# Patient Record
Sex: Female | Born: 1955 | Race: Black or African American | Hispanic: No | Smoking: Former smoker
Health system: Southern US, Community
[De-identification: ages and names within clinical notes are randomized; demographics above are authoritative.]

## PROBLEM LIST (undated history)

## (undated) DIAGNOSIS — I10 Essential (primary) hypertension: Secondary | ICD-10-CM

## (undated) DIAGNOSIS — I200 Unstable angina: Secondary | ICD-10-CM

## (undated) DIAGNOSIS — H409 Unspecified glaucoma: Secondary | ICD-10-CM

## (undated) DIAGNOSIS — N19 Unspecified kidney failure: Secondary | ICD-10-CM

## (undated) DIAGNOSIS — E1169 Type 2 diabetes mellitus with other specified complication: Secondary | ICD-10-CM

## (undated) DIAGNOSIS — H5440 Blindness, one eye, unspecified eye: Secondary | ICD-10-CM

## (undated) DIAGNOSIS — I219 Acute myocardial infarction, unspecified: Secondary | ICD-10-CM

## (undated) DIAGNOSIS — I119 Hypertensive heart disease without heart failure: Secondary | ICD-10-CM

## (undated) DIAGNOSIS — E785 Hyperlipidemia, unspecified: Secondary | ICD-10-CM

## (undated) DIAGNOSIS — E669 Obesity, unspecified: Secondary | ICD-10-CM

## (undated) DIAGNOSIS — I509 Heart failure, unspecified: Secondary | ICD-10-CM

## (undated) HISTORY — PX: CHOLECYSTECTOMY: SHX55

## (undated) HISTORY — PX: EYE SURGERY: SHX253

## (undated) HISTORY — PX: TONSILLECTOMY: SUR1361

## (undated) HISTORY — PX: ABDOMINAL HYSTERECTOMY: SHX81

## (undated) HISTORY — PX: CATARACT EXTRACTION: SUR2

## (undated) HISTORY — DX: Unspecified kidney failure: N19

## (undated) HISTORY — PX: TRIGGER FINGER RELEASE: SHX641

---

## 2008-10-28 ENCOUNTER — Emergency Department (HOSPITAL_BASED_OUTPATIENT_CLINIC_OR_DEPARTMENT_OTHER): Admission: EM | Admit: 2008-10-28 | Discharge: 2008-10-28 | Payer: Self-pay | Admitting: Emergency Medicine

## 2009-02-26 ENCOUNTER — Emergency Department (HOSPITAL_BASED_OUTPATIENT_CLINIC_OR_DEPARTMENT_OTHER): Admission: EM | Admit: 2009-02-26 | Discharge: 2009-02-26 | Payer: Self-pay | Admitting: Emergency Medicine

## 2009-06-10 HISTORY — PX: KIDNEY TRANSPLANT: SHX239

## 2009-08-17 ENCOUNTER — Ambulatory Visit: Payer: Self-pay | Admitting: Diagnostic Radiology

## 2009-08-17 ENCOUNTER — Emergency Department (HOSPITAL_BASED_OUTPATIENT_CLINIC_OR_DEPARTMENT_OTHER): Admission: EM | Admit: 2009-08-17 | Discharge: 2009-08-17 | Payer: Self-pay | Admitting: Emergency Medicine

## 2010-09-03 LAB — BASIC METABOLIC PANEL
BUN: 59 mg/dL — ABNORMAL HIGH (ref 6–23)
Calcium: 9.1 mg/dL (ref 8.4–10.5)
Chloride: 104 mEq/L (ref 96–112)
Creatinine, Ser: 17.4 mg/dL — ABNORMAL HIGH (ref 0.4–1.2)
GFR calc non Af Amer: 2 mL/min — ABNORMAL LOW (ref >60)
Potassium: 4.1 mEq/L (ref 3.5–5.1)
Sodium: 148 mEq/L — ABNORMAL HIGH (ref 135–145)

## 2010-09-03 LAB — DIFFERENTIAL
Basophils Absolute: 0 10*3/uL (ref 0.0–0.1)
Lymphocytes Relative: 22 % (ref 12–46)
Lymphs Abs: 2 10*3/uL (ref 0.7–4.0)
Monocytes Absolute: 0.6 10*3/uL (ref 0.1–1.0)
Neutro Abs: 6.2 10*3/uL (ref 1.7–7.7)

## 2010-09-03 LAB — CBC
MCHC: 34.5 g/dL (ref 30.0–36.0)
RBC: 3.33 MIL/uL — ABNORMAL LOW (ref 3.87–5.11)
RDW: 17.3 % — ABNORMAL HIGH (ref 11.5–15.5)
WBC: 9.1 10*3/uL (ref 4.0–10.5)

## 2010-09-14 LAB — URINALYSIS, ROUTINE W REFLEX MICROSCOPIC
Bilirubin Urine: NEGATIVE
Glucose, UA: 250 mg/dL — AB
Nitrite: NEGATIVE
Protein, ur: >300 mg/dL — AB
Urobilinogen, UA: 0.2 mg/dL (ref 0.0–1.0)

## 2010-09-14 LAB — URINE MICROSCOPIC-ADD ON

## 2011-03-30 ENCOUNTER — Emergency Department (HOSPITAL_BASED_OUTPATIENT_CLINIC_OR_DEPARTMENT_OTHER)
Admission: EM | Admit: 2011-03-30 | Discharge: 2011-03-30 | Disposition: A | Discharge status: Home / Self Care | Payer: Medicare Other | Attending: Emergency Medicine | Admitting: Emergency Medicine

## 2011-03-30 ENCOUNTER — Encounter: Payer: Self-pay | Admitting: Emergency Medicine

## 2011-03-30 DIAGNOSIS — E119 Type 2 diabetes mellitus without complications: Secondary | ICD-10-CM | POA: Insufficient documentation

## 2011-03-30 DIAGNOSIS — I10 Essential (primary) hypertension: Secondary | ICD-10-CM | POA: Insufficient documentation

## 2011-03-30 DIAGNOSIS — M5430 Sciatica, unspecified side: Secondary | ICD-10-CM

## 2011-03-30 HISTORY — DX: Blindness, one eye, unspecified eye: H54.40

## 2011-03-30 HISTORY — DX: Essential (primary) hypertension: I10

## 2011-03-30 MED ORDER — GABAPENTIN 300 MG PO CAPS: 300.0000 mg | ORAL_CAPSULE | Freq: Three times a day (TID) | ORAL | Status: DC

## 2011-03-30 NOTE — ED Provider Notes (Signed)
History     CSN: [id number] Arrival date & time: 03/30/2011  1:25 PM   First MD Initiated Contact with Patient 03/30/11 1326      Chief Complaint  Patient presents with  . Leg Pain    (Consider location/radiation/quality/duration/timing/severity/associated sxs/prior treatment) HPI Comments: Pt states that she was treated with gabapentin and it helped, but she is out of the medication  Patient is a 55 y.o. female presenting with leg pain. The history is provided by the patient. No language interpreter was used.  Leg Pain  The incident occurred more than 1 week ago. The injury mechanism is unknown. Pain location: right buttock down to the knee. The quality of the pain is described as throbbing. The pain is moderate. The pain has been intermittent since onset. Pertinent negatives include no numbness, no loss of motion and no tingling. She reports no foreign bodies present. The symptoms are aggravated by palpation.    Past Medical History  Diagnosis Date  . Renal disorder     kidney transplant  . Hypertension   . Diabetes mellitus   . Blind left eye   . Glaucoma     Past Surgical History  Procedure Date  . Nephrectomy transplanted organ 10/2010  . Cholecystectomy   . Abdominal hysterectomy   . Tonsillectomy   . Eye surgery     cataract    History reviewed. No pertinent family history.  History  Substance Use Topics  . Smoking status: Never Smoker   . Smokeless tobacco: Not on file  . Alcohol Use: No    OB History    Grav Para Term Preterm Abortions TAB SAB Ect Mult Living                  Review of Systems  Neurological: Negative for tingling and numbness.  All other systems reviewed and are negative.    Allergies  Iron and Wellbutrin  Home Medications   Current Outpatient Rx  Name Route Sig Dispense Refill  . ASPIRIN 81 MG PO CHEW Oral Chew 81 mg by mouth daily.      . INSULIN ASPART 100 UNIT/ML [place] SOLN Subcutaneous Inject into the skin 3 (three)  times daily before meals.      . INSULIN GLARGINE 100 UNIT/ML [place] SOLN Subcutaneous Inject into the skin at bedtime.      . ISOSORBIDE MONONITRATE 20 MG PO TABS Oral Take 20 mg by mouth 3 (three) times daily.      Marland Kitchen LABETALOL HCL 300 MG PO TABS Oral Take 300 mg by mouth 3 (three) times daily.      Marland Kitchen MYCOPHENOLATE SODIUM 180 MG PO TBEC Oral Take 180 mg by mouth 4 (four) times daily.      Marland Kitchen PANTOPRAZOLE SODIUM 20 MG PO TBEC Oral Take 20 mg by mouth daily.      . SODIUM BICARBONATE 325 MG PO TABS Oral Take 325 mg by mouth 4 (four) times daily.      . SUCRALFATE 1 G PO TABS Oral Take 1 g by mouth 4 (four) times daily.      . SULFAMETHOXAZOLE-TRIMETHOPRIM 400-80 MG PO TABS Oral Take 1 tablet by mouth 2 (two) times a week.      Marland Kitchen TACROLIMUS 1 MG PO CAPS Oral Take 6 mg by mouth 2 (two) times daily.      Marland Kitchen GABAPENTIN 300 MG PO CAPS Oral Take 1 capsule (300 mg total) by mouth 3 (three) times daily. 20 capsule 0  BP 155/58  Pulse 83  Temp(Src) 98.6 F (37 C) (Oral)  Resp 22  Ht 5\' 4"  (1.626 m)  Wt 220 lb (99.791 kg)  BMI 37.76 kg/m2  SpO2 100%  Physical Exam  Nursing note and vitals reviewed. Constitutional: She is oriented to person, place, and time. She appears well-developed and well-nourished.  Cardiovascular: Normal rate and regular rhythm.   Pulmonary/Chest: Effort normal and breath sounds normal.  Musculoskeletal: Normal range of motion.       Pt tender in the right sciatic notch  Neurological: She is alert and oriented to person, place, and time.  Skin: Skin is warm and dry.    ED Course  Procedures (including critical care time)  Labs Reviewed - No data to display No results found.   1. Sciatica       MDM  Exam consistent with sciatica:pt was getting results with gabapentin:will continue to give for pain:pt given referral         Glendell Docker, NP 03/30/11 1403

## 2011-03-30 NOTE — ED Notes (Signed)
Pt states she was treated for migraine approximately one month ago with bilateral injections.  Pt relates that her right leg has been hurting from injection site down to knee ever since.  Pt seen at Urgent care approximately two weeks ago.  Pt is out of medications, and wants to be checked again.

## 2011-03-30 NOTE — ED Provider Notes (Signed)
History/physical exam/procedure(s) were performed by non-physician practitioner and as supervising physician I was immediately available for consultation/collaboration. I have reviewed all notes and am in agreement with care and plan.   Shaune Pollack, MD 03/30/11 [phone]

## 2011-04-02 ENCOUNTER — Encounter: Payer: Self-pay | Admitting: Family Medicine

## 2011-04-02 ENCOUNTER — Ambulatory Visit (INDEPENDENT_AMBULATORY_CARE_PROVIDER_SITE_OTHER): Payer: Medicare Other | Admitting: Family Medicine

## 2011-04-02 VITALS — BP 177/79 | HR 94 | Temp 98.4°F | Ht 64.0 in | Wt 220.0 lb

## 2011-04-02 DIAGNOSIS — M549 Dorsalgia, unspecified: Secondary | ICD-10-CM

## 2011-04-02 DIAGNOSIS — M545 Low back pain: Secondary | ICD-10-CM

## 2011-04-02 MED ORDER — ACETAMINOPHEN-CODEINE #3 300-30 MG PO TABS: 1.0000 | ORAL_TABLET | Freq: Four times a day (QID) | ORAL | Status: AC | PRN

## 2011-04-02 MED ORDER — GABAPENTIN 300 MG PO CAPS: 300.0000 mg | ORAL_CAPSULE | Freq: Three times a day (TID) | ORAL | Status: DC

## 2011-04-02 NOTE — Patient Instructions (Signed)
Your exam is consistent with sciatica with piriformis spasms vs lumbar radiculopathy (pinched nerve in your back). Your medication options are limited with your stomach and kidney history. We will move forward with an MRI of your lumbar spine to further assess since you haven't gotten better with usual measures and pain is severe. Try Tylenol with codeine for pain relief. Take neurontin 2 caps three times a day for nerve pain. Prednisone is an option but this should be prescribed by your kidney physician only after discussion of risks. Flexeril as needed for muscle spasms (no driving on this medicine). Stay as active as possible. Physical therapy has been shown to be helpful as well - start this while we're waiting on MRI results to help break up spasms, stretch out area, and decrease pain. I will call you with the MRI results.

## 2011-04-04 ENCOUNTER — Encounter: Payer: Self-pay | Admitting: Family Medicine

## 2011-04-04 DIAGNOSIS — M545 Low back pain: Secondary | ICD-10-CM | POA: Insufficient documentation

## 2011-04-04 NOTE — Progress Notes (Signed)
Subjective:    Patient ID: April Pena, female    DOB: 02/23/1956, 55 y.o.   MRN: [id number]  PCP: Dr. Ellouise Newer  HPI 55 yo F here for 1 month of low back pain.  Patient reports pain started about 1 month ago when she received an IM injection in right buttock for a migraine. States since then has had fairly persistent pain in right side low back/buttock radiating down posterior leg to knee. Pain also goes into groin and across back. Saw PCP for this and given gabapentin which helped. Taking tylenol as well. Heating pad makes this ache more. No numbness or tingling with the pain. Options limited 2/2 being s/p kidney transplant and on anti-rejection drugs. She runs all medications by her kidney specialist. Pain is 9/10. No bowel/bladder dysfunction. No prior back issues.  Past Medical History  Diagnosis Date  . Renal disorder     kidney transplant  . Hypertension   . Diabetes mellitus   . Blind left eye   . Glaucoma     Current Outpatient Prescriptions on File Prior to Visit  Medication Sig Dispense Refill  . acetaminophen (TYLENOL) 500 MG tablet Take 500 mg by mouth every 6 (six) hours as needed. As needed for pain       . aspirin 81 MG chewable tablet Chew 81 mg by mouth daily.       . insulin aspart (NOVOLOG) 100 UNIT/ML injection Inject into the skin 3 (three) times daily before meals. New to pt unsure about scale      . insulin glargine (LANTUS) 100 UNIT/ML injection Inject into the skin at bedtime.       . isosorbide mononitrate (IMDUR) 60 MG 24 hr tablet Take 60 mg by mouth daily.        . isosorbide mononitrate (ISMO,MONOKET) 20 MG tablet Take 20 mg by mouth 3 (three) times daily.       Marland Kitchen labetalol (NORMODYNE) 300 MG tablet Take 300 mg by mouth 3 (three) times daily. Morning (930  Noon and at 5:30      . magnesium oxide (MAG-OX) 400 MG tablet Take 400 mg by mouth 2 (two) times daily.        . mycophenolate (MYFORTIC) 180 MG EC tablet Take 180 mg by mouth 4 (four)  times daily.       . pantoprazole (PROTONIX) 20 MG tablet Take 20 mg by mouth daily.       . predniSONE (DELTASONE) 5 MG tablet Take 5 mg by mouth daily.        . QUEtiapine (SEROQUEL) 25 MG tablet Take 25 mg by mouth at bedtime.        . sodium bicarbonate 325 MG tablet Take 325 mg by mouth 2 (two) times daily.       . sucralfate (CARAFATE) 1 G tablet Take 1 g by mouth 2 (two) times daily.       Marland Kitchen sulfamethoxazole-trimethoprim (BACTRIM,SEPTRA) 400-80 MG per tablet Take 1 tablet by mouth 3 (three) times a week. Takes on Mon Wed and Friday      . tacrolimus (PROGRAF) 1 MG capsule Take 6 mg by mouth daily with breakfast.       . tacrolimus (PROGRAF) 1 MG capsule Take 5 mg by mouth at bedtime.        . timolol (BETIMOL) 0.5 % ophthalmic solution Place 2 drops into the right eye 2 (two) times daily.        . traZODone (DESYREL) 100 MG tablet  Take 100 mg by mouth at bedtime.          Past Surgical History  Procedure Date  . Nephrectomy transplanted organ 10/2010  . Cholecystectomy   . Abdominal hysterectomy   . Tonsillectomy   . Eye surgery     cataract  . Kidney transplant     Allergies  Allergen Reactions  . Iron Hives and Other (See Comments)    Elevated blood sugar, glaucoma worsened,   . Wellbutrin (Bupropion Hcl) Nausea And Vomiting    History   Social History  . Marital Status: Divorced    Spouse Name: N/A    Number of Children: N/A  . Years of Education: N/A   Occupational History  . Not on file.   Social History Main Topics  . Smoking status: Never Smoker   . Smokeless tobacco: Not on file  . Alcohol Use: No  . Drug Use: Not on file  . Sexually Active: Not on file   Other Topics Concern  . Not on file   Social History Narrative  . No narrative on file    Family History  Problem Relation Age of Onset  . Diabetes Mother   . Hypertension Mother   . Heart attack Mother   . Diabetes Father   . Hypertension Sister   . Diabetes Brother   . Hypertension  Brother   . Diabetes Paternal Grandmother   . Hyperlipidemia Neg Hx   . Sudden death Neg Hx   . Diabetes Daughter   . Hypertension Daughter     BP 177/79  Pulse 94  Temp(Src) 98.4 F (36.9 C) (Oral)  Ht 5\' 4"  (1.626 m)  Wt 220 lb (99.791 kg)  BMI 37.76 kg/m2  Review of Systems See HPI above.    Objective:   Physical Exam Gen: NAD  Back: No gross deformity, scoliosis. TTP right lumbar paraspinal region, buttock, and at piriformis.  No midline or bony TTP. FROM with pain on full flexion. Strength 4/5 with right hip flexion and knee extension.  Otherwise 5/5 all lower extremity muscle groups.  2+ MSRs in patellar and achilles tendons, equal bilaterally. Positive SLR on right. Sensation intact to light touch bilaterally. Negative logroll bilateral hips Mild pain with right piriformis stretch.  Negative fabers.    Assessment & Plan:  1. Low back pain - Patient reports pain started with IM injection a month ago but she has symptoms more indicative of lumbar radiculopathy and the area where she was injected was in upper outer quadrant of right buttock, not near piriformis/sciatic nerve - believe this is most likely coincidental.  Will treat for lumbar radiculopathy but also do PT for piriformis spasms/sciatica.  Refill neurontin but increase dosage to 600 tid.  Try tylenol with codeine instead of her usual percocet - stressed that she should not take both at same time but she should choose between them.  She has a Equities trader with another physician - discussed this and she said it would not be a violation of her contract.  We do not refill these narcotic pain medications however.  Severity of pain along with muscle weakness is concerning - will move forward with MRI of lumbar spine to further assess.  While she could do prednisone higher dose, advised her she should run this by her renal specialist first.  Start flexeril as needed for spasms.

## 2011-04-04 NOTE — Assessment & Plan Note (Signed)
Patient reports pain started with IM injection a month ago but she has symptoms more indicative of lumbar radiculopathy and the area where she was injected was in upper outer quadrant of right buttock, not near piriformis/sciatic nerve - believe this is most likely coincidental.  Will treat for lumbar radiculopathy but also do PT for piriformis spasms/sciatica.  Refill neurontin but increase dosage to 600 tid.  Try tylenol with codeine instead of her usual percocet - stressed that she should not take both at same time but she should choose between them.  She has a Equities trader with another physician - discussed this and she said it would not be a violation of her contract.  We do not refill these narcotic pain medications however.  Severity of pain along with muscle weakness is concerning - will move forward with MRI of lumbar spine to further assess.  While she could do prednisone higher dose, advised her she should run this by her renal specialist first.  Start flexeril as needed for spasms.

## 2011-04-06 ENCOUNTER — Other Ambulatory Visit (HOSPITAL_BASED_OUTPATIENT_CLINIC_OR_DEPARTMENT_OTHER): Payer: Medicare Other

## 2011-04-08 ENCOUNTER — Ambulatory Visit: Payer: Medicare Other | Attending: Family Medicine | Admitting: Physical Therapy

## 2011-04-08 DIAGNOSIS — M25559 Pain in unspecified hip: Secondary | ICD-10-CM | POA: Insufficient documentation

## 2011-04-08 DIAGNOSIS — IMO0001 Reserved for inherently not codable concepts without codable children: Secondary | ICD-10-CM | POA: Insufficient documentation

## 2011-04-08 DIAGNOSIS — M2569 Stiffness of other specified joint, not elsewhere classified: Secondary | ICD-10-CM | POA: Insufficient documentation

## 2016-04-07 ENCOUNTER — Encounter (HOSPITAL_BASED_OUTPATIENT_CLINIC_OR_DEPARTMENT_OTHER): Payer: Self-pay | Admitting: *Deleted

## 2016-04-07 ENCOUNTER — Inpatient Hospital Stay (HOSPITAL_BASED_OUTPATIENT_CLINIC_OR_DEPARTMENT_OTHER)
Admission: EM | Admit: 2016-04-07 | Discharge: 2016-04-19 | DRG: 234 | Disposition: A | Discharge status: Home / Self Care | Payer: Medicaid Other | Attending: Surgery | Admitting: Surgery

## 2016-04-07 ENCOUNTER — Emergency Department (HOSPITAL_BASED_OUTPATIENT_CLINIC_OR_DEPARTMENT_OTHER): Payer: Medicaid Other

## 2016-04-07 DIAGNOSIS — N183 Chronic kidney disease, stage 3 unspecified: Secondary | ICD-10-CM | POA: Diagnosis present

## 2016-04-07 DIAGNOSIS — E1122 Type 2 diabetes mellitus with diabetic chronic kidney disease: Secondary | ICD-10-CM | POA: Diagnosis present

## 2016-04-07 DIAGNOSIS — R7989 Other specified abnormal findings of blood chemistry: Secondary | ICD-10-CM

## 2016-04-07 DIAGNOSIS — Z951 Presence of aortocoronary bypass graft: Secondary | ICD-10-CM

## 2016-04-07 DIAGNOSIS — S3012XA Contusion of groin, initial encounter: Secondary | ICD-10-CM | POA: Diagnosis not present

## 2016-04-07 DIAGNOSIS — E1143 Type 2 diabetes mellitus with diabetic autonomic (poly)neuropathy: Secondary | ICD-10-CM | POA: Diagnosis present

## 2016-04-07 DIAGNOSIS — R778 Other specified abnormalities of plasma proteins: Secondary | ICD-10-CM

## 2016-04-07 DIAGNOSIS — I97630 Postprocedural hematoma of a circulatory system organ or structure following a cardiac catheterization: Secondary | ICD-10-CM | POA: Diagnosis not present

## 2016-04-07 DIAGNOSIS — M549 Dorsalgia, unspecified: Secondary | ICD-10-CM

## 2016-04-07 DIAGNOSIS — I2582 Chronic total occlusion of coronary artery: Secondary | ICD-10-CM | POA: Diagnosis present

## 2016-04-07 DIAGNOSIS — Z794 Long term (current) use of insulin: Secondary | ICD-10-CM

## 2016-04-07 DIAGNOSIS — E119 Type 2 diabetes mellitus without complications: Secondary | ICD-10-CM | POA: Diagnosis present

## 2016-04-07 DIAGNOSIS — I2510 Atherosclerotic heart disease of native coronary artery without angina pectoris: Secondary | ICD-10-CM

## 2016-04-07 DIAGNOSIS — K3184 Gastroparesis: Secondary | ICD-10-CM | POA: Diagnosis present

## 2016-04-07 DIAGNOSIS — I252 Old myocardial infarction: Secondary | ICD-10-CM

## 2016-04-07 DIAGNOSIS — I2584 Coronary atherosclerosis due to calcified coronary lesion: Secondary | ICD-10-CM | POA: Diagnosis present

## 2016-04-07 DIAGNOSIS — I214 Non-ST elevation (NSTEMI) myocardial infarction: Principal | ICD-10-CM | POA: Diagnosis present

## 2016-04-07 DIAGNOSIS — E1169 Type 2 diabetes mellitus with other specified complication: Secondary | ICD-10-CM | POA: Diagnosis present

## 2016-04-07 DIAGNOSIS — H409 Unspecified glaucoma: Secondary | ICD-10-CM | POA: Diagnosis present

## 2016-04-07 DIAGNOSIS — R079 Chest pain, unspecified: Secondary | ICD-10-CM

## 2016-04-07 DIAGNOSIS — N289 Disorder of kidney and ureter, unspecified: Secondary | ICD-10-CM

## 2016-04-07 DIAGNOSIS — Z87891 Personal history of nicotine dependence: Secondary | ICD-10-CM

## 2016-04-07 DIAGNOSIS — N189 Chronic kidney disease, unspecified: Secondary | ICD-10-CM

## 2016-04-07 DIAGNOSIS — E871 Hypo-osmolality and hyponatremia: Secondary | ICD-10-CM | POA: Diagnosis not present

## 2016-04-07 DIAGNOSIS — Z79899 Other long term (current) drug therapy: Secondary | ICD-10-CM

## 2016-04-07 DIAGNOSIS — Z7982 Long term (current) use of aspirin: Secondary | ICD-10-CM

## 2016-04-07 DIAGNOSIS — Z940 Kidney transplant status: Secondary | ICD-10-CM

## 2016-04-07 DIAGNOSIS — H5462 Unqualified visual loss, left eye, normal vision right eye: Secondary | ICD-10-CM | POA: Diagnosis present

## 2016-04-07 DIAGNOSIS — Y848 Other medical procedures as the cause of abnormal reaction of the patient, or of later complication, without mention of misadventure at the time of the procedure: Secondary | ICD-10-CM | POA: Diagnosis not present

## 2016-04-07 DIAGNOSIS — Z6835 Body mass index (BMI) 35.0-35.9, adult: Secondary | ICD-10-CM

## 2016-04-07 DIAGNOSIS — E11649 Type 2 diabetes mellitus with hypoglycemia without coma: Secondary | ICD-10-CM | POA: Diagnosis not present

## 2016-04-07 DIAGNOSIS — E785 Hyperlipidemia, unspecified: Secondary | ICD-10-CM | POA: Diagnosis present

## 2016-04-07 DIAGNOSIS — I130 Hypertensive heart and chronic kidney disease with heart failure and stage 1 through stage 4 chronic kidney disease, or unspecified chronic kidney disease: Secondary | ICD-10-CM | POA: Diagnosis present

## 2016-04-07 DIAGNOSIS — E669 Obesity, unspecified: Secondary | ICD-10-CM | POA: Diagnosis present

## 2016-04-07 DIAGNOSIS — E860 Dehydration: Secondary | ICD-10-CM | POA: Diagnosis not present

## 2016-04-07 DIAGNOSIS — Z7952 Long term (current) use of systemic steroids: Secondary | ICD-10-CM

## 2016-04-07 DIAGNOSIS — S301XXA Contusion of abdominal wall, initial encounter: Secondary | ICD-10-CM | POA: Diagnosis not present

## 2016-04-07 DIAGNOSIS — I10 Essential (primary) hypertension: Secondary | ICD-10-CM | POA: Diagnosis present

## 2016-04-07 DIAGNOSIS — K5900 Constipation, unspecified: Secondary | ICD-10-CM | POA: Diagnosis not present

## 2016-04-07 DIAGNOSIS — I509 Heart failure, unspecified: Secondary | ICD-10-CM | POA: Diagnosis present

## 2016-04-07 HISTORY — DX: Acute myocardial infarction, unspecified: I21.9

## 2016-04-07 HISTORY — DX: Unspecified glaucoma: H40.9

## 2016-04-07 HISTORY — DX: Hypertensive heart disease without heart failure: I11.9

## 2016-04-07 HISTORY — DX: Obesity, unspecified: E66.9

## 2016-04-07 HISTORY — DX: Type 2 diabetes mellitus with other specified complication: E11.69

## 2016-04-07 HISTORY — DX: Hyperlipidemia, unspecified: E78.5

## 2016-04-07 HISTORY — DX: Heart failure, unspecified: I50.9

## 2016-04-07 HISTORY — DX: Unstable angina: I20.0

## 2016-04-07 LAB — GLUCOSE, CAPILLARY
GLUCOSE-CAPILLARY: 164 mg/dL — AB (ref 65–99)
Glucose-Capillary: 367 mg/dL — ABNORMAL HIGH (ref 65–99)

## 2016-04-07 LAB — HEPARIN LEVEL (UNFRACTIONATED): HEPARIN UNFRACTIONATED: 0.55 [IU]/mL (ref 0.30–0.70)

## 2016-04-07 LAB — CBC WITH DIFFERENTIAL/PLATELET
Basophils Absolute: 0 10*3/uL (ref 0.0–0.1)
Basophils Relative: 0 %
EOS ABS: 0.1 10*3/uL (ref 0.0–0.7)
Eosinophils Relative: 2 %
HCT: 33.9 % — ABNORMAL LOW (ref 36.0–46.0)
HEMOGLOBIN: 10.7 g/dL — AB (ref 12.0–15.0)
LYMPHS ABS: 0.7 10*3/uL (ref 0.7–4.0)
Lymphocytes Relative: 15 %
MCH: 28 pg (ref 26.0–34.0)
MCHC: 31.6 g/dL (ref 30.0–36.0)
MCV: 88.7 fL (ref 78.0–100.0)
MONO ABS: 0.5 10*3/uL (ref 0.1–1.0)
MONOS PCT: 11 %
NEUTROS PCT: 72 %
Neutro Abs: 3.4 10*3/uL (ref 1.7–7.7)
Platelets: 222 10*3/uL (ref 150–400)
RBC: 3.82 MIL/uL — ABNORMAL LOW (ref 3.87–5.11)
RDW: 13.6 % (ref 11.5–15.5)
WBC: 4.8 10*3/uL (ref 4.0–10.5)

## 2016-04-07 LAB — BASIC METABOLIC PANEL
Anion gap: 8 (ref 5–15)
BUN: 36 mg/dL — AB (ref 6–20)
CALCIUM: 9.2 mg/dL (ref 8.9–10.3)
CHLORIDE: 108 mmol/L (ref 101–111)
CO2: 21 mmol/L — AB (ref 22–32)
CREATININE: 1.85 mg/dL — AB (ref 0.44–1.00)
GFR calc Af Amer: 33 mL/min — ABNORMAL LOW (ref >60)
GFR calc non Af Amer: 29 mL/min — ABNORMAL LOW (ref >60)
Glucose, Bld: 248 mg/dL — ABNORMAL HIGH (ref 65–99)
Potassium: 3.7 mmol/L (ref 3.5–5.1)
SODIUM: 137 mmol/L (ref 135–145)

## 2016-04-07 LAB — TROPONIN I
TROPONIN I: 3.32 ng/mL — AB (ref <0.03)
Troponin I: 0.73 ng/mL (ref <0.03)
Troponin I: 3.72 ng/mL (ref <0.03)

## 2016-04-07 LAB — CBG MONITORING, ED: Glucose-Capillary: 56 mg/dL — ABNORMAL LOW (ref 65–99)

## 2016-04-07 LAB — PROTIME-INR
INR: 1.02
Prothrombin Time: 13.4 seconds (ref 11.4–15.2)

## 2016-04-07 LAB — MRSA PCR SCREENING: MRSA by PCR: NEGATIVE

## 2016-04-07 MED ORDER — INSULIN ASPART 100 UNIT/ML ~~LOC~~ SOLN
10.0000 [IU] | Freq: Three times a day (TID) | SUBCUTANEOUS | Status: DC
  Administered 2016-04-08 – 2016-04-09 (×4): 10 [IU] via SUBCUTANEOUS

## 2016-04-07 MED ORDER — CLOPIDOGREL BISULFATE 75 MG PO TABS
75.0000 mg | ORAL_TABLET | Freq: Every day | ORAL | Status: DC
  Administered 2016-04-08: 75 mg via ORAL
  Filled 2016-04-07: qty 1

## 2016-04-07 MED ORDER — SODIUM BICARBONATE 650 MG PO TABS
650.0000 mg | ORAL_TABLET | Freq: Two times a day (BID) | ORAL | Status: DC
  Administered 2016-04-07 – 2016-04-12 (×10): 650 mg via ORAL
  Filled 2016-04-07 (×10): qty 1

## 2016-04-07 MED ORDER — ACETAMINOPHEN 325 MG PO TABS: 650.0000 mg | ORAL_TABLET | ORAL | Status: DC | PRN

## 2016-04-07 MED ORDER — PANTOPRAZOLE SODIUM 40 MG PO TBEC
40.0000 mg | DELAYED_RELEASE_TABLET | Freq: Every day | ORAL | Status: DC
  Administered 2016-04-09 – 2016-04-12 (×4): 40 mg via ORAL
  Filled 2016-04-07 (×2): qty 1
  Filled 2016-04-07: qty 2

## 2016-04-07 MED ORDER — DORZOLAMIDE HCL-TIMOLOL MAL 2-0.5 % OP SOLN
1.0000 [drp] | Freq: Two times a day (BID) | OPHTHALMIC | Status: DC
  Administered 2016-04-07 – 2016-04-08 (×2): 1 [drp] via OPHTHALMIC
  Filled 2016-04-07: qty 10

## 2016-04-07 MED ORDER — SULFAMETHOXAZOLE-TRIMETHOPRIM 400-80 MG PO TABS
1.0000 | ORAL_TABLET | ORAL | Status: DC
  Administered 2016-04-08 – 2016-04-19 (×6): 1 via ORAL
  Filled 2016-04-07 (×7): qty 1

## 2016-04-07 MED ORDER — PREDNISOLONE ACETATE 1 % OP SUSP
1.0000 [drp] | Freq: Four times a day (QID) | OPHTHALMIC | Status: DC | PRN
  Administered 2016-04-10: 1 [drp] via OPHTHALMIC
  Filled 2016-04-07: qty 1

## 2016-04-07 MED ORDER — DEXTROSE 50 % IV SOLN
1.0000 | Freq: Once | INTRAVENOUS | Status: AC
  Administered 2016-04-07: 50 mL via INTRAVENOUS
  Filled 2016-04-07: qty 50

## 2016-04-07 MED ORDER — ACETAMINOPHEN 500 MG PO TABS
500.0000 mg | ORAL_TABLET | Freq: Four times a day (QID) | ORAL | Status: DC | PRN
  Administered 2016-04-08 – 2016-04-11 (×3): 500 mg via ORAL
  Filled 2016-04-07 (×3): qty 1

## 2016-04-07 MED ORDER — ATORVASTATIN CALCIUM 20 MG PO TABS
20.0000 mg | ORAL_TABLET | Freq: Every day | ORAL | Status: DC
  Administered 2016-04-07: 20 mg via ORAL
  Filled 2016-04-07: qty 1

## 2016-04-07 MED ORDER — METOPROLOL SUCCINATE ER 100 MG PO TB24
100.0000 mg | ORAL_TABLET | Freq: Every day | ORAL | Status: DC
  Administered 2016-04-08: 100 mg via ORAL
  Filled 2016-04-07: qty 1

## 2016-04-07 MED ORDER — PREDNISONE 10 MG PO TABS
5.0000 mg | ORAL_TABLET | Freq: Every day | ORAL | Status: DC
  Administered 2016-04-08 – 2016-04-12 (×5): 5 mg via ORAL
  Filled 2016-04-07 (×5): qty 1

## 2016-04-07 MED ORDER — OXYCODONE-ACETAMINOPHEN 7.5-325 MG PO TABS: 1.0000 | ORAL_TABLET | Freq: Two times a day (BID) | ORAL | Status: DC

## 2016-04-07 MED ORDER — HEPARIN SODIUM (PORCINE) 5000 UNIT/ML IJ SOLN: 4000.0000 [IU] | Freq: Once | INTRAMUSCULAR | Status: DC

## 2016-04-07 MED ORDER — HEPARIN (PORCINE) IN NACL 100-0.45 UNIT/ML-% IJ SOLN
1000.0000 [IU]/h | INTRAMUSCULAR | Status: DC
  Administered 2016-04-07: 1000 [IU]/h via INTRAVENOUS
  Filled 2016-04-07 (×2): qty 250

## 2016-04-07 MED ORDER — NITROGLYCERIN 0.4 MG SL SUBL: 0.4000 mg | SUBLINGUAL_TABLET | SUBLINGUAL | Status: DC | PRN

## 2016-04-07 MED ORDER — TACROLIMUS 1 MG PO CAPS: 2.0000 mg | ORAL_CAPSULE | ORAL | Status: DC

## 2016-04-07 MED ORDER — ASPIRIN 81 MG PO CHEW
324.0000 mg | CHEWABLE_TABLET | ORAL | Status: AC
  Administered 2016-04-07: 324 mg via ORAL
  Filled 2016-04-07: qty 4

## 2016-04-07 MED ORDER — TACROLIMUS 1 MG PO CAPS
2.0000 mg | ORAL_CAPSULE | Freq: Every day | ORAL | Status: DC
  Administered 2016-04-07 – 2016-04-18 (×12): 2 mg via ORAL
  Filled 2016-04-07 (×12): qty 2

## 2016-04-07 MED ORDER — GABAPENTIN 300 MG PO CAPS
300.0000 mg | ORAL_CAPSULE | Freq: Two times a day (BID) | ORAL | Status: DC
  Administered 2016-04-07 – 2016-04-08 (×2): 300 mg via ORAL
  Filled 2016-04-07 (×2): qty 1

## 2016-04-07 MED ORDER — ONDANSETRON HCL 4 MG/2ML IJ SOLN: 4.0000 mg | Freq: Four times a day (QID) | INTRAMUSCULAR | Status: DC | PRN

## 2016-04-07 MED ORDER — HEPARIN BOLUS VIA INFUSION
4000.0000 [IU] | Freq: Once | INTRAVENOUS | Status: AC
  Administered 2016-04-07: 4000 [IU] via INTRAVENOUS

## 2016-04-07 MED ORDER — ASPIRIN 300 MG RE SUPP: 300.0000 mg | RECTAL | Status: AC

## 2016-04-07 MED ORDER — ASPIRIN EC 81 MG PO TBEC: 81.0000 mg | DELAYED_RELEASE_TABLET | Freq: Every day | ORAL | Status: DC

## 2016-04-07 MED ORDER — MORPHINE SULFATE (PF) 2 MG/ML IV SOLN
2.0000 mg | Freq: Once | INTRAVENOUS | Status: AC
  Administered 2016-04-07: 2 mg via INTRAVENOUS
  Filled 2016-04-07: qty 1

## 2016-04-07 MED ORDER — OXYCODONE-ACETAMINOPHEN 7.5-325 MG PO TABS: 1.0000 | ORAL_TABLET | Freq: Two times a day (BID) | ORAL | Status: DC | PRN

## 2016-04-07 MED ORDER — AMLODIPINE BESYLATE 10 MG PO TABS
10.0000 mg | ORAL_TABLET | Freq: Every day | ORAL | Status: DC
  Administered 2016-04-08 – 2016-04-12 (×5): 10 mg via ORAL
  Filled 2016-04-07 (×5): qty 1

## 2016-04-07 MED ORDER — MAGNESIUM OXIDE 400 (241.3 MG) MG PO TABS
400.0000 mg | ORAL_TABLET | Freq: Two times a day (BID) | ORAL | Status: DC
  Administered 2016-04-07 – 2016-04-12 (×10): 400 mg via ORAL
  Filled 2016-04-07 (×12): qty 1

## 2016-04-07 MED ORDER — INSULIN DETEMIR 100 UNIT/ML ~~LOC~~ SOLN
10.0000 [IU] | Freq: Every day | SUBCUTANEOUS | Status: DC
  Administered 2016-04-07 – 2016-04-09 (×3): 10 [IU] via SUBCUTANEOUS
  Filled 2016-04-07 (×4): qty 0.1

## 2016-04-07 MED ORDER — MYCOPHENOLATE SODIUM 180 MG PO TBEC
360.0000 mg | DELAYED_RELEASE_TABLET | Freq: Two times a day (BID) | ORAL | Status: DC
  Administered 2016-04-07 – 2016-04-12 (×10): 360 mg via ORAL
  Filled 2016-04-07 (×11): qty 2

## 2016-04-07 MED ORDER — ASPIRIN EC 81 MG PO TBEC
81.0000 mg | DELAYED_RELEASE_TABLET | Freq: Every day | ORAL | Status: DC
  Administered 2016-04-09 – 2016-04-12 (×4): 81 mg via ORAL
  Filled 2016-04-07 (×5): qty 1

## 2016-04-07 MED ORDER — TACROLIMUS 1 MG PO CAPS
3.0000 mg | ORAL_CAPSULE | Freq: Every day | ORAL | Status: AC
  Administered 2016-04-08 – 2016-04-17 (×10): 3 mg via ORAL
  Filled 2016-04-07 (×10): qty 3

## 2016-04-07 MED ORDER — ISOSORBIDE MONONITRATE ER 30 MG PO TB24
60.0000 mg | ORAL_TABLET | Freq: Every day | ORAL | Status: DC
  Administered 2016-04-08 – 2016-04-12 (×5): 60 mg via ORAL
  Filled 2016-04-07 (×5): qty 1

## 2016-04-07 NOTE — Progress Notes (Signed)
[facility] for Heparin Indication: chest pain/ACS  Allergies  Allergen Reactions  . Iron Hives and Other (See Comments)    Elevated blood sugar, glaucoma worsened,   . Promethazine Other (See Comments)    Pt states her muscles jerk  . Wellbutrin [Bupropion Hcl] Nausea And Vomiting    Patient Measurements: Height: 5\' 3"  (160 cm) Weight: 200 lb 9.9 oz (91 kg) IBW/kg (Calculated) : 52.4 Heparin Dosing Weight: 74.4 kg  Vital Signs: Temp: 98.3 F (36.8 C) (10/29 1957) Temp Source: Oral (10/29 1957) BP: 134/66 (10/29 1957) Pulse Rate: 72 (10/29 1957)  Labs:  Recent Labs  04/07/16 1105 04/07/16 1713 04/07/16 1904  HGB 10.7*  --   --   HCT 33.9*  --   --   PLT 222  --   --   LABPROT 13.4  --   --   INR 1.02  --   --   HEPARINUNFRC  --   --  0.55  CREATININE 1.85*  --   --   TROPONINI 0.73* 3.72*  --     Estimated Creatinine Clearance: 34.6 mL/min (by C-G formula based on SCr of 1.85 mg/dL (H)).  Infusions:  . heparin 1,000 Units/hr (04/07/16 1800)   Assessment:  60 year old female in ED at [facility] with chest pain to start IV heparin per pharmacy consult. Troponin trending up. Initial heparin level is therapeutic at 0.55. No bleeding noted.   Goal of Therapy:  Heparin level 0.3-0.7 units/ml Monitor platelets by anticoagulation protocol: Yes   Plan:  Continue heparin gtt at 1000 units/hr  Confirm dosing with AM level Daily heparin level and CBC.   Salome Arnt, PharmD, BCPS Pager # [phone] 04/07/2016 8:04 PM

## 2016-04-07 NOTE — ED Notes (Signed)
Pt reports taking her Imdur this morning.

## 2016-04-07 NOTE — Progress Notes (Signed)
ANTICOAGULATION CONSULT NOTE - Initial Consult  Pharmacy Consult for Heparin Indication: chest pain/ACS  Allergies  Allergen Reactions  . Iron Hives and Other (See Comments)    Elevated blood sugar, glaucoma worsened,   . Wellbutrin [Bupropion Hcl] Nausea And Vomiting    Patient Measurements: Height: 5\' 4"  (162.6 cm) Weight: 195 lb (88.5 kg) IBW/kg (Calculated) : 54.7 Heparin Dosing Weight: 74.4 kg  Vital Signs: Temp: 98.5 F (36.9 C) (10/29 1055) Temp Source: Oral (10/29 1055) BP: 126/73 (10/29 1212) Pulse Rate: 62 (10/29 1212)  Labs:  Recent Labs  04/07/16 1105  HGB 10.7*  HCT 33.9*  PLT 222  LABPROT 13.4  INR 1.02  CREATININE 1.85*  TROPONINI 0.73*    Estimated Creatinine Clearance: 34.8 mL/min (by C-G formula based on SCr of 1.85 mg/dL (H)).   Medical History: Past Medical History:  Diagnosis Date  . Blind left eye   . Blood transfusion without reported diagnosis   . CHF (congestive heart failure) ([place])   . Diabetes mellitus   . Glaucoma   . Hypertension   . MI (myocardial infarction)   . Renal disorder    kidney transplant  . Unstable angina ([facility])     Medications:  Scheduled:  . heparin  4,000 Units Intravenous Once  .  morphine injection  2 mg Intravenous Once   Infusions:    Assessment:  60 year old female in ED at [facility] with chest pain to start IV heparin per pharmacy consult. Troponin 0.73.   Heparin 4000 unit bolus ordered - INR 1.02 baseline. H/H 10.7/33.9, Plts 222 SCr 1.85, CrCl 30-30mL/min. Not on anticoagulation prior to admission.   Goal of Therapy:  Heparin level 0.3-0.7 units/ml Monitor platelets by anticoagulation protocol: Yes   Plan:  Continue with heparin bolus of 4000 units as ordered.  Start heparin drip at 1000 units/hr.  Heparin level in 6 hours.  Daily heparin level and CBC.   Sloan Leiter, PharmD, BCPS Clinical Pharmacist [phone] 04/07/2016,1:11 PM

## 2016-04-07 NOTE — ED Notes (Signed)
Pt drinking orange juice now and eating peanut butter crackers. States she's starting to feel better.

## 2016-04-07 NOTE — ED Notes (Addendum)
CBG measured due to pt stating she feels like her sugar is low. MD made aware of result.

## 2016-04-07 NOTE — ED Triage Notes (Signed)
Pt reports developing L chest pain radiating to back around 0430 this morning. Pt reports taking 2 baby ASA and 4 Nitro (last around 0700) today with no relief. Denies sob, dizziness. Reports only associated symptom is nausea. Reports 2 heart attacks within last year.

## 2016-04-07 NOTE — H&P (Addendum)
CARDIOLOGY ADMISSION NOTE  Patient ID: April Pena MRN: [id number] DOB/AGE: [birth year] 60 y.o.  Admit date: 04/07/2016 Primary Physician   Guadlupe Spanish, MD Primary Cardiologist   None Chief Complaint    Chest pain  HPI:  The patient presents with chest pain.  She came to [facility] ED.   She has a history of renal failure with transplant.    She presents today with chest pain that started at about 4:30 this morning.  She reports that it was like her previous heart attacks.  She has been treated at [facility].  However, I cannot find any records.  She reports that she has had two caths before.  Some years ago she said she had one and she accurately describes a femoral cath.   She reports that she had vessels too small to treat with intervention.  She reports a cath 8 months ago that was radial.  She said that this was so painful in her chest that she is not sure that they were able to complete this.  She doesn't recall what they said but she says that she has never had a stent.  She is very limited in her activities.  She walks with a walker secondary to joint pain and leg weakness.  She has chronic chest pain and takes NTG at home and pain usually goes away.  However, the pain has been happening with less activity.  Today it did not go away after 4 NTG.  [facility] came to [facility] ED and had T wave inversion in the high lateral leads and ST elevation in the anterior leads that looked like repolarization.  There were no old EKGs to compare.  She was made pain free with morphine and now has very mild residual discomfort.  She reports that the pain was similar to her previous angina.  It was 8/10.  There was radiation around the left side to the back.  Of note her troponin was elevated.     Past Medical History:  Diagnosis Date  . Blind left eye   . Blood transfusion without reported diagnosis   . CHF (congestive heart failure) ([place])   . Diabetes mellitus   . Glaucoma   .  Hypertension   . MI (myocardial infarction)   . Renal disorder    kidney transplant  . Unstable angina [facility])     Past Surgical History:  Procedure Laterality Date  . ABDOMINAL HYSTERECTOMY    . CHOLECYSTECTOMY    . EYE SURGERY     cataract  . KIDNEY TRANSPLANT    . NEPHRECTOMY TRANSPLANTED ORGAN  10/2010  . TONSILLECTOMY      Allergies  Allergen Reactions  . Iron Hives and Other (See Comments)    Elevated blood sugar, glaucoma worsened,   . Wellbutrin [Bupropion Hcl] Nausea And Vomiting   No current facility-administered medications on file prior to encounter.    Current Outpatient Prescriptions on File Prior to Encounter  Medication Sig Dispense Refill  . acetaminophen (TYLENOL) 500 MG tablet Take 500 mg by mouth every 6 (six) hours as needed. As needed for pain     . aspirin 81 MG chewable tablet Chew 81 mg by mouth daily.     Marland Kitchen gabapentin (NEURONTIN) 300 MG capsule Take 1 capsule (300 mg total) by mouth 3 (three) times daily. 180 capsule 1  . insulin aspart (NOVOLOG) 100 UNIT/ML injection Inject into the skin 3 (three) times daily before meals. New to pt  unsure about scale    . insulin glargine (LANTUS) 100 UNIT/ML injection Inject into the skin at bedtime.     . isosorbide mononitrate (IMDUR) 60 MG 24 hr tablet Take 60 mg by mouth daily.      Marland Kitchen LEVEMIR FLEXPEN 100 UNIT/ML injection     . magnesium oxide (MAG-OX) 400 MG tablet Take 400 mg by mouth 2 (two) times daily.      . mycophenolate (MYFORTIC) 180 MG EC tablet Take 180 mg by mouth 4 (four) times daily.     Marland Kitchen NITROSTAT 0.4 MG SL tablet     . oxyCODONE-acetaminophen (PERCOCET) 7.5-325 MG per tablet     . predniSONE (DELTASONE) 5 MG tablet Take 5 mg by mouth daily.      . sodium bicarbonate 325 MG tablet Take 325 mg by mouth 2 (two) times daily.     . timolol (BETIMOL) 0.5 % ophthalmic solution Place 2 drops into the right eye 2 (two) times daily.      Marland Kitchen amLODipine (NORVASC) 5 MG tablet     . atorvastatin (LIPITOR) 40 MG  tablet     . DIOVAN 320 MG tablet     . isosorbide mononitrate (ISMO,MONOKET) 20 MG tablet Take 20 mg by mouth 3 (three) times daily.     Marland Kitchen labetalol (NORMODYNE) 300 MG tablet Take 300 mg by mouth 3 (three) times daily. Morning (930  Noon and at 5:30    . pantoprazole (PROTONIX) 20 MG tablet Take 20 mg by mouth daily.     . QUEtiapine (SEROQUEL) 25 MG tablet Take 25 mg by mouth at bedtime.      . sucralfate (CARAFATE) 1 G tablet Take 1 g by mouth 2 (two) times daily.     Marland Kitchen sulfamethoxazole-trimethoprim (BACTRIM,SEPTRA) 400-80 MG per tablet Take 1 tablet by mouth 3 (three) times a week. Takes on Mon Wed and Friday    . tacrolimus (PROGRAF) 1 MG capsule Take 6 mg by mouth daily with breakfast.     . tacrolimus (PROGRAF) 1 MG capsule Take 5 mg by mouth at bedtime.      Marland Kitchen tiZANidine (ZANAFLEX) 4 MG tablet     . traZODone (DESYREL) 100 MG tablet Take 100 mg by mouth at bedtime.       Social History   Social History  . Marital status: Divorced    Spouse name: N/A  . Number of children: N/A  . Years of education: N/A   Occupational History  . Not on file.   Social History Main Topics  . Smoking status: Former Research scientist (life sciences)  . Smokeless tobacco: Never Used  . Alcohol use No  . Drug use: No  . Sexual activity: Not on file   Other Topics Concern  . Not on file   Social History Narrative  . No narrative on file    Family History  Problem Relation Age of Onset  . Diabetes Mother   . Hypertension Mother   . Heart attack Mother   . Diabetes Father   . Hypertension Sister   . Diabetes Brother   . Hypertension Brother   . Diabetes Paternal Grandmother   . Diabetes Daughter   . Hypertension Daughter   . Hyperlipidemia Neg Hx   . Sudden death Neg Hx      ROS:  As stated in the HPI and negative for all other systems.  Physical Exam: Blood pressure 147/73, pulse 70, temperature 98.5 F (36.9 C), temperature source Oral, resp. rate 18, height  5\' 4"  (1.626 m), weight 195 lb (88.5 kg),  SpO2 97 %.  GENERAL:  Well appearing HEENT:  Left lens opacification NECK:  No jugular venous distention, waveform within normal limits, carotid upstroke brisk and symmetric, no bruits, no thyromegaly LYMPHATICS:  No cervical, inguinal adenopathy LUNGS:  Clear to auscultation bilaterally BACK:  No CVA tenderness CHEST:  Unremarkable HEART:  PMI not displaced or sustained,S1 and S2 within normal limits, no S3, no S4, no clicks, no rubs, very brief apical systolic murmurs ABD:  Flat, positive bowel sounds normal in frequency in pitch, no bruits, no rebound, no guarding, no midline pulsatile mass, no hepatomegaly, no splenomegaly EXT:  2 plus pulses throughout, no edema, no cyanosis no clubbing SKIN:  No rashes no nodules NEURO:  Cranial nerves II through XII grossly intact, motor grossly intact throughout PSYCH:  Cognitively intact, oriented to person place and time  Labs: Lab Results  Component Value Date   BUN 36 (H) 04/07/2016   Lab Results  Component Value Date   CREATININE 1.85 (H) 04/07/2016   Lab Results  Component Value Date   NA 137 04/07/2016   K 3.7 04/07/2016   CL 108 04/07/2016   CO2 21 (L) 04/07/2016   Lab Results  Component Value Date   TROPONINI 0.73 (HH) 04/07/2016   Lab Results  Component Value Date   WBC 4.8 04/07/2016   HGB 10.7 (L) 04/07/2016   HCT 33.9 (L) 04/07/2016   MCV 88.7 04/07/2016   PLT 222 04/07/2016   No results found for: CHOL, HDL, LDLCALC, LDLDIRECT, TRIG, CHOLHDL No results found for: ALT, AST, GGT, ALKPHOS, BILITOT   Radiology:  CXR:  Lungs are clear.  No pleural effusion or pneumothorax. Mild cardiomegaly. Mild degenerative changes of the visualized thoracolumbar spine. Cholecystectomy clips.  EKG:  NSR, rate 66, axis WNL, intervals WNL, T wave inversion I and aVL, anterior Q waves.  04/07/2016  ASSESSMENT AND PLAN:    NQWMI:  She gives a history of cath with medical disease.  She had apparent difficulty with her last cath.   She has a renal transplant.  For all of these reasons she will not have urgent cardiac cath.  Continue heparin.  Continue ASA.  Check an echo and continue to cycle enzymes.  I have requested records from [facility].  RENAL TRANSPLANT:  Continue previous meds.  CKD Stage III  DM:  Continue out patient meds and follow with SSI as needed.  HTN:  Continue previous meds.   SignedMinus Breeding 04/07/2016, 3:33 PM

## 2016-04-07 NOTE — ED Notes (Signed)
MD at bedside. 

## 2016-04-07 NOTE — Progress Notes (Signed)
CRITICAL VALUE ALERT  Critical value received: troponin 3.7  Date of notification:  04/07/2016  Time of notification:  3559  Critical value read back: Yes  Nurse who received alert: Stefanie Libel, RN  MD notified (1st page):  Dr. Carlota Raspberry (Cardioloist on call)  Time of first page: 1833  Responding MD:  Dr. Haroldine Laws  Time MD responded:  [phone]

## 2016-04-07 NOTE — ED Provider Notes (Signed)
[facility] DEPT [facility] Provider Note   CSN: [id number] Arrival date & time: 04/07/16  1041     History   Chief Complaint Chief Complaint  Patient presents with  . Chest Pain    HPI April Pena is a 60 y.o. female with past medical history significant for kidney transplant on immunosuppression, hypertension, CHF, diabetes, and report of CAD with prior MI who presents with chest pain. Patient reports that she began having some chest pain last night that was mild to moderate interstitial chest. She says she went to sleep and then woke up this morning with severe pain. She describes the pain as 9 out of 10 severity, located in her central left chest, and radiating towards her left shoulder. She reports associated shortness of breath with exertion, some nausea, with no vomiting. She denies any fevers, chills, or cough. She denies any rhinorrhea Or congestion. She denies any recent chest trauma and denies any leg pain or leg swelling. She denies any history of DVT or PE. She says that this pain feels "the same as my heart attack" and she took aspirin and nitroglycerin before coming to the ED. She denies any syncope, lightheadedness, or palpitations. She denies any other symptoms.       The history is provided by the patient, medical records and a relative. No language interpreter was used.  Chest Pain   This is a recurrent problem. The current episode started yesterday. The problem occurs constantly. The problem has been gradually worsening. The pain is associated with exertion. The pain is present in the substernal region. The pain is at a severity of 8/10. The pain is severe. The quality of the pain is described as pressure-like and dull. The pain radiates to the left shoulder. Associated symptoms include exertional chest pressure, nausea and shortness of breath. Pertinent negatives include no abdominal pain, no back pain, no cough, no diaphoresis, no dizziness, no fever, no headaches, no  lower extremity edema, no near-syncope, no numbness, no palpitations, no vomiting and no weakness. She has tried nitroglycerin for the symptoms. The treatment provided no relief.  Her past medical history is significant for CAD.  Pertinent negatives for past medical history include no seizures.  Procedure history is positive for cardiac catheterization.    Past Medical History:  Diagnosis Date  . Blind left eye   . Blood transfusion without reported diagnosis   . CHF (congestive heart failure) ([place])   . Diabetes mellitus   . Glaucoma   . Hypertension   . MI (myocardial infarction)   . Renal disorder    kidney transplant  . Unstable angina [facility])     Patient Active Problem List   Diagnosis Date Noted  . Low back pain radiating to right leg 04/04/2011    Past Surgical History:  Procedure Laterality Date  . ABDOMINAL HYSTERECTOMY    . CHOLECYSTECTOMY    . EYE SURGERY     cataract  . KIDNEY TRANSPLANT    . NEPHRECTOMY TRANSPLANTED ORGAN  10/2010  . TONSILLECTOMY      OB History    No data available       Home Medications    Prior to Admission medications   Medication Sig Start Date End Date Taking? Authorizing Provider  acetaminophen (TYLENOL) 500 MG tablet Take 500 mg by mouth every 6 (six) hours as needed. As needed for pain     Historical Provider, MD  amLODipine (NORVASC) 5 MG tablet  03/29/11   Historical Provider, MD  aspirin 81 MG chewable tablet Chew 81 mg by mouth daily.     Historical Provider, MD  atorvastatin (LIPITOR) 40 MG tablet  04/01/11   Historical Provider, MD  DIOVAN 320 MG tablet  03/29/11   Historical Provider, MD  gabapentin (NEURONTIN) 300 MG capsule Take 1 capsule (300 mg total) by mouth 3 (three) times daily. 04/02/11 04/01/12  Dene Gentry, MD  insulin aspart (NOVOLOG) 100 UNIT/ML injection Inject into the skin 3 (three) times daily before meals. New to pt unsure about scale    Historical Provider, MD  insulin glargine (LANTUS) 100 UNIT/ML  injection Inject into the skin at bedtime.     Historical Provider, MD  isosorbide mononitrate (IMDUR) 60 MG 24 hr tablet Take 60 mg by mouth daily.      Historical Provider, MD  isosorbide mononitrate (ISMO,MONOKET) 20 MG tablet Take 20 mg by mouth 3 (three) times daily.     Historical Provider, MD  labetalol (NORMODYNE) 300 MG tablet Take 300 mg by mouth 3 (three) times daily. Morning (930  Noon and at 5:30    Historical Provider, MD  LEVEMIR FLEXPEN 100 UNIT/ML injection  03/29/11   Historical Provider, MD  magnesium oxide (MAG-OX) 400 MG tablet Take 400 mg by mouth 2 (two) times daily.      Historical Provider, MD  mycophenolate (MYFORTIC) 180 MG EC tablet Take 180 mg by mouth 4 (four) times daily.     Historical Provider, MD  NITROSTAT 0.4 MG SL tablet  12/27/10   Historical Provider, MD  oxyCODONE-acetaminophen (PERCOCET) 7.5-325 MG per tablet  03/17/11   Historical Provider, MD  pantoprazole (PROTONIX) 20 MG tablet Take 20 mg by mouth daily.     Historical Provider, MD  predniSONE (DELTASONE) 5 MG tablet Take 5 mg by mouth daily.      Historical Provider, MD  QUEtiapine (SEROQUEL) 25 MG tablet Take 25 mg by mouth at bedtime.      Historical Provider, MD  sodium bicarbonate 325 MG tablet Take 325 mg by mouth 2 (two) times daily.     Historical Provider, MD  sucralfate (CARAFATE) 1 G tablet Take 1 g by mouth 2 (two) times daily.     Historical Provider, MD  sulfamethoxazole-trimethoprim (BACTRIM,SEPTRA) 400-80 MG per tablet Take 1 tablet by mouth 3 (three) times a week. Takes on Mon Wed and Friday    Historical Provider, MD  tacrolimus (PROGRAF) 1 MG capsule Take 6 mg by mouth daily with breakfast.     Historical Provider, MD  tacrolimus (PROGRAF) 1 MG capsule Take 5 mg by mouth at bedtime.      Historical Provider, MD  timolol (BETIMOL) 0.5 % ophthalmic solution Place 2 drops into the right eye 2 (two) times daily.      Historical Provider, MD  tiZANidine (ZANAFLEX) 4 MG tablet  03/29/11    Historical Provider, MD  traZODone (DESYREL) 100 MG tablet Take 100 mg by mouth at bedtime.      Historical Provider, MD    Family History Family History  Problem Relation Age of Onset  . Diabetes Mother   . Hypertension Mother   . Heart attack Mother   . Diabetes Father   . Hypertension Sister   . Diabetes Brother   . Hypertension Brother   . Diabetes Paternal Grandmother   . Diabetes Daughter   . Hypertension Daughter   . Hyperlipidemia Neg Hx   . Sudden death Neg Hx     Social History Social History  Substance Use Topics  . Smoking status: Former Research scientist (life sciences)  . Smokeless tobacco: Never Used  . Alcohol use No     Allergies   Iron and Wellbutrin [bupropion hcl]   Review of Systems Review of Systems  Constitutional: Negative for activity change, chills, diaphoresis, fatigue and fever.  HENT: Negative for congestion and rhinorrhea.   Eyes: Negative for visual disturbance.  Respiratory: Positive for shortness of breath. Negative for cough, chest tightness and stridor.   Cardiovascular: Positive for chest pain. Negative for palpitations, leg swelling and near-syncope.  Gastrointestinal: Positive for nausea. Negative for abdominal distention, abdominal pain, constipation, diarrhea and vomiting.  Genitourinary: Negative for difficulty urinating, dysuria, flank pain, frequency, hematuria, menstrual problem, pelvic pain, vaginal bleeding and vaginal discharge.  Musculoskeletal: Negative for back pain and neck pain.  Skin: Negative for rash and wound.  Neurological: Negative for dizziness, seizures, weakness, light-headedness, numbness and headaches.  Psychiatric/Behavioral: Negative for agitation and confusion.  All other systems reviewed and are negative.    Physical Exam Updated Vital Signs BP (!) 102/40 (BP Location: Left Arm)   Pulse 72   Temp 98.5 F (36.9 C) (Oral)   Resp 20   Ht 5\' 4"  (1.626 m)   Wt 195 lb (88.5 kg)   SpO2 100%   BMI 33.47 kg/m   Physical  Exam  Constitutional: She is oriented to person, place, and time. She appears well-developed and well-nourished. No distress.  HENT:  Head: Normocephalic and atraumatic.  Mouth/Throat: Oropharynx is clear and moist. No oropharyngeal exudate.  Eyes: Conjunctivae and EOM are normal. Pupils are equal, round, and reactive to light.  Neck: Normal range of motion. Neck supple.  Cardiovascular: Normal rate, regular rhythm and intact distal pulses.   No murmur heard. Pulmonary/Chest: Effort normal and breath sounds normal. No stridor. No respiratory distress. She has no wheezes. She exhibits tenderness.  Abdominal: Soft. There is no tenderness.  Musculoskeletal: She exhibits no edema.  Neurological: She is alert and oriented to person, place, and time. She has normal reflexes. She displays normal reflexes. No cranial nerve deficit. She exhibits normal muscle tone. Coordination normal.  Skin: Skin is warm and dry.  Psychiatric: She has a normal mood and affect.  Nursing note and vitals reviewed.    ED Treatments / Results  Labs (all labs ordered are listed, but only abnormal results are displayed) Labs Reviewed  CBC WITH DIFFERENTIAL/PLATELET - Abnormal; Notable for the following:       Result Value   RBC 3.82 (*)    Hemoglobin 10.7 (*)    HCT 33.9 (*)    All other components within normal limits  BASIC METABOLIC PANEL - Abnormal; Notable for the following:    CO2 21 (*)    Glucose, Bld 248 (*)    BUN 36 (*)    Creatinine, Ser 1.85 (*)    GFR calc non Af Amer 29 (*)    GFR calc Af Amer 33 (*)    All other components within normal limits  TROPONIN I - Abnormal; Notable for the following:    Troponin I 0.73 (*)    All other components within normal limits  GLUCOSE, CAPILLARY - Abnormal; Notable for the following:    Glucose-Capillary 164 (*)    All other components within normal limits  TROPONIN I - Abnormal; Notable for the following:    Troponin I 3.72 (*)    All other components  within normal limits  TROPONIN I - Abnormal; Notable for the following:  Troponin I 3.32 (*)    All other components within normal limits  TROPONIN I - Abnormal; Notable for the following:    Troponin I 3.71 (*)    All other components within normal limits  CBC - Abnormal; Notable for the following:    WBC 3.5 (*)    Hemoglobin 11.0 (*)    HCT 35.3 (*)    All other components within normal limits  BASIC METABOLIC PANEL - Abnormal; Notable for the following:    CO2 21 (*)    Glucose, Bld 280 (*)    BUN 29 (*)    Creatinine, Ser 1.85 (*)    GFR calc non Af Amer 29 (*)    GFR calc Af Amer 33 (*)    All other components within normal limits  GLUCOSE, CAPILLARY - Abnormal; Notable for the following:    Glucose-Capillary 367 (*)    All other components within normal limits  GLUCOSE, CAPILLARY - Abnormal; Notable for the following:    Glucose-Capillary 183 (*)    All other components within normal limits  CBG MONITORING, ED - Abnormal; Notable for the following:    Glucose-Capillary 56 (*)    All other components within normal limits  MRSA PCR SCREENING  PROTIME-INR  HEPARIN LEVEL (UNFRACTIONATED)  HEPARIN LEVEL (UNFRACTIONATED)  LIPID PANEL    EKG  EKG Interpretation  Date/Time:  Sunday April 07 2016 10:52:54 EDT Ventricular Rate:  71 PR Interval:    QRS Duration: 107 QT Interval:  417 QTC Calculation: 454 R Axis:   -24 Text Interpretation:  Sinus rhythm Probable left atrial enlargement LVH with secondary repolarization abnormality Anterior Q waves, possibly due to LVH Abnormal ECG No comparison s available.  T wave inversions and ST changes present.  NO STEMI Confirmed by Sherry Ruffing MD, [place] [phone]) on 04/07/2016 11:30:05 AM       Radiology Dg Chest 2 View  Result Date: 04/07/2016 CLINICAL DATA:  Left chest pain EXAM: CHEST  2 VIEW COMPARISON:  12/27/2015 FINDINGS: Lungs are clear.  No pleural effusion or pneumothorax. Mild cardiomegaly. Mild degenerative  changes of the visualized thoracolumbar spine. Cholecystectomy clips. IMPRESSION: No evidence of acute cardiopulmonary disease. Electronically Signed   By: Julian Hy M.D.   On: 04/07/2016 11:39    Procedures Procedures (including critical care time)  CRITICAL CARE Performed by: Gwenyth Allegra Albana Saperstein Total critical care time: 35 minutes Critical care time was exclusive of separately billable procedures and treating other patients. Critical care was necessary to treat or prevent imminent or life-threatening deterioration. Critical care was time spent personally by me on the following activities: development of treatment plan with patient and/or surrogate as well as nursing, discussions with consultants, evaluation of patient's response to treatment, examination of patient, obtaining history from patient or surrogate, ordering and performing treatments and interventions, ordering and review of laboratory studies, ordering and review of radiographic studies, pulse oximetry and re-evaluation of patient's condition.  Medications Ordered in ED Medications  heparin ADULT infusion 100 units/mL (25000 units/228mL sodium chloride 0.45%) (1,000 Units/hr Intravenous Rate/Dose Verify 04/07/16 1800)  nitroGLYCERIN (NITROSTAT) SL tablet 0.4 mg (not administered)  ondansetron (ZOFRAN) injection 4 mg (not administered)  amLODipine (NORVASC) tablet 10 mg (not administered)  atorvastatin (LIPITOR) tablet 20 mg (20 mg Oral Given 04/07/16 2229)  clopidogrel (PLAVIX) tablet 75 mg (not administered)  aspirin EC tablet 81 mg (not administered)  dorzolamide-timolol (COSOPT) 22.3-6.8 MG/ML ophthalmic solution 1 drop (1 drop Both Eyes Given 04/07/16 2234)  pantoprazole (PROTONIX) EC  tablet 40 mg (not administered)  prednisoLONE acetate (PRED FORTE) 1 % ophthalmic suspension 1 drop (not administered)  sodium bicarbonate tablet 650 mg (650 mg Oral Given 04/07/16 2233)  metoprolol succinate (TOPROL-XL) 24 hr  tablet 100 mg (not administered)  insulin detemir (LEVEMIR) injection 10 Units (10 Units Subcutaneous Given 04/07/16 2230)  insulin aspart (novoLOG) injection 10 Units (not administered)  gabapentin (NEURONTIN) capsule 300 mg (300 mg Oral Given 04/07/16 2228)  acetaminophen (TYLENOL) tablet 500-1,000 mg (not administered)  isosorbide mononitrate (IMDUR) 24 hr tablet 60 mg (not administered)  magnesium oxide (MAG-OX) tablet 400 mg (400 mg Oral Given 04/07/16 2229)  mycophenolate (MYFORTIC) EC tablet 360 mg (360 mg Oral Given 04/07/16 2232)  predniSONE (DELTASONE) tablet 5 mg (not administered)  sulfamethoxazole-trimethoprim (BACTRIM,SEPTRA) 400-80 MG per tablet 1 tablet (not administered)  tacrolimus (PROGRAF) capsule 3 mg (not administered)  tacrolimus (PROGRAF) capsule 2 mg (2 mg Oral Given 04/07/16 2232)  oxyCODONE-acetaminophen (PERCOCET) 7.5-325 MG per tablet 1 tablet (not administered)  morphine 2 MG/ML injection 2 mg (2 mg Intravenous Given 04/07/16 1312)  heparin bolus via infusion 4,000 Units (4,000 Units Intravenous Bolus from Bag 04/07/16 1323)  dextrose 50 % solution 50 mL (50 mLs Intravenous Given 04/07/16 1403)  aspirin chewable tablet 324 mg (324 mg Oral Given 04/07/16 1712)    Or  aspirin suppository 300 mg ( Rectal See Alternative 04/07/16 1712)     Initial Impression / Assessment and Plan / ED Course  I have reviewed the triage vital signs and the nursing notes.  Pertinent labs & imaging results that were available during my care of the patient were reviewed by me and considered in my medical decision making (see chart for details).  Clinical Course    Jennavie Martinek is a 60 y.o. female with past medical history significant for kidney transplant on immunosuppression, hypertension, CHF, diabetes, and report of CAD with prior MI who presents with chest pain.   History and exam are seen above.  Shortly after arrival, EKG was obtained. EKG showed some ST changes and  T-wave inversions. Despite patient's reported cardiac history, no prior EKGs were in our system to view. Chart review showed reports of prior EKGs that revealed T-wave changes. Because of this, cardiology was quickly called to discuss EKG findings.   While awaiting cardiology, exam was performed and patient had clear lungs, likely tender chest, nontender abdomen, and symmetric pulses. Patient had appearance of chronic cataract versus blindness in left eye but neurologic exam otherwise intact.  11:19 AM Cardiology quickly called and they recommended no activation of STEMI. They requested patient be worked up laboratory testing, imaging, and patient will likely need admission for further management of high-risk chest pain.  Patient will have laboratory testing to evaluate for cardiac etiology of pain.  12:10 PM Troponin positive at 0.73. EKG appeared unchanged from before.   Cardiology will be re-called and patient will need admission for further management. PT started on Heparin. PT given morphine with improvement in pain.   Pt transferred in stable condition.   Final Clinical Impressions(s) / ED Diagnoses   Final diagnoses:  Chest pain, unspecified type  Troponin level elevated    Clinical Impression: 1. Chest pain, unspecified type   2. Troponin level elevated     Disposition: Admit to Cardiology    Courtney Paris, MD 04/08/16 [phone]

## 2016-04-08 ENCOUNTER — Encounter (HOSPITAL_COMMUNITY)
Admission: EM | Disposition: A | Discharge status: Home / Self Care | Payer: Self-pay | Source: Home / Self Care | Attending: Surgery

## 2016-04-08 ENCOUNTER — Other Ambulatory Visit: Payer: Self-pay | Admitting: *Deleted

## 2016-04-08 ENCOUNTER — Encounter (HOSPITAL_COMMUNITY): Payer: Self-pay | Admitting: Cardiovascular Disease

## 2016-04-08 DIAGNOSIS — E785 Hyperlipidemia, unspecified: Secondary | ICD-10-CM | POA: Diagnosis present

## 2016-04-08 DIAGNOSIS — N184 Chronic kidney disease, stage 4 (severe): Secondary | ICD-10-CM | POA: Diagnosis not present

## 2016-04-08 DIAGNOSIS — I2510 Atherosclerotic heart disease of native coronary artery without angina pectoris: Secondary | ICD-10-CM | POA: Diagnosis not present

## 2016-04-08 DIAGNOSIS — R079 Chest pain, unspecified: Secondary | ICD-10-CM | POA: Diagnosis present

## 2016-04-08 DIAGNOSIS — E1122 Type 2 diabetes mellitus with diabetic chronic kidney disease: Secondary | ICD-10-CM | POA: Diagnosis present

## 2016-04-08 DIAGNOSIS — K5900 Constipation, unspecified: Secondary | ICD-10-CM | POA: Diagnosis not present

## 2016-04-08 DIAGNOSIS — N183 Chronic kidney disease, stage 3 (moderate): Secondary | ICD-10-CM | POA: Diagnosis present

## 2016-04-08 DIAGNOSIS — E11649 Type 2 diabetes mellitus with hypoglycemia without coma: Secondary | ICD-10-CM | POA: Diagnosis not present

## 2016-04-08 DIAGNOSIS — Y848 Other medical procedures as the cause of abnormal reaction of the patient, or of later complication, without mention of misadventure at the time of the procedure: Secondary | ICD-10-CM | POA: Diagnosis not present

## 2016-04-08 DIAGNOSIS — Z01810 Encounter for preprocedural cardiovascular examination: Secondary | ICD-10-CM | POA: Diagnosis not present

## 2016-04-08 DIAGNOSIS — E1169 Type 2 diabetes mellitus with other specified complication: Secondary | ICD-10-CM | POA: Diagnosis not present

## 2016-04-08 DIAGNOSIS — Z6835 Body mass index (BMI) 35.0-35.9, adult: Secondary | ICD-10-CM | POA: Diagnosis not present

## 2016-04-08 DIAGNOSIS — I25110 Atherosclerotic heart disease of native coronary artery with unstable angina pectoris: Secondary | ICD-10-CM

## 2016-04-08 DIAGNOSIS — Z7952 Long term (current) use of systemic steroids: Secondary | ICD-10-CM | POA: Diagnosis not present

## 2016-04-08 DIAGNOSIS — R748 Abnormal levels of other serum enzymes: Secondary | ICD-10-CM

## 2016-04-08 DIAGNOSIS — Z7982 Long term (current) use of aspirin: Secondary | ICD-10-CM | POA: Diagnosis not present

## 2016-04-08 DIAGNOSIS — Z794 Long term (current) use of insulin: Secondary | ICD-10-CM | POA: Diagnosis not present

## 2016-04-08 DIAGNOSIS — I214 Non-ST elevation (NSTEMI) myocardial infarction: Principal | ICD-10-CM

## 2016-04-08 DIAGNOSIS — E860 Dehydration: Secondary | ICD-10-CM | POA: Diagnosis not present

## 2016-04-08 DIAGNOSIS — I252 Old myocardial infarction: Secondary | ICD-10-CM | POA: Diagnosis not present

## 2016-04-08 DIAGNOSIS — I119 Hypertensive heart disease without heart failure: Secondary | ICD-10-CM

## 2016-04-08 DIAGNOSIS — Z87891 Personal history of nicotine dependence: Secondary | ICD-10-CM | POA: Diagnosis not present

## 2016-04-08 DIAGNOSIS — E1143 Type 2 diabetes mellitus with diabetic autonomic (poly)neuropathy: Secondary | ICD-10-CM | POA: Diagnosis present

## 2016-04-08 DIAGNOSIS — I130 Hypertensive heart and chronic kidney disease with heart failure and stage 1 through stage 4 chronic kidney disease, or unspecified chronic kidney disease: Secondary | ICD-10-CM | POA: Diagnosis present

## 2016-04-08 DIAGNOSIS — E669 Obesity, unspecified: Secondary | ICD-10-CM | POA: Diagnosis not present

## 2016-04-08 DIAGNOSIS — Z940 Kidney transplant status: Secondary | ICD-10-CM | POA: Diagnosis not present

## 2016-04-08 DIAGNOSIS — H409 Unspecified glaucoma: Secondary | ICD-10-CM | POA: Diagnosis present

## 2016-04-08 DIAGNOSIS — K3184 Gastroparesis: Secondary | ICD-10-CM | POA: Diagnosis present

## 2016-04-08 DIAGNOSIS — Z79899 Other long term (current) drug therapy: Secondary | ICD-10-CM | POA: Diagnosis not present

## 2016-04-08 DIAGNOSIS — E871 Hypo-osmolality and hyponatremia: Secondary | ICD-10-CM | POA: Diagnosis not present

## 2016-04-08 DIAGNOSIS — E119 Type 2 diabetes mellitus without complications: Secondary | ICD-10-CM | POA: Diagnosis not present

## 2016-04-08 DIAGNOSIS — E7800 Pure hypercholesterolemia, unspecified: Secondary | ICD-10-CM

## 2016-04-08 DIAGNOSIS — I509 Heart failure, unspecified: Secondary | ICD-10-CM | POA: Diagnosis present

## 2016-04-08 DIAGNOSIS — I97630 Postprocedural hematoma of a circulatory system organ or structure following a cardiac catheterization: Secondary | ICD-10-CM | POA: Diagnosis not present

## 2016-04-08 HISTORY — PX: CARDIAC CATHETERIZATION: SHX172

## 2016-04-08 LAB — GLUCOSE, CAPILLARY
GLUCOSE-CAPILLARY: 170 mg/dL — AB (ref 65–99)
GLUCOSE-CAPILLARY: 101 mg/dL — AB (ref 65–99)
Glucose-Capillary: 183 mg/dL — ABNORMAL HIGH (ref 65–99)
Glucose-Capillary: 175 mg/dL — ABNORMAL HIGH (ref 65–99)
Glucose-Capillary: 36 mg/dL — CL (ref 65–99)
Glucose-Capillary: 51 mg/dL — ABNORMAL LOW (ref 65–99)
Glucose-Capillary: 454 mg/dL — ABNORMAL HIGH (ref 65–99)

## 2016-04-08 LAB — CBC
HCT: 35.3 % — ABNORMAL LOW (ref 36.0–46.0)
HEMOGLOBIN: 11 g/dL — AB (ref 12.0–15.0)
MCH: 27.7 pg (ref 26.0–34.0)
MCHC: 31.2 g/dL (ref 30.0–36.0)
MCV: 88.9 fL (ref 78.0–100.0)
Platelets: 223 10*3/uL (ref 150–400)
RBC: 3.97 MIL/uL (ref 3.87–5.11)
RDW: 14.1 % (ref 11.5–15.5)
WBC: 3.5 10*3/uL — AB (ref 4.0–10.5)

## 2016-04-08 LAB — LIPID PANEL
CHOL/HDL RATIO: 2.4 ratio
CHOLESTEROL: 149 mg/dL (ref 0–200)
HDL: 61 mg/dL (ref >40)
LDL Cholesterol: 74 mg/dL (ref 0–99)
Triglycerides: 71 mg/dL (ref <150)
VLDL: 14 mg/dL (ref 0–40)

## 2016-04-08 LAB — POCT ACTIVATED CLOTTING TIME: ACTIVATED CLOTTING TIME: 142 s

## 2016-04-08 LAB — BASIC METABOLIC PANEL
Anion gap: 8 (ref 5–15)
BUN: 29 mg/dL — AB (ref 6–20)
CO2: 21 mmol/L — ABNORMAL LOW (ref 22–32)
CREATININE: 1.85 mg/dL — AB (ref 0.44–1.00)
Calcium: 9.2 mg/dL (ref 8.9–10.3)
Chloride: 107 mmol/L (ref 101–111)
GFR calc Af Amer: 33 mL/min — ABNORMAL LOW (ref >60)
GFR, EST NON AFRICAN AMERICAN: 29 mL/min — AB (ref >60)
Glucose, Bld: 280 mg/dL — ABNORMAL HIGH (ref 65–99)
POTASSIUM: 4.2 mmol/L (ref 3.5–5.1)
SODIUM: 136 mmol/L (ref 135–145)

## 2016-04-08 LAB — PROTIME-INR
INR: 0.98
Prothrombin Time: 13 seconds (ref 11.4–15.2)

## 2016-04-08 LAB — HEPARIN LEVEL (UNFRACTIONATED): HEPARIN UNFRACTIONATED: 0.5 [IU]/mL (ref 0.30–0.70)

## 2016-04-08 LAB — TROPONIN I: TROPONIN I: 3.71 ng/mL — AB (ref <0.03)

## 2016-04-08 SURGERY — LEFT HEART CATH AND CORONARY ANGIOGRAPHY
Anesthesia: LOCAL

## 2016-04-08 MED ORDER — SODIUM CHLORIDE 0.9% FLUSH: 3.0000 mL | INTRAVENOUS | Status: DC | PRN

## 2016-04-08 MED ORDER — LIDOCAINE HCL (PF) 1 % IJ SOLN
INTRAMUSCULAR | Status: AC
  Filled 2016-04-08: qty 30

## 2016-04-08 MED ORDER — IOPAMIDOL (ISOVUE-370) INJECTION 76%
INTRAVENOUS | Status: AC
  Filled 2016-04-08: qty 100

## 2016-04-08 MED ORDER — SODIUM CHLORIDE 0.9% FLUSH
3.0000 mL | Freq: Two times a day (BID) | INTRAVENOUS | Status: DC
  Administered 2016-04-09 – 2016-04-12 (×7): 3 mL via INTRAVENOUS

## 2016-04-08 MED ORDER — ATORVASTATIN CALCIUM 40 MG PO TABS
40.0000 mg | ORAL_TABLET | Freq: Every day | ORAL | Status: DC
  Administered 2016-04-08 – 2016-04-16 (×8): 40 mg via ORAL
  Filled 2016-04-08 (×10): qty 1

## 2016-04-08 MED ORDER — HEPARIN (PORCINE) IN NACL 2-0.9 UNIT/ML-% IJ SOLN
INTRAMUSCULAR | Status: DC | PRN
  Administered 2016-04-08: 1000 mL

## 2016-04-08 MED ORDER — MIDAZOLAM HCL 2 MG/2ML IJ SOLN
INTRAMUSCULAR | Status: AC
  Filled 2016-04-08: qty 2

## 2016-04-08 MED ORDER — MIDAZOLAM HCL 2 MG/2ML IJ SOLN
INTRAMUSCULAR | Status: DC | PRN
  Administered 2016-04-08: 2 mg via INTRAVENOUS

## 2016-04-08 MED ORDER — SODIUM CHLORIDE 0.9 % IV SOLN: 250.0000 mL | INTRAVENOUS | Status: DC | PRN

## 2016-04-08 MED ORDER — SODIUM CHLORIDE 4 MEQ/ML IV SOLN
INTRAVENOUS | Status: DC
  Administered 2016-04-08: 13:00:00 via INTRAVENOUS
  Filled 2016-04-08 (×12): qty 1000

## 2016-04-08 MED ORDER — HEPARIN (PORCINE) IN NACL 2-0.9 UNIT/ML-% IJ SOLN
INTRAMUSCULAR | Status: AC
  Filled 2016-04-08: qty 1000

## 2016-04-08 MED ORDER — LIDOCAINE HCL (PF) 1 % IJ SOLN
INTRAMUSCULAR | Status: DC | PRN
  Administered 2016-04-08: 18 mL

## 2016-04-08 MED ORDER — SODIUM CHLORIDE 0.9 % WEIGHT BASED INFUSION: 3.0000 mL/kg/h | INTRAVENOUS | Status: DC

## 2016-04-08 MED ORDER — HEPARIN (PORCINE) IN NACL 100-0.45 UNIT/ML-% IJ SOLN
1050.0000 [IU]/h | INTRAMUSCULAR | Status: DC
  Administered 2016-04-08: 1000 [IU]/h via INTRAVENOUS
  Administered 2016-04-09: 1200 [IU]/h via INTRAVENOUS
  Administered 2016-04-10 – 2016-04-11 (×2): 1050 [IU]/h via INTRAVENOUS
  Filled 2016-04-08 (×4): qty 250

## 2016-04-08 MED ORDER — DORZOLAMIDE HCL-TIMOLOL MAL 2-0.5 % OP SOLN
1.0000 [drp] | Freq: Two times a day (BID) | OPHTHALMIC | Status: DC
  Administered 2016-04-08 – 2016-04-19 (×22): 1 [drp] via OPHTHALMIC
  Filled 2016-04-08 (×4): qty 10

## 2016-04-08 MED ORDER — DEXTROSE 50 % IV SOLN
INTRAVENOUS | Status: AC
  Filled 2016-04-08: qty 50

## 2016-04-08 MED ORDER — DEXTROSE 50 % IV SOLN
1.0000 | Freq: Once | INTRAVENOUS | Status: AC
Start: 2016-04-08 — End: 2016-04-08
  Administered 2016-04-08: 50 mL via INTRAVENOUS

## 2016-04-08 MED ORDER — SODIUM CHLORIDE 0.9 % IV SOLN
INTRAVENOUS | Status: AC
  Administered 2016-04-08: 15:00:00 via INTRAVENOUS

## 2016-04-08 MED ORDER — SODIUM CHLORIDE 0.9% FLUSH
3.0000 mL | Freq: Two times a day (BID) | INTRAVENOUS | Status: DC
  Administered 2016-04-08: 3 mL via INTRAVENOUS

## 2016-04-08 MED ORDER — ASPIRIN 81 MG PO CHEW
324.0000 mg | CHEWABLE_TABLET | ORAL | Status: AC
  Administered 2016-04-08: 324 mg via ORAL
  Filled 2016-04-08: qty 4

## 2016-04-08 MED ORDER — FENTANYL CITRATE (PF) 100 MCG/2ML IJ SOLN
INTRAMUSCULAR | Status: AC
  Filled 2016-04-08: qty 2

## 2016-04-08 MED ORDER — IOPAMIDOL (ISOVUE-370) INJECTION 76%
INTRAVENOUS | Status: DC | PRN
  Administered 2016-04-08: 50 mL via INTRAVENOUS

## 2016-04-08 MED ORDER — DEXTROSE 50 % IV SOLN
INTRAVENOUS | Status: AC
  Administered 2016-04-08: 25 mL
  Filled 2016-04-08: qty 50

## 2016-04-08 MED ORDER — GABAPENTIN 300 MG PO CAPS
300.0000 mg | ORAL_CAPSULE | Freq: Every day | ORAL | Status: DC
  Administered 2016-04-09 – 2016-04-12 (×4): 300 mg via ORAL
  Filled 2016-04-08 (×4): qty 1

## 2016-04-08 MED ORDER — SODIUM CHLORIDE 0.9 % WEIGHT BASED INFUSION
1.0000 mL/kg/h | INTRAVENOUS | Status: DC
Start: 2016-04-08 — End: 2016-04-08
  Administered 2016-04-08: 1 mL/kg/h via INTRAVENOUS

## 2016-04-08 MED ORDER — FENTANYL CITRATE (PF) 100 MCG/2ML IJ SOLN
INTRAMUSCULAR | Status: DC | PRN
  Administered 2016-04-08: 25 ug via INTRAVENOUS

## 2016-04-08 SURGICAL SUPPLY — 6 items
CATH INFINITI 5FR MULTPACK ANG (CATHETERS) ×1 IMPLANT
GUIDEWIRE 3MM J TIP .035 145 (WIRE) ×1 IMPLANT
KIT HEART LEFT (KITS) ×2 IMPLANT
PACK CARDIAC CATHETERIZATION (CUSTOM PROCEDURE TRAY) ×2 IMPLANT
SHEATH PINNACLE 5F 10CM (SHEATH) ×1 IMPLANT
TRANSDUCER W/STOPCOCK (MISCELLANEOUS) ×2 IMPLANT

## 2016-04-08 NOTE — Progress Notes (Signed)
[facility] for Heparin Indication: chest pain/ACS  Allergies  Allergen Reactions  . Iron Hives and Other (See Comments)    Elevated blood sugar, glaucoma worsened,   . Promethazine Other (See Comments)    Pt states her muscles jerk  . Wellbutrin [Bupropion Hcl] Nausea And Vomiting    Patient Measurements: Height: 5\' 3"  (160 cm) Weight: 200 lb 9.9 oz (91 kg) IBW/kg (Calculated) : 52.4 Heparin Dosing Weight: 74.4 kg  Vital Signs: Temp: 97.6 F (36.4 C) (10/30 1100) Temp Source: Oral (10/30 1100) BP: 140/82 (10/30 1431) Pulse Rate: 60 (10/30 1431)  Labs:  Recent Labs  04/07/16 1105 04/07/16 1713 04/07/16 1904 04/07/16 2159 04/08/16 0345 04/08/16 0348  HGB 10.7*  --   --   --  11.0*  --   HCT 33.9*  --   --   --  35.3*  --   PLT 222  --   --   --  223  --   LABPROT 13.4  --   --   --   --  13.0  INR 1.02  --   --   --   --  0.98  HEPARINUNFRC  --   --  0.55  --  0.50  --   CREATININE 1.85*  --   --   --  1.85*  --   TROPONINI 0.73* 3.72*  --  3.32* 3.71*  --     Estimated Creatinine Clearance: 34.6 mL/min (by C-G formula based on SCr of 1.85 mg/dL (H)).  Infusions:  . sodium chloride 75 mL/hr at 04/08/16 1457  . D-10-0.45% Sodium Chloride with KCL 40 meq/L 1000 ml 100 mL/hr at 04/08/16 1311   Assessment:  60 year old female in ED at [facility] with chest pain to start IV heparin per pharmacy consult. Troponin trending up, MD considering LHC soon.  AM heparin level remains therapeutic.  CBC stable, no bleeding or complications noted.  PM f/u > now s/p cath, needs CABG eval.  Pharmacy asked to resume IV heparin 8 hrs after sheath pull.  Sheath removed ~ 1415 PM.  Goal of Therapy:  Heparin level 0.3-0.7 units/ml Monitor platelets by anticoagulation protocol: Yes   Plan:  Resume IV heparin at 1000 units/hr at 2230 PM. Check heparin level 6 hrs after gtt resumed. Daily heparin level and CBC. F/u plans for  CABG.  Uvaldo Rising, BCPS  Clinical Pharmacist Pager [phone]  04/08/2016 3:01 PM

## 2016-04-08 NOTE — Interval H&P Note (Signed)
History and Physical Interval Note:  04/08/2016 1:42 PM  April Pena  has presented today for cardiac cath with the diagnosis of CADNSTEMI  The various methods of treatment have been discussed with the patient and family. After consideration of risks, benefits and other options for treatment, the patient has consented to  Procedure(s): Left Heart Cath and Coronary Angiography (N/A) as a surgical intervention .  The patient's history has been reviewed, patient examined, no change in status, stable for surgery.  I have reviewed the patient's chart and labs.  Questions were answered to the patient's satisfaction.    Cath Lab Visit (complete for each Cath Lab visit)  Clinical Evaluation Leading to the Procedure:   ACS: Yes.    Non-ACS:    Anginal Classification: CCS III  Anti-ischemic medical therapy: Maximal Therapy (2 or more classes of medications)  Non-Invasive Test Results: No non-invasive testing performed  Prior CABG: No previous CABG         Lauree Chandler

## 2016-04-08 NOTE — H&P (View-Only) (Signed)
Patient Name: April Pena Date of Encounter: 04/08/2016  Primary Cardiologist: (new-Hochrein)  Hospital Problem List     Active Problems:   Troponin level elevated   NSTEMI (non-ST elevated myocardial infarction) [facility])    Subjective   April Pena is feeling well. She continues to have mild chest pain that is not as severe as before coming to the hospital. She denied breath.   Inpatient Medications    Scheduled Meds: . amLODipine  10 mg Oral Daily  . aspirin EC  81 mg Oral Daily  . atorvastatin  20 mg Oral QHS  . clopidogrel  75 mg Oral Daily  . dorzolamide-timolol  1 drop Both Eyes BID  . gabapentin  300 mg Oral BID  . insulin aspart  10 Units Subcutaneous TID WC  . insulin detemir  10 Units Subcutaneous QHS  . isosorbide mononitrate  60 mg Oral Daily  . magnesium oxide  400 mg Oral BID  . metoprolol succinate  100 mg Oral Daily  . mycophenolate  360 mg Oral BID  . pantoprazole  40 mg Oral Daily  . predniSONE  5 mg Oral Daily  . sodium bicarbonate  650 mg Oral BID  . sulfamethoxazole-trimethoprim  1 tablet Oral Q M,W,F  . tacrolimus  2 mg Oral QHS  . tacrolimus  3 mg Oral Daily   Continuous Infusions: . heparin 1,000 Units/hr (04/07/16 1800)   PRN Meds: acetaminophen, nitroGLYCERIN, ondansetron (ZOFRAN) IV, oxyCODONE-acetaminophen, prednisoLONE acetate   Vital Signs    Vitals:   04/08/16 0610 04/08/16 0700 04/08/16 0800 04/08/16 0806  BP: (!) 155/76 (!) 160/81 (!) 173/86 (!) 160/88  Pulse: (!) 53 (!) 55 (!) 58 72  Resp: 12 20 13 18   Temp:  97.6 F (36.4 C)  97.5 F (36.4 C)  TempSrc:  Oral  Oral  SpO2: 100% 100% 100% 99%  Weight:      Height:        Intake/Output Summary (Last 24 hours) at 04/08/16 0947 Last data filed at 04/08/16 0700  Gross per 24 hour  Intake              520 ml  Output              900 ml  Net             -380 ml   Filed Weights   04/07/16 1054 04/07/16 1408  Weight: 88.5 kg (195 lb) 91 kg (200 lb 9.9 oz)    Physical  Exam   GEN: Well nourished, well developed, in no acute distress.  HEENT: Grossly normal.  Neck: Supple, no JVD, carotid bruits, or masses. Cardiac: RRR, no murmurs, rubs, or gallops. No clubbing, cyanosis, edema.  Radials/DP/PT 2+ and equal bilaterally.  Respiratory:  Respirations regular and unlabored, clear to auscultation bilaterally. GI: Soft, nontender, nondistended, BS + x 4. MS: no deformity or atrophy. Skin: warm and dry, no rash. Neuro:  Strength and sensation are intact. Psych: AAOx3.  Normal affect.  Labs    CBC  Recent Labs  04/07/16 1105 04/08/16 0345  WBC 4.8 3.5*  NEUTROABS 3.4  --   HGB 10.7* 11.0*  HCT 33.9* 35.3*  MCV 88.7 88.9  PLT 222 254   Basic Metabolic Panel  Recent Labs  04/07/16 1105 04/08/16 0345  NA 137 136  K 3.7 4.2  CL 108 107  CO2 21* 21*  GLUCOSE 248* 280*  BUN 36* 29*  CREATININE 1.85* 1.85*  CALCIUM 9.2 9.2  Liver Function Tests No results for input(s): AST, ALT, ALKPHOS, BILITOT, PROT, ALBUMIN in the last 72 hours. No results for input(s): LIPASE, AMYLASE in the last 72 hours. Cardiac Enzymes  Recent Labs  04/07/16 1713 04/07/16 2159 04/08/16 0345  TROPONINI 3.72* 3.32* 3.71*   BNP Invalid input(s): POCBNP D-Dimer No results for input(s): DDIMER in the last 72 hours. Hemoglobin A1C No results for input(s): HGBA1C in the last 72 hours. Fasting Lipid Panel  Recent Labs  04/08/16 0345  CHOL 149  HDL 61  LDLCALC 74  TRIG 71  CHOLHDL 2.4   Thyroid Function Tests No results for input(s): TSH, T4TOTAL, T3FREE, THYROIDAB in the last 72 hours.  Invalid input(s): FREET3  Telemetry    No events - Personally Reviewed  ECG    Sinus bradycardia. Rate 51 bpm. Lateral T-wave inversions- Personally Reviewed  Radiology    Dg Chest 2 View  Result Date: 04/07/2016 CLINICAL DATA:  Left chest pain EXAM: CHEST  2 VIEW COMPARISON:  12/27/2015 FINDINGS: Lungs are clear.  No pleural effusion or pneumothorax. Mild  cardiomegaly. Mild degenerative changes of the visualized thoracolumbar spine. Cholecystectomy clips. IMPRESSION: No evidence of acute cardiopulmonary disease. Electronically Signed   By: Julian Hy M.D.   On: 04/07/2016 11:39    Cardiac Studies   n/a  Patient Profile     April Pena is a 34F with Hypertensive heart disease, hyperlipidemia, diabetes, status post renal transplant with residual chronic kidney disease here with NSTEMI.  Assessment & Plan    # NSTEMI: Troponin elevated to 3.71. She continues to have chest discomfort. She reports having difficulty with a radial heart catheterization in the past and prefers to go family. She does have chronic kidney disease after renal transplant. We discussed the risk of worsening renal function with cardiac catheterization. However, given that she continues to have chest pain and has known coronary disease, there are not many other good options besides pursuing heart cath.  Records from [facility] are pending.  Continue aspirin, heparin, Imdur, metoprolol, and atorvastatin.   # Hypertension: Continue amlodipine and metoprolol. She is not on an ACE inhibitor or ARB due to renal dysfunction.   # Hyperlipidemia:  LDL 74 this Increase atorvastatin to 40 mg to achieve LDL less than 70.admission.    Signed, Skeet Latch, MD  04/08/2016, 9:47 AM

## 2016-04-08 NOTE — Progress Notes (Signed)
[place] for Heparin Indication: chest pain/ACS  Allergies  Allergen Reactions  . Iron Hives and Other (See Comments)    Elevated blood sugar, glaucoma worsened,   . Promethazine Other (See Comments)    Pt states her muscles jerk  . Wellbutrin [Bupropion Hcl] Nausea And Vomiting    Patient Measurements: Height: 5\' 3"  (160 cm) Weight: 200 lb 9.9 oz (91 kg) IBW/kg (Calculated) : 52.4 Heparin Dosing Weight: 74.4 kg  Vital Signs: Temp: 97.5 F (36.4 C) (10/30 0806) Temp Source: Oral (10/30 0806) BP: 160/88 (10/30 0806) Pulse Rate: 72 (10/30 0806)  Labs:  Recent Labs  04/07/16 1105 04/07/16 1713 04/07/16 1904 04/07/16 2159 04/08/16 0345 04/08/16 0348  HGB 10.7*  --   --   --  11.0*  --   HCT 33.9*  --   --   --  35.3*  --   PLT 222  --   --   --  223  --   LABPROT 13.4  --   --   --   --  13.0  INR 1.02  --   --   --   --  0.98  HEPARINUNFRC  --   --  0.55  --  0.50  --   CREATININE 1.85*  --   --   --  1.85*  --   TROPONINI 0.73* 3.72*  --  3.32* 3.71*  --     Estimated Creatinine Clearance: 34.6 mL/min (by C-G formula based on SCr of 1.85 mg/dL (H)).  Infusions:  . sodium chloride     Followed by  . sodium chloride 1 mL/kg/hr (04/08/16 1046)  . heparin 1,000 Units/hr (04/07/16 1800)   Assessment:  60 year old female in ED at [facility] with chest pain to start IV heparin per pharmacy consult. Troponin trending up, MD considering LHC soon.  AM heparin level remains therapeutic.  CBC stable, no bleeding or complications noted.  Goal of Therapy:  Heparin level 0.3-0.7 units/ml Monitor platelets by anticoagulation protocol: Yes   Plan:  Continue heparin gtt at 1000 units/hr  Daily heparin level and CBC.  F/u plans for cath lab.  Uvaldo Rising, BCPS  Clinical Pharmacist Pager [phone]  04/08/2016 11:09 AM

## 2016-04-08 NOTE — Progress Notes (Addendum)
Inpatient Diabetes Program Recommendations  AACE/ADA: New Consensus Statement on Inpatient Glycemic Control (2015)  Target Ranges:  Prepandial:   less than 140 mg/dL      Peak postprandial:   less than 180 mg/dL (1-2 hours)      Critically ill patients:  140 - 180 mg/dL   Results for LATORSHA, CURLING (MRN [id number]) as of 04/08/2016 12:44  Ref. Range 04/07/2016 13:56 04/07/2016 14:43 04/07/2016 16:04 04/07/2016 21:46 04/08/2016 07:53 04/08/2016 10:53 04/08/2016 11:04 04/08/2016 12:28  Glucose-Capillary Latest Ref Range: 65 - 99 mg/dL 56 (L) 170 (H) 164 (H) 367 (H) 183 (H) 36 (LL) NPO for Cardiac Cath 175 (H) 51 (L)   Review of Glycemic Control  Diabetes history: MD; renal disease (kidney transplant), obesity, home steroid regimen (last 10/29) Outpatient Diabetes medications: Novolog 10-30 units TIDAC, Levemir 10-20 units QHS Current orders for Inpatient glycemic control: Novolog 10 units TIDAC, Levemir 10 units QHS  Inpatient Diabetes Program Recommendations:  Please consider a decrease in meal coverage to Novolog 3-4 units TIDAC if patient eats >50%.  Spoke with floor RN regarding low's.  Will continue to monitor.  Adding a sensitive correction scale if CBG's start to trend higher may be appropriate in the future instead of increasing on meal coverage.  Per ADA recommendations "consider performing an A1C on all patients with diabetes or hyperglycemia admitted to the hospital if not performed in the prior 3 months".  Thank you,  Windy Carina, RN, BSN Diabetes Coordinator Inpatient Diabetes Program [phone] (Team Pager)

## 2016-04-08 NOTE — Progress Notes (Signed)
Patient Name: April Pena Date of Encounter: 04/08/2016  Primary Cardiologist: (new-Hochrein)  Hospital Problem List     Active Problems:   Troponin level elevated   NSTEMI (non-ST elevated myocardial infarction) [facility])    Subjective   Ms. April Pena is feeling well. She continues to have mild chest pain that is not as severe as before coming to the hospital. She denied breath.   Inpatient Medications    Scheduled Meds: . amLODipine  10 mg Oral Daily  . aspirin EC  81 mg Oral Daily  . atorvastatin  20 mg Oral QHS  . clopidogrel  75 mg Oral Daily  . dorzolamide-timolol  1 drop Both Eyes BID  . gabapentin  300 mg Oral BID  . insulin aspart  10 Units Subcutaneous TID WC  . insulin detemir  10 Units Subcutaneous QHS  . isosorbide mononitrate  60 mg Oral Daily  . magnesium oxide  400 mg Oral BID  . metoprolol succinate  100 mg Oral Daily  . mycophenolate  360 mg Oral BID  . pantoprazole  40 mg Oral Daily  . predniSONE  5 mg Oral Daily  . sodium bicarbonate  650 mg Oral BID  . sulfamethoxazole-trimethoprim  1 tablet Oral Q M,W,F  . tacrolimus  2 mg Oral QHS  . tacrolimus  3 mg Oral Daily   Continuous Infusions: . heparin 1,000 Units/hr (04/07/16 1800)   PRN Meds: acetaminophen, nitroGLYCERIN, ondansetron (ZOFRAN) IV, oxyCODONE-acetaminophen, prednisoLONE acetate   Vital Signs    Vitals:   04/08/16 0610 04/08/16 0700 04/08/16 0800 04/08/16 0806  BP: (!) 155/76 (!) 160/81 (!) 173/86 (!) 160/88  Pulse: (!) 53 (!) 55 (!) 58 72  Resp: 12 20 13 18   Temp:  97.6 F (36.4 C)  97.5 F (36.4 C)  TempSrc:  Oral  Oral  SpO2: 100% 100% 100% 99%  Weight:      Height:        Intake/Output Summary (Last 24 hours) at 04/08/16 0947 Last data filed at 04/08/16 0700  Gross per 24 hour  Intake              520 ml  Output              900 ml  Net             -380 ml   Filed Weights   04/07/16 1054 04/07/16 1408  Weight: 88.5 kg (195 lb) 91 kg (200 lb 9.9 oz)    Physical  Exam   GEN: Well nourished, well developed, in no acute distress.  HEENT: Grossly normal.  Neck: Supple, no JVD, carotid bruits, or masses. Cardiac: RRR, no murmurs, rubs, or gallops. No clubbing, cyanosis, edema.  Radials/DP/PT 2+ and equal bilaterally.  Respiratory:  Respirations regular and unlabored, clear to auscultation bilaterally. GI: Soft, nontender, nondistended, BS + x 4. MS: no deformity or atrophy. Skin: warm and dry, no rash. Neuro:  Strength and sensation are intact. Psych: AAOx3.  Normal affect.  Labs    CBC  Recent Labs  04/07/16 1105 04/08/16 0345  WBC 4.8 3.5*  NEUTROABS 3.4  --   HGB 10.7* 11.0*  HCT 33.9* 35.3*  MCV 88.7 88.9  PLT 222 884   Basic Metabolic Panel  Recent Labs  04/07/16 1105 04/08/16 0345  NA 137 136  K 3.7 4.2  CL 108 107  CO2 21* 21*  GLUCOSE 248* 280*  BUN 36* 29*  CREATININE 1.85* 1.85*  CALCIUM 9.2 9.2  Liver Function Tests No results for input(s): AST, ALT, ALKPHOS, BILITOT, PROT, ALBUMIN in the last 72 hours. No results for input(s): LIPASE, AMYLASE in the last 72 hours. Cardiac Enzymes  Recent Labs  04/07/16 1713 04/07/16 2159 04/08/16 0345  TROPONINI 3.72* 3.32* 3.71*   BNP Invalid input(s): POCBNP D-Dimer No results for input(s): DDIMER in the last 72 hours. Hemoglobin A1C No results for input(s): HGBA1C in the last 72 hours. Fasting Lipid Panel  Recent Labs  04/08/16 0345  CHOL 149  HDL 61  LDLCALC 74  TRIG 71  CHOLHDL 2.4   Thyroid Function Tests No results for input(s): TSH, T4TOTAL, T3FREE, THYROIDAB in the last 72 hours.  Invalid input(s): FREET3  Telemetry    No events - Personally Reviewed  ECG    Sinus bradycardia. Rate 51 bpm. Lateral T-wave inversions- Personally Reviewed  Radiology    Dg Chest 2 View  Result Date: 04/07/2016 CLINICAL DATA:  Left chest pain EXAM: CHEST  2 VIEW COMPARISON:  12/27/2015 FINDINGS: Lungs are clear.  No pleural effusion or pneumothorax. Mild  cardiomegaly. Mild degenerative changes of the visualized thoracolumbar spine. Cholecystectomy clips. IMPRESSION: No evidence of acute cardiopulmonary disease. Electronically Signed   By: Julian Hy M.D.   On: 04/07/2016 11:39    Cardiac Studies   n/a  Patient Profile     April Pena is a 69F with Hypertensive heart disease, hyperlipidemia, diabetes, status post renal transplant with residual chronic kidney disease here with NSTEMI.  Assessment & Plan    # NSTEMI: Troponin elevated to 3.71. She continues to have chest discomfort. She reports having difficulty with a radial heart catheterization in the past and prefers to go family. She does have chronic kidney disease after renal transplant. We discussed the risk of worsening renal function with cardiac catheterization. However, given that she continues to have chest pain and has known coronary disease, there are not many other good options besides pursuing heart cath.  Records from [facility] are pending.  Continue aspirin, heparin, Imdur, metoprolol, and atorvastatin.   # Hypertension: Continue amlodipine and metoprolol. She is not on an ACE inhibitor or ARB due to renal dysfunction.   # Hyperlipidemia:  LDL 74 this Increase atorvastatin to 40 mg to achieve LDL less than 70.admission.    Signed, Skeet Latch, MD  04/08/2016, 9:47 AM

## 2016-04-08 NOTE — Progress Notes (Signed)
Removal of Right Femoral arterial sheath without complication by [place]. Manual compression applied for 20 minutes without complication. Sterile dressing applied with tegaderm and sterile 4x4 gauze to puncture site. Upon departure form Cath Lab dressing dry and intact. Bp: 140/82, SP02: 98%, HR: 57, Pulses bilateral +2. patient given instruction and understands recovery process.

## 2016-04-08 NOTE — Consult Note (Signed)
[address]       [place] [address]             [phone]      Cardiothoracic Surgery Consultation  Reason for Consult: Severe multi-vessel coronary artery disease with NSTEMI Referring Physician: Dr. Caren Hazy is an 60 y.o. female.  HPI:   The patient is a 60 year old woman with a long history of hypertension and diabetes, renal failure s/p renal transplant, cataracts and glaucoma with left eye blindness as well as coronary artery disease s/p cath x 2 at [facility] in the past. She denies ever having a PCI but has been on Plavix for about 2 years. She was taken care of by Dr. Einar Gip until she left [facility]. 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 The pain usually occurs with exertion but has recently been occurring at rest and waking her from sleep. Yesterday she developed pain that did not go away after 4 NTG and she went to [facility]. She had some lateral T-wave inversion and ST elevation anteriorly. She received MSO4 with relief. Troponin was 0.73 on presentation to [facility]. Troponin was elevated at 3.72 yesterday afternoon here and still 3.71 this am. Creat 1.85. Cath today shows severe 2-vessel CAD with 99% proximal stenosis in OM2 and OM3, both of which are moderate-sized vessels. The RCA is diffusely diseased to 99% with left to right collat to the PDA. The LAD had 40% diffuse disease but no flow-limiting lesions. LVEDP was 18.  Past Medical History:  Diagnosis Date  . Blind left eye   . Blood transfusion without reported diagnosis   . CHF (congestive heart failure) ([place])   . Diabetes mellitus   . Glaucoma   . Glaucoma   . Hypertension   . MI (myocardial infarction)   . Renal disorder    kidney transplant  . Unstable angina [facility])     Past Surgical History:  Procedure Laterality Date  . ABDOMINAL HYSTERECTOMY    . CARDIAC CATHETERIZATION N/A 04/08/2016   Procedure: Left Heart Cath and Coronary  Angiography;  Surgeon: Burnell Blanks, MD;  Location: [place] [facility];  Service: Cardiovascular;  Laterality: N/A;  . CATARACT EXTRACTION    . CHOLECYSTECTOMY    . EYE SURGERY     cataract  . KIDNEY TRANSPLANT  2011  . TONSILLECTOMY    . TRIGGER FINGER RELEASE      Family History  Problem Relation Age of Onset  . Diabetes Mother   . Hypertension Mother   . Heart attack Mother   . Diabetes Father   . Hypertension Sister   . Diabetes Brother   . Hypertension Brother   . Diabetes Paternal Grandmother   . Diabetes Daughter   . Hypertension Daughter   . Hyperlipidemia Neg Hx   . Sudden death Neg Hx     Social History:  reports that she has quit smoking. She has never used smokeless tobacco. She reports that she does not drink alcohol or use drugs.  Allergies:  Allergies  Allergen Reactions  . Iron Hives and Other (See Comments)    Elevated blood sugar, glaucoma worsened,   . Promethazine Other (See Comments)    Pt states her muscles jerk  . Wellbutrin [Bupropion Hcl] Nausea And Vomiting    Medications:  I have reviewed the patient's current medications. Prior to Admission:  Prescriptions Prior to Admission  Medication Sig  Dispense Refill Last Dose  . acetaminophen (TYLENOL) 500 MG tablet Take 500-1,000 mg by mouth every 6 (six) hours as needed (pain).    few months ago  . amLODipine (NORVASC) 10 MG tablet Take 10 mg by mouth daily.   04/07/2016 at Unknown time  . aspirin 81 MG chewable tablet Chew 81-162 mg by mouth daily as needed (chest pain).    04/07/2016 at 730  . aspirin EC 81 MG tablet Take 81 mg by mouth daily.   04/07/2016 at 930  . atorvastatin (LIPITOR) 20 MG tablet Take 20 mg by mouth at bedtime.    04/06/2016 at Unknown time  . clopidogrel (PLAVIX) 75 MG tablet Take 75 mg by mouth daily.   04/07/2016 at Unknown time  . dexlansoprazole (DEXILANT) 60 MG capsule Take 60 mg by mouth daily.   04/07/2016 at Unknown time  . dorzolamide-timolol (COSOPT)  22.3-6.8 MG/ML ophthalmic solution Place 1 drop into both eyes 2 (two) times daily.   04/06/2016 at 2330  . gabapentin (NEURONTIN) 300 MG capsule Take 1 capsule (300 mg total) by mouth 3 (three) times daily. (Patient taking differently: Take 300 mg by mouth daily. ) 180 capsule 1 04/07/2016 at Unknown time  . insulin aspart (NOVOLOG FLEXPEN) 100 UNIT/ML FlexPen Inject 10-30 Units into the skin 3 (three) times daily with meals. Based on CBG or carb count   04/06/2016 at 1930  . Insulin Detemir (LEVEMIR FLEXTOUCH) 100 UNIT/ML Pen Inject 10-20 Units into the skin at bedtime. Per sliding scale   maybe 10/28  . isosorbide mononitrate (IMDUR) 60 MG 24 hr tablet Take 60 mg by mouth daily.     04/07/2016 at Unknown time  . magnesium oxide (MAG-OX) 400 MG tablet Take 400 mg by mouth 2 (two) times daily.     04/07/2016 at am  . metoprolol succinate (TOPROL-XL) 100 MG 24 hr tablet Take 100 mg by mouth daily. Take with or immediately following a meal.   04/07/2016 at 930  . mycophenolate (MYFORTIC) 180 MG EC tablet Take 360 mg by mouth 2 (two) times daily.    04/07/2016 at am  . nitroGLYCERIN (NITROSTAT) 0.4 MG SL tablet Place 0.4 mg under the tongue every 5 (five) minutes as needed for chest pain.   04/07/2016 at 730  . oxyCODONE-acetaminophen (PERCOCET) 7.5-325 MG per tablet Take 1 tablet by mouth 2 (two) times daily. scheduled   04/05/2016 at pm  . prednisoLONE acetate (PRED FORTE) 1 % ophthalmic suspension Place 1 drop into the left eye 4 (four) times daily as needed (itching).   few months ago  . predniSONE (DELTASONE) 5 MG tablet Take 5 mg by mouth daily.     04/07/2016 at Unknown time  . sodium bicarbonate 650 MG tablet Take 650 mg by mouth 2 (two) times daily.   04/07/2016 at am  . sulfamethoxazole-trimethoprim (BACTRIM,SEPTRA) 400-80 MG per tablet Take 1 tablet by mouth every Monday, Wednesday, and Friday.    04/05/2016  . tacrolimus (PROGRAF) 1 MG capsule Take 2-3 mg by mouth See admin instructions. Take  3 capsules (3 mg) by mouth every morning and 2 capsules (2 mg) at night   04/07/2016 at am   Scheduled: . amLODipine  10 mg Oral Daily  . aspirin EC  81 mg Oral Daily  . atorvastatin  40 mg Oral QHS  . dorzolamide-timolol  1 drop Right Eye BID  . [START ON 04/09/2016] gabapentin  300 mg Oral Daily  . insulin aspart  10 Units Subcutaneous TID  WC  . insulin detemir  10 Units Subcutaneous QHS  . isosorbide mononitrate  60 mg Oral Daily  . magnesium oxide  400 mg Oral BID  . metoprolol succinate  100 mg Oral Daily  . mycophenolate  360 mg Oral BID  . pantoprazole  40 mg Oral Daily  . predniSONE  5 mg Oral Daily  . sodium bicarbonate  650 mg Oral BID  . sodium chloride flush  3 mL Intravenous Q12H  . sulfamethoxazole-trimethoprim  1 tablet Oral Q M,W,F  . tacrolimus  2 mg Oral QHS  . tacrolimus  3 mg Oral Daily   Continuous: . sodium chloride 75 mL/hr at 04/08/16 1457  . D-10-0.45% Sodium Chloride with KCL 40 meq/L 1000 ml Stopped (04/08/16 1829)  . heparin     YQI:HKVQQV chloride, acetaminophen, nitroGLYCERIN, ondansetron (ZOFRAN) IV, oxyCODONE-acetaminophen, prednisoLONE acetate, sodium chloride flush Anti-infectives    Start     Dose/Rate Route Frequency Ordered Stop   04/08/16 1000  sulfamethoxazole-trimethoprim (BACTRIM,SEPTRA) 400-80 MG per tablet 1 tablet     1 tablet Oral Every M-W-F 04/07/16 1916        Results for orders placed or performed during the hospital encounter of 04/07/16 (from the past 48 hour(s))  CBC with Differential     Status: Abnormal   Collection Time: 04/07/16 11:05 AM  Result Value Ref Range   WBC 4.8 4.0 - 10.5 K/uL   RBC 3.82 (L) 3.87 - 5.11 MIL/uL   Hemoglobin 10.7 (L) 12.0 - 15.0 g/dL   HCT 33.9 (L) 36.0 - 46.0 %   MCV 88.7 78.0 - 100.0 fL   MCH 28.0 26.0 - 34.0 pg   MCHC 31.6 30.0 - 36.0 g/dL   RDW 13.6 11.5 - 15.5 %   Platelets 222 150 - 400 K/uL   Neutrophils Relative % 72 %   Neutro Abs 3.4 1.7 - 7.7 K/uL   Lymphocytes Relative 15 %     Lymphs Abs 0.7 0.7 - 4.0 K/uL   Monocytes Relative 11 %   Monocytes Absolute 0.5 0.1 - 1.0 K/uL   Eosinophils Relative 2 %   Eosinophils Absolute 0.1 0.0 - 0.7 K/uL   Basophils Relative 0 %   Basophils Absolute 0.0 0.0 - 0.1 K/uL  Basic metabolic panel     Status: Abnormal   Collection Time: 04/07/16 11:05 AM  Result Value Ref Range   Sodium 137 135 - 145 mmol/L   Potassium 3.7 3.5 - 5.1 mmol/L   Chloride 108 101 - 111 mmol/L   CO2 21 (L) 22 - 32 mmol/L   Glucose, Bld 248 (H) 65 - 99 mg/dL   BUN 36 (H) 6 - 20 mg/dL   Creatinine, Ser 1.85 (H) 0.44 - 1.00 mg/dL   Calcium 9.2 8.9 - 10.3 mg/dL   GFR calc non Af Amer 29 (L) >60 mL/min   GFR calc Af Amer 33 (L) >60 mL/min    Comment: (NOTE) The eGFR has been calculated using the CKD EPI equation. This calculation has not been validated in all clinical situations. eGFR's persistently <60 mL/min signify possible Chronic Kidney Disease.    Anion gap 8 5 - 15  Troponin I     Status: Abnormal   Collection Time: 04/07/16 11:05 AM  Result Value Ref Range   Troponin I 0.73 (HH) <0.03 ng/mL    Comment: CRITICAL RESULT CALLED TO, READ BACK BY AND VERIFIED WITH: MABE STEVE RN [place]  Status: None   Collection Time: 04/07/16 11:05 AM  Result Value Ref Range   Prothrombin Time 13.4 11.4 - 15.2 seconds   INR 1.02   CBG monitoring, ED     Status: Abnormal   Collection Time: 04/07/16  1:56 PM  Result Value Ref Range   Glucose-Capillary 56 (L) 65 - 99 mg/dL  Glucose, capillary     Status: Abnormal   Collection Time: 04/07/16  2:43 PM  Result Value Ref Range   Glucose-Capillary 170 (H) 65 - 99 mg/dL  MRSA PCR Screening     Status: None   Collection Time: 04/07/16  3:04 PM  Result Value Ref Range   MRSA by PCR NEGATIVE NEGATIVE    Comment:        The GeneXpert MRSA Assay (FDA approved for NASAL specimens only), is one component of a comprehensive MRSA colonization surveillance program. It is  not intended to diagnose MRSA infection nor to guide or monitor treatment for MRSA infections.   Glucose, capillary     Status: Abnormal   Collection Time: 04/07/16  4:04 PM  Result Value Ref Range   Glucose-Capillary 164 (H) 65 - 99 mg/dL   Comment 1 Capillary Specimen   Troponin I     Status: Abnormal   Collection Time: 04/07/16  5:13 PM  Result Value Ref Range   Troponin I 3.72 (HH) <0.03 ng/mL    Comment: CRITICAL RESULT CALLED TO, READ BACK BY AND VERIFIED WITH: BARLOWARN [id number] [place]   Heparin level (unfractionated)     Status: None   Collection Time: 04/07/16  7:04 PM  Result Value Ref Range   Heparin Unfractionated 0.55 0.30 - 0.70 IU/mL    Comment:        IF HEPARIN RESULTS ARE BELOW EXPECTED VALUES, AND PATIENT DOSAGE HAS BEEN CONFIRMED, SUGGEST FOLLOW UP TESTING OF ANTITHROMBIN III LEVELS.   Glucose, capillary     Status: Abnormal   Collection Time: 04/07/16  9:46 PM  Result Value Ref Range   Glucose-Capillary 367 (H) 65 - 99 mg/dL   Comment 1 Capillary Specimen   Troponin I     Status: Abnormal   Collection Time: 04/07/16  9:59 PM  Result Value Ref Range   Troponin I 3.32 (HH) <0.03 ng/mL    Comment: CRITICAL VALUE NOTED.  VALUE IS CONSISTENT WITH PREVIOUSLY REPORTED AND CALLED VALUE.  Troponin I     Status: Abnormal   Collection Time: 04/08/16  3:45 AM  Result Value Ref Range   Troponin I 3.71 (HH) <0.03 ng/mL    Comment: CRITICAL VALUE NOTED.  VALUE IS CONSISTENT WITH PREVIOUSLY REPORTED AND CALLED VALUE.  CBC     Status: Abnormal   Collection Time: 04/08/16  3:45 AM  Result Value Ref Range   WBC 3.5 (L) 4.0 - 10.5 K/uL   RBC 3.97 3.87 - 5.11 MIL/uL   Hemoglobin 11.0 (L) 12.0 - 15.0 g/dL   HCT 35.3 (L) 36.0 - 46.0 %   MCV 88.9 78.0 - 100.0 fL   MCH 27.7 26.0 - 34.0 pg   MCHC 31.2 30.0 - 36.0 g/dL   RDW 14.1 11.5 - 15.5 %   Platelets 223 150 - 400 K/uL  Heparin level (unfractionated)     Status: None   Collection Time: 04/08/16  3:45 AM   Result Value Ref Range   Heparin Unfractionated 0.50 0.30 - 0.70 IU/mL    Comment:        IF HEPARIN RESULTS ARE BELOW  EXPECTED VALUES, AND PATIENT DOSAGE HAS BEEN CONFIRMED, SUGGEST FOLLOW UP TESTING OF ANTITHROMBIN III LEVELS.   Basic metabolic panel     Status: Abnormal   Collection Time: 04/08/16  3:45 AM  Result Value Ref Range   Sodium 136 135 - 145 mmol/L   Potassium 4.2 3.5 - 5.1 mmol/L   Chloride 107 101 - 111 mmol/L   CO2 21 (L) 22 - 32 mmol/L   Glucose, Bld 280 (H) 65 - 99 mg/dL   BUN 29 (H) 6 - 20 mg/dL   Creatinine, Ser 1.85 (H) 0.44 - 1.00 mg/dL   Calcium 9.2 8.9 - 10.3 mg/dL   GFR calc non Af Amer 29 (L) >60 mL/min   GFR calc Af Amer 33 (L) >60 mL/min    Comment: (NOTE) The eGFR has been calculated using the CKD EPI equation. This calculation has not been validated in all clinical situations. eGFR's persistently <60 mL/min signify possible Chronic Kidney Disease.    Anion gap 8 5 - 15  Lipid panel     Status: None   Collection Time: 04/08/16  3:45 AM  Result Value Ref Range   Cholesterol 149 0 - 200 mg/dL   Triglycerides 71 <150 mg/dL   HDL 61 >40 mg/dL   Total CHOL/HDL Ratio 2.4 RATIO   VLDL 14 0 - 40 mg/dL   LDL Cholesterol 74 0 - 99 mg/dL    Comment:        Total Cholesterol/HDL:CHD Risk Coronary Heart Disease Risk Table                     Men   Women  1/2 Average Risk   3.4   3.3  Average Risk       5.0   4.4  2 X Average Risk   9.6   7.1  3 X Average Risk  23.4   11.0        Use the calculated Patient Ratio above and the CHD Risk Table to determine the patient's CHD Risk.        ATP III CLASSIFICATION (LDL):  <100     mg/dL   Optimal  100-129  mg/dL   Near or Above                    Optimal  130-159  mg/dL   Borderline  160-189  mg/dL   High  >190     mg/dL   Very High   Protime-INR     Status: None   Collection Time: 04/08/16  3:48 AM  Result Value Ref Range   Prothrombin Time 13.0 11.4 - 15.2 seconds   INR 0.98   Glucose,  capillary     Status: Abnormal   Collection Time: 04/08/16  7:53 AM  Result Value Ref Range   Glucose-Capillary 183 (H) 65 - 99 mg/dL   Comment 1 Capillary Specimen   Glucose, capillary     Status: Abnormal   Collection Time: 04/08/16 10:53 AM  Result Value Ref Range   Glucose-Capillary 36 (LL) 65 - 99 mg/dL  Glucose, capillary     Status: Abnormal   Collection Time: 04/08/16 11:04 AM  Result Value Ref Range   Glucose-Capillary 175 (H) 65 - 99 mg/dL   Comment 1 Capillary Specimen   Glucose, capillary     Status: Abnormal   Collection Time: 04/08/16 12:28 PM  Result Value Ref Range   Glucose-Capillary 51 (L) 65 - 99 mg/dL  Comment 1 Capillary Specimen   Glucose, capillary     Status: Abnormal   Collection Time: 04/08/16 12:53 PM  Result Value Ref Range   Glucose-Capillary 101 (H) 65 - 99 mg/dL   Comment 1 Capillary Specimen   POCT Activated clotting time     Status: None   Collection Time: 04/08/16  2:05 PM  Result Value Ref Range   Activated Clotting Time 142 seconds    Dg Chest 2 View  Result Date: 04/07/2016 CLINICAL DATA:  Left chest pain EXAM: CHEST  2 VIEW COMPARISON:  12/27/2015 FINDINGS: Lungs are clear.  No pleural effusion or pneumothorax. Mild cardiomegaly. Mild degenerative changes of the visualized thoracolumbar spine. Cholecystectomy clips. IMPRESSION: No evidence of acute cardiopulmonary disease. Electronically Signed   By: Julian Hy M.D.   On: 04/07/2016 11:39    Review of Systems  Constitutional: Positive for malaise/fatigue.  HENT: Negative.   Eyes:       Blind left eye.  Respiratory: Positive for shortness of breath. Negative for cough and sputum production.   Cardiovascular: Positive for chest pain. Negative for palpitations, orthopnea, leg swelling and PND.  Gastrointestinal: Negative.   Genitourinary: Negative.   Musculoskeletal: Negative.   Skin: Negative.   Neurological: Negative for dizziness (.logo) and seizures ( ).    Endo/Heme/Allergies: Negative.  Polydipsia:    Psychiatric/Behavioral: Negative.    Blood pressure (!) 102/47, pulse 64, temperature 98.1 F (36.7 C), temperature source Oral, resp. rate (!) 21, height '5\' 3"'$  (1.6 m), weight 91 kg (200 lb 9.9 oz), SpO2 99 %. Physical Exam  Constitutional: She is oriented to person, place, and time. No distress.  Obese, looks older than stated age.  HENT:  Head: Normocephalic and atraumatic.  Mouth/Throat: Oropharynx is clear and moist.  Eyes:  Left cornea hazy and pupil not reactive.  Neck: Normal range of motion. Neck supple. No JVD present. No thyromegaly present.  Cardiovascular: Normal rate, regular rhythm, normal heart sounds and intact distal pulses.   No murmur heard. Respiratory: Effort normal and breath sounds normal. No respiratory distress. She has no wheezes. She has no rales.  GI: Soft. Bowel sounds are normal. She exhibits no distension. There is no tenderness.  Musculoskeletal: She exhibits no edema or tenderness.  Lymphadenopathy:    She has no cervical adenopathy.  Neurological: She is alert and oriented to person, place, and time. She has normal strength.  Skin: Skin is warm and dry.  Psychiatric: She has a normal mood and affect.   Physicians   Panel Physicians Referring Physician Case Authorizing Physician  Burnell Blanks, MD (Primary)    Procedures   Left Heart Cath and Coronary Angiography  Conclusion     2nd Mrg lesion, 99 %stenosed.  Dist Cx-2 lesion, 100 %stenosed.  Dist Cx-1 lesion, 30 %stenosed.  3rd Mrg lesion, 99 %stenosed.  Prox RCA to Mid RCA lesion, 80 %stenosed.  Mid RCA to Dist RCA lesion, 99 %stenosed.  Ost 1st Diag to 1st Diag lesion, 99 %stenosed.  Mid LAD lesion, 40 %stenosed.  Dist LAD-1 lesion, 40 %stenosed.  Dist LAD-2 lesion, 40 %stenosed.   1. Severe double vessel CAD 2. The RCA is diffusely diseased in the proximal, mid and distal segments. This vessel is moderate in  caliber but heavily calcified. This vessel is not a favorable target for PCI. (Of note, PCI of the RCA attempted at [facility] in 2016 and unsuccessful).  3. The Circumflex is a large caliber vessel with high  grade stenosis in a moderate caliber obtuse marginal branch and total occlusion of the mid AV groove segment. This entire vessel is heavily calcified and not favorable for PCI.  4. Moderate non-obstructive heavily calcified disease in the proximal, mid and distal LAD.  5. Chronic kidney disease, s/p renal transplant.   Recommendations: She has severe CAD and this is not favorable for PCI based on length of lesions, heavy calcification and chronic total occlusion of the Circumflex.  I will ask CT surgery to see her to discuss bypass of the RCA and Circumflex. The LAD has no flow limiting lesions. If she is not felt to be a surgical candidate, would pursue medical management only. Will hold Plavix while surgical evaluation is pending. Will resume heparin drip 8 hours post sheath pull.      Indications   NSTEMI (non-ST elevated myocardial infarction) ([place]) [I21.4 (ICD-10-CM)]  Procedural Details/Technique   Technical Details Indication: 60 yo female with history of DM, CKD s/p renal transplant, severe CAD admitted with unstable angina/NSTEMI. She has had her previous care at [facility]. Last cath in January 2016 with severe disease in the RCA with failed PCI of the RCA and severe diffuse disease in the Circumflex and OM branches. Medical management recommended. Now admitted to [facility] with unstable angina/NSTEMI.   Procedure: The risks, benefits, complications, treatment options, and expected outcomes were discussed with the patient. The patient and/or family concurred with the proposed plan, giving informed consent. The patient was brought to the cath lab after IV hydration was begun and oral premedication was given. The patient was further sedated with Versed and  Fentanyl. She refused to consider catheterization from the wrist as she had a bad experience with this at [facility]. The right groin was prepped and draped. A 5 French sheath was placed in the right femoral artery. Standard diagnostic catheters were used to perform selective coronary angiography. I crossed the aortic valve with a JR4 catheter. LV pressures measured. The sheath was removed from the right femoral artery and manual pressure applied.   Estimated blood loss <50 mL.  During this procedure the patient was administered the following to achieve and maintain moderate conscious sedation: Versed 2 mg, Fentanyl 25 mcg, while the patient's heart rate, blood pressure, and oxygen saturation were continuously monitored. The period of conscious sedation was 16 minutes, of which I was present face-to-face 100% of this time.    Complications   Complications documented before study signed (04/08/2016 3:10 PM EDT)    LEFT HEART CATH AND CORONARY ANGIOGRAPHY   None Documented by Burnell Blanks, MD 04/08/2016 2:41 PM EDT  Time Range: Intra-procedure      Coronary Findings   Dominance: Co-dominant  Left Anterior Descending  Mid LAD lesion, 40% stenosed. The lesion is calcified.  Dist LAD-1 lesion, 40% stenosed. The lesion is calcified.  Dist LAD-2 lesion, 40% stenosed. The lesion is calcified.  First Diagonal Branch  Vessel is small in size.  Ost 1st Diag to 1st Diag lesion, 99% stenosed.  Second Diagonal Branch  Vessel is small in size.  Third Diagonal Branch  Vessel is small in size.  Left Circumflex  Dist Cx filled by collaterals from Dist LAD.  Dist Cx-1 lesion, 30% stenosed. The lesion is calcified.  Dist Cx-2 lesion, 100% stenosed. The lesion is calcified.  First Obtuse Marginal Branch  Vessel is small in size.  Second Obtuse Marginal Branch  Vessel is moderate in size.  2nd Mrg lesion, 99% stenosed.  The lesion is calcified.  Third Obtuse Marginal Branch  3rd Mrg  filled by collaterals from 2nd Mrg.  3rd Mrg lesion, 99% stenosed.  Right Coronary Artery  Prox RCA to Mid RCA lesion, 80% stenosed. The lesion is calcified.  Mid RCA to Dist RCA lesion, 99% stenosed. The lesion is calcified.  Right Posterior Descending Artery  RPDA filled by collaterals from Dist LAD.  Coronary Diagrams   Diagnostic Diagram     Implants     No implant documentation for this case.  PACS Images   Show images for Cardiac catheterization   Link to Procedure Log   Procedure Log    Hemo Data   Flowsheet Row Most Recent Value  AO Systolic Pressure 683 mmHg  AO Diastolic Pressure 65 mmHg  AO Mean 88 mmHg  LV Systolic Pressure 729 mmHg  LV Diastolic Pressure 9 mmHg  LV EDP 19 mmHg  Arterial Occlusion Pressure Extended Systolic Pressure 021 mmHg  Arterial Occlusion Pressure Extended Diastolic Pressure 63 mmHg  Arterial Occlusion Pressure Extended Mean Pressure 89 mmHg  Left Ventricular Apex Extended Systolic Pressure 115 mmHg  Left Ventricular Apex Extended Diastolic Pressure 9 mmHg  Left Ventricular Apex Extended EDP Pressure 18 mmHg    Assessment/Plan:  This 60 year old woman with diabetes, hypertension and a kidney transplant has severe 2-vessel coronary artery disease presenting with a NSTEMI. I have personally reviewed and interpreted her cardiac cath and I agree that CABG is the best treatment for her. She will need an echo to assess her LV and valvular function. She has been on Plavix and last dose was yesterday am so the earliest I would do her surgery would be Friday afternoon, assuming that her P2Y12 level is ok on Thursday and her kidney function remains stable. I discussed the operative procedure with the patient with her sister listening on the phone including alternatives, benefits and risks; including but not limited to bleeding, blood transfusion, infection, stroke, myocardial infarction, graft failure, heart block requiring a permanent pacemaker,  organ dysfunction, and death.  [name] understands and agrees to proceed.  We will schedule surgery tentatively for Friday afternoon.  I spent 80 minutes performing this consultation and > 50% of this time was spent face to face counseling and coordinating the care of this patient's severe multi-vessel coronary artery disease.  Gaye Pollack 04/08/2016, 7:39 PM

## 2016-04-09 ENCOUNTER — Inpatient Hospital Stay (HOSPITAL_COMMUNITY): Payer: Medicare Other

## 2016-04-09 ENCOUNTER — Inpatient Hospital Stay (HOSPITAL_COMMUNITY): Payer: Medicaid Other

## 2016-04-09 DIAGNOSIS — I2510 Atherosclerotic heart disease of native coronary artery without angina pectoris: Secondary | ICD-10-CM

## 2016-04-09 LAB — ECHOCARDIOGRAM COMPLETE
AOASC: 32 cm
AVLVOTPG: 6 mmHg
CHL CUP DOP CALC LVOT VTI: 25 cm
CHL CUP MV DEC (S): 338
EWDT: 338 ms
FS: 54 % — AB (ref 28–44)
Height: 63 in
IVS/LV PW RATIO, ED: 1
LA diam end sys: 33 mm
LA vol A4C: 59.2 ml
LADIAMINDEX: 1.61 cm/m2
LASIZE: 33 mm
LAVOL: 58.2 mL
LAVOLIN: 28.4 mL/m2
LVELAT: 4.79 cm/s
LVOT area: 2.84 cm2
LVOT diameter: 19 mm
LVOTPV: 119 cm/s
LVOTSV: 71 mL
Lateral S' vel: 12.4 cm/s
MVPKEVEL: 1 m/s
PW: 13 mm — AB (ref 0.6–1.1)
TAPSE: 20.4 mm
TDI e' lateral: 4.79
TDI e' medial: 3.05
Weight: 3209.9 oz

## 2016-04-09 LAB — GLUCOSE, CAPILLARY
GLUCOSE-CAPILLARY: 159 mg/dL — AB (ref 65–99)
GLUCOSE-CAPILLARY: 395 mg/dL — AB (ref 65–99)
Glucose-Capillary: 164 mg/dL — ABNORMAL HIGH (ref 65–99)
Glucose-Capillary: 184 mg/dL — ABNORMAL HIGH (ref 65–99)
Glucose-Capillary: 364 mg/dL — ABNORMAL HIGH (ref 65–99)
Glucose-Capillary: 401 mg/dL — ABNORMAL HIGH (ref 65–99)
Glucose-Capillary: 69 mg/dL (ref 65–99)

## 2016-04-09 LAB — CBC
HEMATOCRIT: 32.5 % — AB (ref 36.0–46.0)
HEMOGLOBIN: 10.4 g/dL — AB (ref 12.0–15.0)
MCH: 28 pg (ref 26.0–34.0)
MCHC: 32 g/dL (ref 30.0–36.0)
MCV: 87.4 fL (ref 78.0–100.0)
Platelets: 215 10*3/uL (ref 150–400)
RBC: 3.72 MIL/uL — AB (ref 3.87–5.11)
RDW: 14 % (ref 11.5–15.5)
WBC: 3.8 10*3/uL — AB (ref 4.0–10.5)

## 2016-04-09 LAB — HEPARIN LEVEL (UNFRACTIONATED)
HEPARIN UNFRACTIONATED: 0.65 [IU]/mL (ref 0.30–0.70)
HEPARIN UNFRACTIONATED: 0.94 [IU]/mL — AB (ref 0.30–0.70)
Heparin Unfractionated: 0.1 IU/mL — ABNORMAL LOW (ref 0.30–0.70)

## 2016-04-09 MED ORDER — CARVEDILOL 12.5 MG PO TABS
12.5000 mg | ORAL_TABLET | Freq: Two times a day (BID) | ORAL | Status: DC
  Administered 2016-04-09 – 2016-04-12 (×7): 12.5 mg via ORAL
  Filled 2016-04-09 (×7): qty 1

## 2016-04-09 NOTE — Progress Notes (Addendum)
[facility] for Heparin Indication: chest pain/ACS  Allergies  Allergen Reactions  . Iron Hives and Other (See Comments)    Elevated blood sugar, glaucoma worsened,   . Promethazine Other (See Comments)    Pt states her muscles jerk  . Wellbutrin [Bupropion Hcl] Nausea And Vomiting    Patient Measurements: Height: 5\' 3"  (160 cm) Weight: 200 lb 9.9 oz (91 kg) IBW/kg (Calculated) : 52.4 Heparin Dosing Weight: 74.4 kg  Vital Signs: Temp: 98.5 F (36.9 C) (10/31 1945) Temp Source: Oral (10/31 1945) BP: 134/79 (10/31 1945) Pulse Rate: 70 (10/31 2000)  Labs:  Recent Labs  04/07/16 1105 04/07/16 1713  04/07/16 2159 04/08/16 0345 04/08/16 0348 04/09/16 0414 04/09/16 0415 04/09/16 1146 04/09/16 2030  HGB 10.7*  --   --   --  11.0*  --  10.4*  --   --   --   HCT 33.9*  --   --   --  35.3*  --  32.5*  --   --   --   PLT 222  --   --   --  223  --  215  --   --   --   LABPROT 13.4  --   --   --   --  13.0  --   --   --   --   INR 1.02  --   --   --   --  0.98  --   --   --   --   HEPARINUNFRC  --   --   < >  --  0.50  --   --  0.10* 0.65 0.94*  CREATININE 1.85*  --   --   --  1.85*  --   --   --   --   --   TROPONINI 0.73* 3.72*  --  3.32* 3.71*  --   --   --   --   --   < > = values in this interval not displayed.  Estimated Creatinine Clearance: 34.6 mL/min (by C-G formula based on SCr of 1.85 mg/dL (H)).  Assessment:  60 y.o. female with CAD awaiting CABG after plavix washout, continuing on heparin. Not on anticoagulation PTA. CBC stable, no bleed documented.  Heparin level now SUPRA-therapeutic at 0.94 on 1200 units/hr. Appears to be accumulating. No issues with infusion. No bleeding.   Goal of Therapy:  Heparin level 0.3-0.7 units/ml Monitor platelets by anticoagulation protocol: Yes   Plan:  Heparin IV at 1050 units/hr Daily heparin level/CBC Monitor for s/sx bleeding CABG scheduled for 11/3   Sloan Leiter, PharmD,  BCPS Clinical Pharmacist [phone] 04/09/2016 9:11 PM

## 2016-04-09 NOTE — Progress Notes (Signed)
CARDIAC REHAB PHASE I   PRE:  Rate/Rhythm: 80 SR  BP:  Sitting: 113/65        SaO2: 100 RA  MODE:  Ambulation: 350 ft   POST:  Rate/Rhythm: 98 SR  BP:  Sitting: 110/62         SaO2: 100 RA  Pt ambulated 350 ft on RA, IV, rolling walker, assist x1, steady gait, tolerated well with no complaints. Cardiac surgery pre-op education completed. Reviewed IS, sternal precautions, activity progression, cardiac surgery booklet and cardiac surgery guidelines. Left instructions for pt to view cardiac surgery videos. Pt verbalized understanding. Pt to recliner after walk, call bell within reach. Will follow.   Rew, RN, BSN 04/09/2016 3:19 PM

## 2016-04-09 NOTE — Progress Notes (Signed)
[facility] for Heparin Indication: chest pain/ACS  Allergies  Allergen Reactions  . Iron Hives and Other (See Comments)    Elevated blood sugar, glaucoma worsened,   . Promethazine Other (See Comments)    Pt states her muscles jerk  . Wellbutrin [Bupropion Hcl] Nausea And Vomiting    Patient Measurements: Height: 5\' 3"  (160 cm) Weight: 200 lb 9.9 oz (91 kg) IBW/kg (Calculated) : 52.4 Heparin Dosing Weight: 74.4 kg  Vital Signs: Temp: 97.8 F (36.6 C) (10/31 0753) Temp Source: Oral (10/31 0753) BP: 179/85 (10/31 0753) Pulse Rate: 52 (10/31 0753)  Labs:  Recent Labs  04/07/16 1105 04/07/16 1713 04/07/16 1904 04/07/16 2159 04/08/16 0345 04/08/16 0348 04/09/16 0414 04/09/16 0415  HGB 10.7*  --   --   --  11.0*  --  10.4*  --   HCT 33.9*  --   --   --  35.3*  --  32.5*  --   PLT 222  --   --   --  223  --  215  --   LABPROT 13.4  --   --   --   --  13.0  --   --   INR 1.02  --   --   --   --  0.98  --   --   HEPARINUNFRC  --   --  0.55  --  0.50  --   --  0.10*  CREATININE 1.85*  --   --   --  1.85*  --   --   --   TROPONINI 0.73* 3.72*  --  3.32* 3.71*  --   --   --     Estimated Creatinine Clearance: 34.6 mL/min (by C-G formula based on SCr of 1.85 mg/dL (H)).  Assessment:  60 y.o. female with CAD awaiting CABG after plavix washout, continuing on heparin. Not on anticoagulation PTA. CBC stable, no bleed documented.  Heparin level now therapeutic (0.65) after rate increase.  Goal of Therapy:  Heparin level 0.3-0.7 units/ml Monitor platelets by anticoagulation protocol: Yes   Plan:  Heparin IV at 1200 units/h 8h heparin level to confirm Daily heparin level/CBC Monitor for s/sx bleeding CABG scheduled for 11/3   Elicia Lamp, PharmD, BCPS Clinical Pharmacist 04/09/2016 9:14 AM

## 2016-04-09 NOTE — Progress Notes (Signed)
Inpatient Diabetes Program Recommendations  AACE/ADA: New Consensus Statement on Inpatient Glycemic Control (2015)  Target Ranges:  Prepandial:   less than 140 mg/dL      Peak postprandial:   less than 180 mg/dL (1-2 hours)      Critically ill patients:  140 - 180 mg/dL   Lab Results  Component Value Date   GLUCAP 159 (H) 04/09/2016    Review of Glycemic Control Results for April Pena, April Pena (MRN [id number]) as of 04/09/2016 10:53  Ref. Range 04/08/2016 07:53 04/08/2016 10:53 04/08/2016 11:04 04/08/2016 12:28 04/08/2016 12:53 04/08/2016 22:56 04/09/2016 00:42 04/09/2016 08:52  Glucose-Capillary Latest Ref Range: 65 - 99 mg/dL 183 (H) 36 (LL) 175 (H) 51 (L) 101 (H) 454 (H) 364 (H) 159 (H)   Diabetes history: DM Outpatient Diabetes medications: Levemir 10-20 units qd+Novolog 10-30 units tid with meals Current orders for Inpatient glycemic control: Levemir 10 units hs + Novolog 10 units tid meal coverage  Inpatient Diabetes Program Recommendations:  Please consider: -Glycemic control order set ac & hs sensitive correction scale -Decrease Novolog meal coverage to 5 units tid if eats 50%. Noted postprandial hypoglycemia.  Thank you, Nani Gasser. Samary Shatz, RN, MSN, CDE Inpatient Glycemic Control Team Team Pager [phone] (8am-5pm) 04/09/2016 10:56 AM

## 2016-04-09 NOTE — Progress Notes (Signed)
2114: Patient CBG 401. No sliding scale coverage. 1o units of bedtime Levemir given per order. MD paged.   2215: Cards Fellow returned page, verbal order to recheck blood sugar at 2300 and call with results. Will initiate orders.

## 2016-04-09 NOTE — Progress Notes (Signed)
Echocardiogram 2D Echocardiogram has been performed.  April Pena 04/09/2016, 1:04 PM

## 2016-04-09 NOTE — Progress Notes (Signed)
[facility] for Heparin Indication: chest pain/ACS  Allergies  Allergen Reactions  . Iron Hives and Other (See Comments)    Elevated blood sugar, glaucoma worsened,   . Promethazine Other (See Comments)    Pt states her muscles jerk  . Wellbutrin [Bupropion Hcl] Nausea And Vomiting    Patient Measurements: Height: 5\' 3"  (160 cm) Weight: 200 lb 9.9 oz (91 kg) IBW/kg (Calculated) : 52.4 Heparin Dosing Weight: 74.4 kg  Vital Signs: Temp: 98.1 F (36.7 C) (10/31 0331) Temp Source: Oral (10/31 0331) BP: 160/86 (10/31 0331) Pulse Rate: 63 (10/31 0331)  Labs:  Recent Labs  04/07/16 1105 04/07/16 1713 04/07/16 1904 04/07/16 2159 04/08/16 0345 04/08/16 0348 04/09/16 0414 04/09/16 0415  HGB 10.7*  --   --   --  11.0*  --  10.4*  --   HCT 33.9*  --   --   --  35.3*  --  32.5*  --   PLT 222  --   --   --  223  --  215  --   LABPROT 13.4  --   --   --   --  13.0  --   --   INR 1.02  --   --   --   --  0.98  --   --   HEPARINUNFRC  --   --  0.55  --  0.50  --   --  0.10*  CREATININE 1.85*  --   --   --  1.85*  --   --   --   TROPONINI 0.73* 3.72*  --  3.32* 3.71*  --   --   --     Estimated Creatinine Clearance: 34.6 mL/min (by C-G formula based on SCr of 1.85 mg/dL (H)).  Assessment:  60 y.o. female with CAD awaiting CABG for heparin  Goal of Therapy:  Heparin level 0.3-0.7 units/ml Monitor platelets by anticoagulation protocol: Yes   Plan:  .Increase Heparin 1200 units/hr Check heparin level in 8 hours.  Phillis Knack, PharmD, BCPS   04/09/2016 5:11 AM

## 2016-04-09 NOTE — Progress Notes (Signed)
Patient Name: April Pena Date of Encounter: 04/09/2016  Primary Cardiologist: (new-[facility]och[facility])  Hospital Problem List     Active Problems:   Troponin level elevated   NSTEMI (non-ST elevated myocardial infarction) [facility])    Subjective   April Pena is feeling well. She denies recurrent chest pain.   Inpatient Medications    Scheduled Meds: . amLODipine  10 mg Oral Daily  . aspirin EC  81 mg Oral Daily  . atorvastatin  40 mg Oral QHS  . dorzolamide-timolol  1 drop Right Eye BID  . gabapentin  300 mg Oral Daily  . insulin aspart  10 Units Subcutaneous TID WC  . insulin detemir  10 Units Subcutaneous QHS  . isosorbide mononitrate  60 mg Oral Daily  . magnesium oxide  400 mg Oral BID  . metoprolol succinate  100 mg Oral Daily  . mycophenolate  360 mg Oral BID  . pantoprazole  40 mg Oral Daily  . predniSONE  5 mg Oral Daily  . sodium bicarbonate  650 mg Oral BID  . sodium chloride flush  3 mL Intravenous Q12H  . sulfamethoxazole-trimethoprim  1 tablet Oral Q M,W,F  . tacrolimus  2 mg Oral QHS  . tacrolimus  3 mg Oral Daily   Continuous Infusions: . D-10-0.45% Sodium Chloride with KCL 40 meq/L 1000 ml Stopped (04/08/16 1829)  . heparin 1,200 Units/hr (04/09/16 0512)   PRN Meds: sodium chloride, acetaminophen, nitroGLYCERIN, ondansetron (ZOFRAN) IV, oxyCODONE-acetaminophen, prednisoLONE acetate, sodium chloride flush   Vital Signs    Vitals:   04/09/16 0400 04/09/16 0500 04/09/16 0600 04/09/16 0753  BP:    (!) 179/85  Pulse: (!) 55 (!) 48 60 (!) 52  Resp: 15 16 (!) 21 13  Temp:    97.8 F (36.6 C)  TempSrc:    Oral  SpO2: 100% 99% 100% 100%  Weight:      Height:        Intake/Output Summary (Last 24 hours) at 04/09/16 0758 Last data filed at 04/09/16 0753  Gross per 24 hour  Intake          1753.99 ml  Output             1750 ml  Net             3.99 ml   Filed Weights   04/07/16 1054 04/07/16 1408  Weight: 88.5 kg (195 lb) 91 kg (200 lb 9.9 oz)     Physical Exam   GEN: Well nourished, well developed, in no acute distress.  HEENT: Grossly normal.  Neck: Supple, no JVD, carotid bruits, or masses. Cardiac: RRR, no murmurs, rubs, or gallops. No clubbing, cyanosis, edema.  Radials/DP/PT 2+ and equal bilaterally.  Respiratory:  Respirations regular and unlabored, clear to auscultation bilaterally. GI: Soft, nontender, nondistended, BS + x 4. MS: no deformity or atrophy. Skin: warm and dry, no rash. Neuro:  Strength and sensation are intact. Psych: AAOx3.  Normal affect.  Labs    CBC  Recent Labs  04/07/16 1105 04/08/16 0345 04/09/16 0414  WBC 4.8 3.5* 3.8*  NEUTROABS 3.4  --   --   HGB 10.7* 11.0* 10.4*  HCT 33.9* 35.3* 32.5*  MCV 88.7 88.9 87.4  PLT 222 223 381   Basic Metabolic Panel  Recent Labs  04/07/16 1105 04/08/16 0345  NA 137 136  K 3.7 4.2  CL 108 107  CO2 21* 21*  GLUCOSE 248* 280*  BUN 36* 29*  CREATININE 1.85* 1.85*  CALCIUM 9.2 9.2   Liver Function Tests No results for input(s): AST, ALT, ALKPHOS, BILITOT, PROT, ALBUMIN in the last 72 hours. No results for input(s): LIPASE, AMYLASE in the last 72 hours. Cardiac Enzymes  Recent Labs  04/07/16 1713 04/07/16 2159 04/08/16 0345  TROPONINI 3.72* 3.32* 3.71*   BNP Invalid input(s): POCBNP D-Dimer No results for input(s): DDIMER in the last 72 hours. Hemoglobin A1C No results for input(s): HGBA1C in the last 72 hours. Fasting Lipid Panel  Recent Labs  04/08/16 0345  CHOL 149  HDL 61  LDLCALC 74  TRIG 71  CHOLHDL 2.4   Thyroid Function Tests No results for input(s): TSH, T4TOTAL, T3FREE, THYROIDAB in the last 72 hours.  Invalid input(s): FREET3  Telemetry    Sinus bradycardia - Personally Reviewed  ECG    Sinus bradycardia. Rate 51 bpm. Lateral T-wave inversions- Personally Reviewed  Radiology    Dg Chest 2 View  Result Date: 04/07/2016 CLINICAL DATA:  Left chest pain EXAM: CHEST  2 VIEW COMPARISON:  12/27/2015  FINDINGS: Lungs are clear.  No pleural effusion or pneumothorax. Mild cardiomegaly. Mild degenerative changes of the visualized thoracolumbar spine. Cholecystectomy clips. IMPRESSION: No evidence of acute cardiopulmonary disease. Electronically Signed   By: Julian Hy M.D.   On: 04/07/2016 11:39    Cardiac Studies   LHC 04/08/16:  2nd Mrg lesion, 99 %stenosed.  Dist Cx-2 lesion, 100 %stenosed.  Dist Cx-1 lesion, 30 %stenosed.  3rd Mrg lesion, 99 %stenosed.  Prox RCA to Mid RCA lesion, 80 %stenosed.  Mid RCA to Dist RCA lesion, 99 %stenosed.  Ost 1st Diag to 1st Diag lesion, 99 %stenosed.  Mid LAD lesion, 40 %stenosed.  Dist LAD-1 lesion, 40 %stenosed.  Dist LAD-2 lesion, 40 %stenosed.   1. Severe double vessel CAD 2. The RCA is diffusely diseased in the proximal, mid and distal segments. This vessel is moderate in caliber but heavily calcified. This vessel is not a favorable target for PCI. (Of note, PCI of the RCA attempted at [facility] in 2016 and unsuccessful).  3. The Circumflex is a large caliber vessel with high grade stenosis in a moderate caliber obtuse marginal branch and total occlusion of the mid AV groove segment. This entire vessel is heavily calcified and not favorable for PCI.  4. Moderate non-obstructive heavily calcified disease in the proximal, mid and distal LAD.  5. Chronic kidney disease, s/p renal transplant.    Echo pending  Patient Profile     April Pena is a 81F with Hypertensive heart disease, hyperlipidemia, diabetes, status post renal transplant with residual chronic kidney disease here with NSTEMI.  Assessment & Plan    # NSTEMI: Troponin elevated to 3.71.  She was found to have severe two vessel CAD  (RCA, OM2, OM3, LCx) with non-obstructive LAD disease.  Given the complexity of the lesions and the fact that the RCA was attempted unsuccessfully at [facility], CABG was felt to be her best option.  Plan for CABG Friday with Dr.  Cyndia Bent.  Continue aspirin, heparin, Imdur, metoprolol, and atorvastatin.  She should be on a P2Y12 agent for one year post CABG.  # Hypertensive heart disease: BP poorly controlled.  Continue amlodipine.  We will switch metoprolol to carvedilol due to bradycardia and poorly-controlled BP.  She is not on an ACE inhibitor or ARB due to renal dysfunction.   # Hyperlipidemia:  LDL 74 this admission.  Increase atorvastatin to 40 mg to achieve LDL less than 70.  Signed, Skeet Latch, MD  04/09/2016, 7:58 AM

## 2016-04-09 NOTE — Progress Notes (Signed)
1 Day Post-Op Procedure(s) (LRB): Left Heart Cath and Coronary Angiography (N/A) Subjective:  Feels ok. She walked some in the hall. Brief chest pain with exertion in the room. Same pain she has been having at home.  Objective: Vital signs in last 24 hours: Temp:  [97.4 F (36.3 C)-98.4 F (36.9 C)] 98.1 F (36.7 C) (10/31 1313) Pulse Rate:  [48-71] 67 (10/31 1500) Cardiac Rhythm: Normal sinus rhythm (10/31 0805) Resp:  [11-24] 23 (10/31 1500) BP: (102-179)/(47-129) 110/62 (10/31 1500) SpO2:  [97 %-100 %] 100 % (10/31 1500)  Hemodynamic parameters for last 24 hours:    Intake/Output from previous day: 10/30 0701 - 10/31 0700 In: 1754 [P.O.:480; I.V.:1274] Out: 1600 [Urine:1600] Intake/Output this shift: Total I/O In: -  Out: 150 [Urine:150]  General appearance: alert and cooperative Heart: regular rate and rhythm, S1, S2 normal, no murmur, click, rub or gallop Lungs: clear to auscultation bilaterally  Lab Results:  Recent Labs  04/08/16 0345 04/09/16 0414  WBC 3.5* 3.8*  HGB 11.0* 10.4*  HCT 35.3* 32.5*  PLT 223 215   BMET:  Recent Labs  04/07/16 1105 04/08/16 0345  NA 137 136  K 3.7 4.2  CL 108 107  CO2 21* 21*  GLUCOSE 248* 280*  BUN 36* 29*  CREATININE 1.85* 1.85*  CALCIUM 9.2 9.2    PT/INR:  Recent Labs  04/08/16 0348  LABPROT 13.0  INR 0.98   ABG No results found for: PHART, HCO3, TCO2, ACIDBASEDEF, O2SAT CBG (last 3)   Recent Labs  04/09/16 0042 04/09/16 0852 04/09/16 1312  GLUCAP 364* 159* 164*    Assessment/Plan:  Severe multi-vessel coronary disease s/p NSTEMI. I have reviewed her echo and LV function appears normal, no significant MR, AS or AI. Official reading is pending. Plan CABG on Friday afternoon if platelet inhibition low enough on Thursday. She has no further questions today.   LOS: 2 days    Gaye Pollack 04/09/2016

## 2016-04-09 NOTE — Progress Notes (Signed)
Blood sugar 69, dinner tray offered. Recheck after meal 184.

## 2016-04-09 NOTE — Progress Notes (Signed)
CBG 395, Cards Fellow paged. No new orders given at this time.

## 2016-04-10 ENCOUNTER — Inpatient Hospital Stay (HOSPITAL_COMMUNITY): Payer: Medicaid Other

## 2016-04-10 DIAGNOSIS — Z01810 Encounter for preprocedural cardiovascular examination: Secondary | ICD-10-CM

## 2016-04-10 DIAGNOSIS — E119 Type 2 diabetes mellitus without complications: Secondary | ICD-10-CM

## 2016-04-10 LAB — GLUCOSE, CAPILLARY
GLUCOSE-CAPILLARY: 202 mg/dL — AB (ref 65–99)
GLUCOSE-CAPILLARY: 151 mg/dL — AB (ref 65–99)
Glucose-Capillary: 200 mg/dL — ABNORMAL HIGH (ref 65–99)
Glucose-Capillary: 57 mg/dL — ABNORMAL LOW (ref 65–99)
Glucose-Capillary: 97 mg/dL (ref 65–99)
Glucose-Capillary: 196 mg/dL — ABNORMAL HIGH (ref 65–99)

## 2016-04-10 LAB — SPIROMETRY WITH GRAPH
FEF 25-75 POST: 2.01 L/s
FEF 25-75 PRE: 1.09 L/s
FEF2575-%Change-Post: 84 %
FEF2575-%PRED-POST: 97 %
FEF2575-%Pred-Pre: 52 %
FEV1-%Change-Post: 15 %
FEV1-%PRED-POST: 82 %
FEV1-%PRED-PRE: 71 %
FEV1-POST: 1.73 L
FEV1-Pre: 1.49 L
FEV1FVC-%Change-Post: 0 %
FEV1FVC-%PRED-PRE: 94 %
FEV6-%CHANGE-POST: 16 %
FEV6-%PRED-POST: 90 %
FEV6-%Pred-Pre: 77 %
FEV6-Post: 2.32 L
FEV6-Pre: 1.99 L
FEV6FVC-%CHANGE-POST: 0 %
FEV6FVC-%Pred-Post: 102 %
FEV6FVC-%Pred-Pre: 102 %
FVC-%Change-Post: 16 %
FVC-%Pred-Post: 87 %
FVC-%Pred-Pre: 75 %
FVC-Post: 2.33 L
FVC-Pre: 2 L
POST FEV1/FVC RATIO: 74 %
PRE FEV1/FVC RATIO: 75 %
Post FEV6/FVC ratio: 99 %
Pre FEV6/FVC Ratio: 99 %

## 2016-04-10 LAB — CBC
HEMATOCRIT: 30.7 % — AB (ref 36.0–46.0)
Hemoglobin: 9.9 g/dL — ABNORMAL LOW (ref 12.0–15.0)
MCH: 27.6 pg (ref 26.0–34.0)
MCHC: 32.2 g/dL (ref 30.0–36.0)
MCV: 85.5 fL (ref 78.0–100.0)
PLATELETS: 202 10*3/uL (ref 150–400)
RBC: 3.59 MIL/uL — ABNORMAL LOW (ref 3.87–5.11)
RDW: 13.6 % (ref 11.5–15.5)
WBC: 4.4 10*3/uL (ref 4.0–10.5)

## 2016-04-10 LAB — HEPARIN LEVEL (UNFRACTIONATED): Heparin Unfractionated: 0.64 IU/mL (ref 0.30–0.70)

## 2016-04-10 MED ORDER — INSULIN ASPART 100 UNIT/ML ~~LOC~~ SOLN
0.0000 [IU] | Freq: Every day | SUBCUTANEOUS | Status: DC
  Administered 2016-04-11: 4 [IU] via SUBCUTANEOUS

## 2016-04-10 MED ORDER — INSULIN ASPART 100 UNIT/ML ~~LOC~~ SOLN
15.0000 [IU] | Freq: Three times a day (TID) | SUBCUTANEOUS | Status: DC
  Administered 2016-04-10 – 2016-04-11 (×3): 15 [IU] via SUBCUTANEOUS

## 2016-04-10 MED ORDER — INSULIN DETEMIR 100 UNIT/ML ~~LOC~~ SOLN
15.0000 [IU] | Freq: Every day | SUBCUTANEOUS | Status: DC
  Administered 2016-04-10 – 2016-04-11 (×2): 15 [IU] via SUBCUTANEOUS
  Filled 2016-04-10 (×3): qty 0.15

## 2016-04-10 MED ORDER — INSULIN ASPART 100 UNIT/ML ~~LOC~~ SOLN
15.0000 [IU] | Freq: Once | SUBCUTANEOUS | Status: AC
  Administered 2016-04-10: 15 [IU] via SUBCUTANEOUS

## 2016-04-10 MED ORDER — ALBUTEROL SULFATE (2.5 MG/3ML) 0.083% IN NEBU
2.5000 mg | INHALATION_SOLUTION | Freq: Once | RESPIRATORY_TRACT | Status: AC
  Administered 2016-04-10: 2.5 mg via RESPIRATORY_TRACT

## 2016-04-10 MED ORDER — INSULIN ASPART 100 UNIT/ML ~~LOC~~ SOLN
0.0000 [IU] | Freq: Three times a day (TID) | SUBCUTANEOUS | Status: DC
  Administered 2016-04-10: 3 [IU] via SUBCUTANEOUS
  Administered 2016-04-11: 2 [IU] via SUBCUTANEOUS

## 2016-04-10 NOTE — Progress Notes (Signed)
[place] for Heparin Indication: chest pain/ACS  Allergies  Allergen Reactions  . Iron Hives and Other (See Comments)    Elevated blood sugar, glaucoma worsened,   . Promethazine Other (See Comments)    Pt states her muscles jerk  . Wellbutrin [Bupropion Hcl] Nausea And Vomiting    Patient Measurements: Height: 5\' 3"  (160 cm) Weight: 200 lb 9.9 oz (91 kg) IBW/kg (Calculated) : 52.4 Heparin Dosing Weight: 74.4 kg  Vital Signs: Temp: 98.2 F (36.8 C) (11/01 0852) Temp Source: Oral (11/01 0852) BP: 130/90 (11/01 0852) Pulse Rate: 63 (11/01 0852)  Labs:  Recent Labs  04/07/16 1105 04/07/16 1713  04/07/16 2159 04/08/16 0345 04/08/16 0348 04/09/16 0414  04/09/16 1146 04/09/16 2030 04/10/16 0300  HGB 10.7*  --   --   --  11.0*  --  10.4*  --   --   --  9.9*  HCT 33.9*  --   --   --  35.3*  --  32.5*  --   --   --  30.7*  PLT 222  --   --   --  223  --  215  --   --   --  202  LABPROT 13.4  --   --   --   --  13.0  --   --   --   --   --   INR 1.02  --   --   --   --  0.98  --   --   --   --   --   HEPARINUNFRC  --   --   < >  --  0.50  --   --   < > 0.65 0.94* 0.64  CREATININE 1.85*  --   --   --  1.85*  --   --   --   --   --   --   TROPONINI 0.73* 3.72*  --  3.32* 3.71*  --   --   --   --   --   --   < > = values in this interval not displayed.  Estimated Creatinine Clearance: 34.6 mL/min (by C-G formula based on SCr of 1.85 mg/dL (H)).   . D-10-0.45% Sodium Chloride with KCL 40 meq/L 1000 ml Stopped (04/08/16 1829)  . heparin 1,050 Units/hr (04/09/16 2117)     Assessment:  60 y.o. female with CAD awaiting CABG after plavix washout, continuing on heparin. Not on anticoagulation PTA. CBC stable, no bleed documented.  Heparin level now therapeutic at 0.64 on 1200 units/hr. No issues with infusion. Hgb with slight decline, but no bleeding noted.  Goal of Therapy:  Heparin level 0.3-0.7 units/ml Monitor platelets by  anticoagulation protocol: Yes   Plan:  Continue IV heparin at current rate. Daily heparin level and CBC CABG scheduled for 11/3  Uvaldo Rising, BCPS  Clinical Pharmacist Pager [phone]  04/10/2016 9:16 AM

## 2016-04-10 NOTE — Progress Notes (Signed)
Pre-op Cardiac Surgery  Carotid Findings:  Bilateral: No significant (1-39%) ICA stenosis. Antegrade vertebral flow.    Upper Extremity Right Left  Brachial Pressures 137 143  Radial Waveforms Tri Tri  Ulnar Waveforms Tri Tri  Palmar Arch (Allen's Test) Normal with radial compression, decreases >50% with ulnar compression Normal       Lower  Extremity Right Left  Dorsalis Pedis 246, Bi 219, Bi  Posterior Tibial 236, Bi 186, Bi  Toe pressures 106 106  Toe Brachial Indices 0.74 0.74  Ankle/Brachial Indices Non compressible Non compressible    Findings:  Non compressible ABI, however TBI is within normal limits.   04/10/2016  Lita Mains, RDMS, RVT Entered by Landry Mellow RDMS, RVT

## 2016-04-10 NOTE — Progress Notes (Signed)
RN called to room at 1400. Pt using bathroom and noticed her R groin was bleeding. RN got patient back to bed and held pressure for 30 minutes. Palpable hematoma noted >3cm, or a level 2 site. Pt's vitals stable throughout. After pressure applied, hematoma greatly reduced in size to <1cm. Pressure dressing applied and pt instructed on covering site when laughing, coughing, etc. Will continue to monitor.

## 2016-04-10 NOTE — Progress Notes (Signed)
CRITICAL VALUE ALERT  Critical value received:  CBG 57  Date of notification:  04/10/2016  Time of notification:  5831  Critical value read back:Yes.    Nurse who received alert:  Megan Bailey,RN   Pt given Coke, pt refused OJ. Repeat CBG 97

## 2016-04-10 NOTE — Progress Notes (Signed)
Patient Name: April Pena Date of Encounter: 04/10/2016  Primary Cardiologist: (new-Hochrein)  Hospital Problem List     Active Problems:   Troponin level elevated   NSTEMI (non-ST elevated myocardial infarction) [facility])    Subjective   April Pena is feeling well. She had chest pain and shortness of breath with walking and washing.    Inpatient Medications    Scheduled Meds: . amLODipine  10 mg Oral Daily  . aspirin EC  81 mg Oral Daily  . atorvastatin  40 mg Oral QHS  . carvedilol  12.5 mg Oral BID WC  . dorzolamide-timolol  1 drop Right Eye BID  . gabapentin  300 mg Oral Daily  . insulin aspart  0-15 Units Subcutaneous TID WC  . insulin aspart  0-5 Units Subcutaneous QHS  . insulin aspart  15 Units Subcutaneous TID WC  . insulin detemir  15 Units Subcutaneous QHS  . isosorbide mononitrate  60 mg Oral Daily  . magnesium oxide  400 mg Oral BID  . mycophenolate  360 mg Oral BID  . pantoprazole  40 mg Oral Daily  . predniSONE  5 mg Oral Daily  . sodium bicarbonate  650 mg Oral BID  . sodium chloride flush  3 mL Intravenous Q12H  . sulfamethoxazole-trimethoprim  1 tablet Oral Q M,W,F  . tacrolimus  2 mg Oral QHS  . tacrolimus  3 mg Oral Daily   Continuous Infusions: . D-10-0.45% Sodium Chloride with KCL 40 meq/L 1000 ml Stopped (04/08/16 1829)  . heparin 1,050 Units/hr (04/09/16 2117)   PRN Meds: sodium chloride, acetaminophen, nitroGLYCERIN, ondansetron (ZOFRAN) IV, oxyCODONE-acetaminophen, prednisoLONE acetate, sodium chloride flush   Vital Signs    Vitals:   04/09/16 2000 04/09/16 2300 04/10/16 0000 04/10/16 0400  BP:  137/71  132/77  Pulse: 70 72 66 (!) 59  Resp: (!) 33 19 16 11   Temp:  98.5 F (36.9 C)  98 F (36.7 C)  TempSrc:  Oral  Oral  SpO2: 94% 100% 98% 100%  Weight:      Height:        Intake/Output Summary (Last 24 hours) at 04/10/16 0841 Last data filed at 04/10/16 0700  Gross per 24 hour  Intake           285.43 ml  Output               400 ml  Net          -114.57 ml   Filed Weights   04/07/16 1054 04/07/16 1408  Weight: 88.5 kg (195 lb) 91 kg (200 lb 9.9 oz)    Physical Exam   GEN: Well nourished, well developed, in no acute distress.  HEENT: Grossly normal.  Neck: Supple, no JVD, carotid bruits, or masses. Cardiac: RRR, no murmurs, rubs, or gallops. No clubbing, cyanosis, edema.  Radials/DP/PT 2+ and equal bilaterally.  Respiratory:  Respirations regular and unlabored, clear to auscultation bilaterally. GI: Soft, nontender, nondistended, BS + x 4. MS: no deformity or atrophy. Skin: warm and dry, no rash. Neuro:  Strength and sensation are intact. Psych: AAOx3.  Normal affect.  Labs    CBC  Recent Labs  04/07/16 1105  04/09/16 0414 04/10/16 0300  WBC 4.8  < > 3.8* 4.4  NEUTROABS 3.4  --   --   --   HGB 10.7*  < > 10.4* 9.9*  HCT 33.9*  < > 32.5* 30.7*  MCV 88.7  < > 87.4 85.5  PLT 222  < >  215 202  < > = values in this interval not displayed. Basic Metabolic Panel  Recent Labs  04/07/16 1105 04/08/16 0345  NA 137 136  K 3.7 4.2  CL 108 107  CO2 21* 21*  GLUCOSE 248* 280*  BUN 36* 29*  CREATININE 1.85* 1.85*  CALCIUM 9.2 9.2   Liver Function Tests No results for input(s): AST, ALT, ALKPHOS, BILITOT, PROT, ALBUMIN in the last 72 hours. No results for input(s): LIPASE, AMYLASE in the last 72 hours. Cardiac Enzymes  Recent Labs  04/07/16 1713 04/07/16 2159 04/08/16 0345  TROPONINI 3.72* 3.32* 3.71*   BNP Invalid input(s): POCBNP D-Dimer No results for input(s): DDIMER in the last 72 hours. Hemoglobin A1C No results for input(s): HGBA1C in the last 72 hours. Fasting Lipid Panel  Recent Labs  04/08/16 0345  CHOL 149  HDL 61  LDLCALC 74  TRIG 71  CHOLHDL 2.4   Thyroid Function Tests No results for input(s): TSH, T4TOTAL, T3FREE, THYROIDAB in the last 72 hours.  Invalid input(s): FREET3  Telemetry    Sinus bradycardia - Personally Reviewed  ECG    Sinus  bradycardia. Rate 51 bpm. Lateral T-wave inversions- Personally Reviewed  Radiology    No results found.  Cardiac Studies   LHC 04/08/16:  2nd Mrg lesion, 99 %stenosed.  Dist Cx-2 lesion, 100 %stenosed.  Dist Cx-1 lesion, 30 %stenosed.  3rd Mrg lesion, 99 %stenosed.  Prox RCA to Mid RCA lesion, 80 %stenosed.  Mid RCA to Dist RCA lesion, 99 %stenosed.  Ost 1st Diag to 1st Diag lesion, 99 %stenosed.  Mid LAD lesion, 40 %stenosed.  Dist LAD-1 lesion, 40 %stenosed.  Dist LAD-2 lesion, 40 %stenosed.   1. Severe double vessel CAD 2. The RCA is diffusely diseased in the proximal, mid and distal segments. This vessel is moderate in caliber but heavily calcified. This vessel is not a favorable target for PCI. (Of note, PCI of the RCA attempted at [facility] in 2016 and unsuccessful).  3. The Circumflex is a large caliber vessel with high grade stenosis in a moderate caliber obtuse marginal branch and total occlusion of the mid AV groove segment. This entire vessel is heavily calcified and not favorable for PCI.  4. Moderate non-obstructive heavily calcified disease in the proximal, mid and distal LAD.  5. Chronic kidney disease, s/p renal transplant.    Echo pending  Patient Profile     April Pena is a 18F with Hypertensive heart disease, hyperlipidemia, diabetes, status post renal transplant with residual chronic kidney disease here with NSTEMI.  Assessment & Plan    # NSTEMI: Troponin elevated to 3.71.  She was found to have severe two vessel CAD  (RCA, OM2, OM3, LCx) with non-obstructive LAD disease.  Given the complexity of the lesions and the fact that the RCA was attempted unsuccessfully at [facility], CABG was felt to be her best option.  Plan for CABG Friday with Dr. Cyndia Bent.  Continue aspirin, heparin, Imdur, metoprolol, and atorvastatin.  She should be on a P2Y12 agent for one year post CABG.  # Hypertensive heart disease: BP better controlled since switching  metoprolol to carvedilol.  Continue amlodipine.  She is not on an ACE inhibitor or ARB due to renal dysfunction.   # Hyperlipidemia:  LDL 74 this admission.  Increase atorvastatin to 40 mg to achieve LDL less than 70.  # Diabetes: Blood glucose has been poorly-controlled. We will increase her aspart to 15 units and her Levemir to 15  as well. We have also added a sliding scale.  Signed, Skeet Latch, MD  04/10/2016, 8:41 AM

## 2016-04-11 ENCOUNTER — Encounter (HOSPITAL_COMMUNITY): Payer: Self-pay | Admitting: Cardiovascular Disease

## 2016-04-11 DIAGNOSIS — E669 Obesity, unspecified: Secondary | ICD-10-CM

## 2016-04-11 DIAGNOSIS — I10 Essential (primary) hypertension: Secondary | ICD-10-CM | POA: Diagnosis present

## 2016-04-11 DIAGNOSIS — E785 Hyperlipidemia, unspecified: Secondary | ICD-10-CM

## 2016-04-11 DIAGNOSIS — E1169 Type 2 diabetes mellitus with other specified complication: Secondary | ICD-10-CM

## 2016-04-11 DIAGNOSIS — I2510 Atherosclerotic heart disease of native coronary artery without angina pectoris: Secondary | ICD-10-CM

## 2016-04-11 DIAGNOSIS — S301XXA Contusion of abdominal wall, initial encounter: Secondary | ICD-10-CM | POA: Diagnosis not present

## 2016-04-11 DIAGNOSIS — I119 Hypertensive heart disease without heart failure: Secondary | ICD-10-CM

## 2016-04-11 HISTORY — DX: Hyperlipidemia, unspecified: E78.5

## 2016-04-11 HISTORY — DX: Hypertensive heart disease without heart failure: I11.9

## 2016-04-11 HISTORY — DX: Type 2 diabetes mellitus with other specified complication: E11.69

## 2016-04-11 LAB — ABO/RH: ABO/RH(D): O POS

## 2016-04-11 LAB — COMPREHENSIVE METABOLIC PANEL
ALBUMIN: 4.3 g/dL (ref 3.5–5.0)
ALT: 26 U/L (ref 14–54)
AST: 39 U/L (ref 15–41)
Alkaline Phosphatase: 82 U/L (ref 38–126)
Anion gap: 9 (ref 5–15)
BUN: 32 mg/dL — AB (ref 6–20)
CHLORIDE: 107 mmol/L (ref 101–111)
CO2: 21 mmol/L — AB (ref 22–32)
Calcium: 9.8 mg/dL (ref 8.9–10.3)
Creatinine, Ser: 2.37 mg/dL — ABNORMAL HIGH (ref 0.44–1.00)
GFR calc Af Amer: 24 mL/min — ABNORMAL LOW (ref >60)
GFR calc non Af Amer: 21 mL/min — ABNORMAL LOW (ref >60)
GLUCOSE: 163 mg/dL — AB (ref 65–99)
POTASSIUM: 5.4 mmol/L — AB (ref 3.5–5.1)
Sodium: 137 mmol/L (ref 135–145)
Total Bilirubin: 0.6 mg/dL (ref 0.3–1.2)
Total Protein: 7.5 g/dL (ref 6.5–8.1)

## 2016-04-11 LAB — BLOOD GAS, ARTERIAL
Acid-base deficit: 4 mmol/L — ABNORMAL HIGH (ref 0.0–2.0)
BICARBONATE: 20.6 mmol/L (ref 20.0–28.0)
Drawn by: 365271
FIO2: 21
O2 Saturation: 96.1 %
PCO2 ART: 37.7 mmHg (ref 32.0–48.0)
PH ART: 7.355 (ref 7.350–7.450)
PO2 ART: 86.6 mmHg (ref 83.0–108.0)
Patient temperature: 98.6

## 2016-04-11 LAB — URINALYSIS, ROUTINE W REFLEX MICROSCOPIC
Bilirubin Urine: NEGATIVE
GLUCOSE, UA: NEGATIVE mg/dL
Hgb urine dipstick: NEGATIVE
Ketones, ur: NEGATIVE mg/dL
LEUKOCYTES UA: NEGATIVE
Nitrite: NEGATIVE
PH: 6 (ref 5.0–8.0)
PROTEIN: NEGATIVE mg/dL
SPECIFIC GRAVITY, URINE: 1.015 (ref 1.005–1.030)

## 2016-04-11 LAB — CBC
HEMATOCRIT: 30.8 % — AB (ref 36.0–46.0)
HEMOGLOBIN: 9.8 g/dL — AB (ref 12.0–15.0)
MCH: 27.8 pg (ref 26.0–34.0)
MCHC: 31.8 g/dL (ref 30.0–36.0)
MCV: 87.3 fL (ref 78.0–100.0)
Platelets: 210 10*3/uL (ref 150–400)
RBC: 3.53 MIL/uL — AB (ref 3.87–5.11)
RDW: 14 % (ref 11.5–15.5)
WBC: 4.2 10*3/uL (ref 4.0–10.5)

## 2016-04-11 LAB — GLUCOSE, CAPILLARY
GLUCOSE-CAPILLARY: 83 mg/dL (ref 65–99)
GLUCOSE-CAPILLARY: 85 mg/dL (ref 65–99)
GLUCOSE-CAPILLARY: 311 mg/dL — AB (ref 65–99)
Glucose-Capillary: 146 mg/dL — ABNORMAL HIGH (ref 65–99)
Glucose-Capillary: 110 mg/dL — ABNORMAL HIGH (ref 65–99)
Glucose-Capillary: 62 mg/dL — ABNORMAL LOW (ref 65–99)
Glucose-Capillary: 268 mg/dL — ABNORMAL HIGH (ref 65–99)

## 2016-04-11 LAB — HEPARIN LEVEL (UNFRACTIONATED): HEPARIN UNFRACTIONATED: 0.64 [IU]/mL (ref 0.30–0.70)

## 2016-04-11 LAB — SURGICAL PCR SCREEN
MRSA, PCR: NEGATIVE
Staphylococcus aureus: NEGATIVE

## 2016-04-11 LAB — APTT: APTT: 112 s — AB (ref 24–36)

## 2016-04-11 LAB — PLATELET INHIBITION P2Y12: Platelet Function  P2Y12: 307 [PRU] (ref 194–418)

## 2016-04-11 MED ORDER — SODIUM CHLORIDE 0.9 % IV SOLN
INTRAVENOUS | Status: AC
  Administered 2016-04-12: 2.4 [IU]/h via INTRAVENOUS
  Filled 2016-04-11: qty 2.5

## 2016-04-11 MED ORDER — DEXTROSE 5 % IV SOLN
1.5000 g | INTRAVENOUS | Status: AC
  Administered 2016-04-12: .75 g via INTRAVENOUS
  Administered 2016-04-12: 1.5 g via INTRAVENOUS
  Filled 2016-04-11 (×2): qty 1.5

## 2016-04-11 MED ORDER — DEXTROSE 5 % IV SOLN
0.0000 ug/min | INTRAVENOUS | Status: DC
  Filled 2016-04-11: qty 4

## 2016-04-11 MED ORDER — TRANEXAMIC ACID (OHS) BOLUS VIA INFUSION
15.0000 mg/kg | INTRAVENOUS | Status: AC
  Administered 2016-04-12: 1365 mg via INTRAVENOUS

## 2016-04-11 MED ORDER — POTASSIUM CHLORIDE 2 MEQ/ML IV SOLN
80.0000 meq | INTRAVENOUS | Status: DC
  Filled 2016-04-11: qty 40

## 2016-04-11 MED ORDER — VANCOMYCIN HCL 10 G IV SOLR
1250.0000 mg | INTRAVENOUS | Status: AC
  Administered 2016-04-12: 1250 mg via INTRAVENOUS
  Filled 2016-04-11 (×2): qty 1250

## 2016-04-11 MED ORDER — DIAZEPAM 5 MG PO TABS
5.0000 mg | ORAL_TABLET | Freq: Once | ORAL | Status: AC
  Administered 2016-04-12: 5 mg via ORAL
  Filled 2016-04-11: qty 1

## 2016-04-11 MED ORDER — INSULIN ASPART 100 UNIT/ML ~~LOC~~ SOLN
10.0000 [IU] | Freq: Three times a day (TID) | SUBCUTANEOUS | Status: DC
  Administered 2016-04-11: 10 [IU] via SUBCUTANEOUS

## 2016-04-11 MED ORDER — CHLORHEXIDINE GLUCONATE 0.12 % MT SOLN
15.0000 mL | Freq: Once | OROMUCOSAL | Status: AC
  Administered 2016-04-12: 15 mL via OROMUCOSAL
  Filled 2016-04-11: qty 15

## 2016-04-11 MED ORDER — CHLORHEXIDINE GLUCONATE CLOTH 2 % EX PADS
6.0000 | MEDICATED_PAD | Freq: Once | CUTANEOUS | Status: AC
  Administered 2016-04-11: 6 via TOPICAL

## 2016-04-11 MED ORDER — PHENYLEPHRINE HCL 10 MG/ML IJ SOLN
30.0000 ug/min | INTRAVENOUS | Status: AC
  Administered 2016-04-12: 20 ug/min via INTRAVENOUS
  Filled 2016-04-11: qty 2

## 2016-04-11 MED ORDER — TRANEXAMIC ACID (OHS) PUMP PRIME SOLUTION
2.0000 mg/kg | INTRAVENOUS | Status: DC
Start: 2016-04-12 — End: 2016-04-12
  Filled 2016-04-11: qty 1.82

## 2016-04-11 MED ORDER — NITROGLYCERIN IN D5W 200-5 MCG/ML-% IV SOLN
2.0000 ug/min | INTRAVENOUS | Status: AC
  Administered 2016-04-12: 1.5 ug/min via INTRAVENOUS
  Filled 2016-04-11: qty 250

## 2016-04-11 MED ORDER — BISACODYL 5 MG PO TBEC: 5.0000 mg | DELAYED_RELEASE_TABLET | Freq: Once | ORAL | Status: DC

## 2016-04-11 MED ORDER — DOPAMINE-DEXTROSE 3.2-5 MG/ML-% IV SOLN
0.0000 ug/kg/min | INTRAVENOUS | Status: DC
  Filled 2016-04-11: qty 250

## 2016-04-11 MED ORDER — MAGNESIUM SULFATE 50 % IJ SOLN
40.0000 meq | INTRAMUSCULAR | Status: DC
  Filled 2016-04-11: qty 10

## 2016-04-11 MED ORDER — PLASMA-LYTE 148 IV SOLN
INTRAVENOUS | Status: AC
  Administered 2016-04-12: 500 mL
  Filled 2016-04-11: qty 2.5

## 2016-04-11 MED ORDER — TRANEXAMIC ACID 1000 MG/10ML IV SOLN
1.5000 mg/kg/h | INTRAVENOUS | Status: AC
  Administered 2016-04-12: 1.5 mg/kg/h via INTRAVENOUS
  Filled 2016-04-11: qty 25

## 2016-04-11 MED ORDER — METOPROLOL TARTRATE 12.5 MG HALF TABLET
12.5000 mg | ORAL_TABLET | Freq: Once | ORAL | Status: AC
  Administered 2016-04-12: 12.5 mg via ORAL
  Filled 2016-04-11: qty 1

## 2016-04-11 MED ORDER — TEMAZEPAM 15 MG PO CAPS
15.0000 mg | ORAL_CAPSULE | Freq: Once | ORAL | Status: AC | PRN
  Administered 2016-04-11: 15 mg via ORAL
  Filled 2016-04-11: qty 1

## 2016-04-11 MED ORDER — SIMETHICONE 80 MG PO CHEW
80.0000 mg | CHEWABLE_TABLET | Freq: Once | ORAL | Status: AC
  Administered 2016-04-11: 80 mg via ORAL
  Filled 2016-04-11: qty 1

## 2016-04-11 MED ORDER — DEXMEDETOMIDINE HCL IN NACL 400 MCG/100ML IV SOLN
0.1000 ug/kg/h | INTRAVENOUS | Status: AC
  Administered 2016-04-12: .2 ug/kg/h via INTRAVENOUS
  Filled 2016-04-11: qty 100

## 2016-04-11 MED ORDER — SODIUM CHLORIDE 0.9 % IV SOLN
INTRAVENOUS | Status: DC
  Filled 2016-04-11: qty 30

## 2016-04-11 MED ORDER — DEXTROSE 5 % IV SOLN
750.0000 mg | INTRAVENOUS | Status: DC
  Filled 2016-04-11: qty 750

## 2016-04-11 NOTE — Progress Notes (Signed)
Patient Name: April Pena Date of Encounter: 04/11/2016  Primary Cardiologist: (new-Hochrein)  Hospital Problem List     Active Problems:   Troponin level elevated   NSTEMI (non-ST elevated myocardial infarction) [facility])    Subjective   April Pena is feeling well. She notes oozing from her IV sites.  She also has a right groin hematoma that started bleeding yesterday. They held pressure and it is improving this morning.    Inpatient Medications    Scheduled Meds: . amLODipine  10 mg Oral Daily  . aspirin EC  81 mg Oral Daily  . atorvastatin  40 mg Oral QHS  . carvedilol  12.5 mg Oral BID WC  . dorzolamide-timolol  1 drop Right Eye BID  . gabapentin  300 mg Oral Daily  . insulin aspart  0-15 Units Subcutaneous TID WC  . insulin aspart  0-5 Units Subcutaneous QHS  . insulin aspart  15 Units Subcutaneous TID WC  . insulin detemir  15 Units Subcutaneous QHS  . isosorbide mononitrate  60 mg Oral Daily  . magnesium oxide  400 mg Oral BID  . mycophenolate  360 mg Oral BID  . pantoprazole  40 mg Oral Daily  . predniSONE  5 mg Oral Daily  . sodium bicarbonate  650 mg Oral BID  . sodium chloride flush  3 mL Intravenous Q12H  . sulfamethoxazole-trimethoprim  1 tablet Oral Q M,W,F  . tacrolimus  2 mg Oral QHS  . tacrolimus  3 mg Oral Daily   Continuous Infusions: . D-10-0.45% Sodium Chloride with KCL 40 meq/L 1000 ml Stopped (04/08/16 1829)  . heparin 1,050 Units/hr (04/11/16 0700)   PRN Meds: sodium chloride, acetaminophen, nitroGLYCERIN, ondansetron (ZOFRAN) IV, oxyCODONE-acetaminophen, prednisoLONE acetate, sodium chloride flush   Vital Signs    Vitals:   04/10/16 1946 04/10/16 2343 04/11/16 0300 04/11/16 0745  BP: 130/71 139/74 (!) 111/40 (!) 151/79  Pulse:    63  Resp: 18 18 20 16   Temp: 98.1 F (36.7 C) 98.6 F (37 C) 98.3 F (36.8 C) 98.2 F (36.8 C)  TempSrc: Oral Oral Oral Oral  SpO2: 97%  97% 100%  Weight:      Height:        Intake/Output Summary  (Last 24 hours) at 04/11/16 1014 Last data filed at 04/11/16 0900  Gross per 24 hour  Intake              930 ml  Output             1900 ml  Net             -970 ml   Filed Weights   04/07/16 1054 04/07/16 1408  Weight: 88.5 kg (195 lb) 91 kg (200 lb 9.9 oz)    Physical Exam   GEN: Well nourished, well developed, in no acute distress.  HEENT: Grossly normal.  Neck: Supple, no JVD, carotid bruits, or masses. Cardiac: RRR, no murmurs, rubs, or gallops. No clubbing, cyanosis, edema.  Radials/DP/PT 2+ and equal bilaterally.  Respiratory:  Respirations regular and unlabored, clear to auscultation bilaterally. GI: Soft, nontender, nondistended, BS + x 4. EXT: Golf ball sized R groin hematoma.  No bruit MS: no deformity or atrophy. Skin: warm and dry, no rash. Neuro:  Strength and sensation are intact. Psych: AAOx3.  Normal affect.  Labs    CBC  Recent Labs  04/10/16 0300 04/11/16 0340  WBC 4.4 4.2  HGB 9.9* 9.8*  HCT 30.7* 30.8*  MCV  85.5 87.3  PLT 202 035   Basic Metabolic Panel No results for input(s): NA, K, CL, CO2, GLUCOSE, BUN, CREATININE, CALCIUM, MG, PHOS in the last 72 hours. Liver Function Tests No results for input(s): AST, ALT, ALKPHOS, BILITOT, PROT, ALBUMIN in the last 72 hours. No results for input(s): LIPASE, AMYLASE in the last 72 hours. Cardiac Enzymes No results for input(s): CKTOTAL, CKMB, CKMBINDEX, TROPONINI in the last 72 hours. BNP Invalid input(s): POCBNP D-Dimer No results for input(s): DDIMER in the last 72 hours. Hemoglobin A1C No results for input(s): HGBA1C in the last 72 hours. Fasting Lipid Panel No results for input(s): CHOL, HDL, LDLCALC, TRIG, CHOLHDL, LDLDIRECT in the last 72 hours. Thyroid Function Tests No results for input(s): TSH, T4TOTAL, T3FREE, THYROIDAB in the last 72 hours.  Invalid input(s): FREET3  Telemetry    Sinus bradycardia - Personally Reviewed  ECG    Sinus bradycardia. Rate 51 bpm. Lateral T-wave  inversions- Personally Reviewed  Radiology    No results found.  Cardiac Studies   LHC 04/08/16:  2nd Mrg lesion, 99 %stenosed.  Dist Cx-2 lesion, 100 %stenosed.  Dist Cx-1 lesion, 30 %stenosed.  3rd Mrg lesion, 99 %stenosed.  Prox RCA to Mid RCA lesion, 80 %stenosed.  Mid RCA to Dist RCA lesion, 99 %stenosed.  Ost 1st Diag to 1st Diag lesion, 99 %stenosed.  Mid LAD lesion, 40 %stenosed.  Dist LAD-1 lesion, 40 %stenosed.  Dist LAD-2 lesion, 40 %stenosed.   1. Severe double vessel CAD 2. The RCA is diffusely diseased in the proximal, mid and distal segments. This vessel is moderate in caliber but heavily calcified. This vessel is not a favorable target for PCI. (Of note, PCI of the RCA attempted at [facility] in 2016 and unsuccessful).  3. The Circumflex is a large caliber vessel with high grade stenosis in a moderate caliber obtuse marginal branch and total occlusion of the mid AV groove segment. This entire vessel is heavily calcified and not favorable for PCI.  4. Moderate non-obstructive heavily calcified disease in the proximal, mid and distal LAD.  5. Chronic kidney disease, s/p renal transplant.    Echo 04/09/16: Study Conclusions  - Left ventricle: The cavity size was normal. Wall thickness was   increased in a pattern of mild LVH. Moderate septal hypertrophy.   Systolic function was vigorous. The estimated ejection fraction   was in the range of 65% to 70%. Wall motion was normal; there   were no regional wall motion abnormalities. Doppler parameters   are consistent with abnormal left ventricular relaxation (grade 1   diastolic dysfunction). Doppler parameters are consistent with   high ventricular filling pressure. - Mitral valve: Calcified annulus.  Patient Profile     April Pena is a 5F with Hypertensive heart disease, hyperlipidemia, diabetes, status post renal transplant with residual chronic kidney disease here with  NSTEMI.  Assessment & Plan    # NSTEMI: Troponin elevated to 3.71.  She was found to have severe two vessel CAD  (RCA, OM2, OM3, LCx) with non-obstructive LAD disease.  Given the complexity of the lesions and the fact that the RCA was attempted unsuccessfully at [facility], CABG was felt to be her best option.  Plan for CABG tomorrow with Dr. Cyndia Bent.  Continue aspirin, heparin, Imdur, metoprolol, and atorvastatin.  She should be on a P2Y12 agent for one year post CABG.  # Hematoma: April Pena has a) hematoma that is improving. There is no bruit and she has good  peripheral pulses. The hematoma has been outlined. If it is enlarging in the next couple hours we will obtain a groin ultrasound.  H/H has been stable.   # Hypertensive heart disease: BP better controlled since switching metoprolol to carvedilol.  Continue amlodipine.  She is not on an ACE inhibitor or ARB due to renal dysfunction.   # Hyperlipidemia:  LDL 74 this admission.  Increased atorvastatin to 40 mg to achieve LDL less than 70.  # Diabetes: Blood glucose is much better since increasing her insulin.  Signed, Skeet Latch, MD  04/11/2016, 10:14 AM

## 2016-04-11 NOTE — Progress Notes (Signed)
Pt stated she felt like her blood sugar was getting low. Upon assessment her BS was 62. PA notified. Will continue to monitor.

## 2016-04-11 NOTE — Progress Notes (Signed)
CARDIAC REHAB PHASE I   PRE:  Rate/Rhythm: 65 SR    BP: sitting 139/67, after 132/70    SaO2: 100 RA  MODE:  Ambulation: 140 ft   POST:  Rate/Rhythm: 72 SR    BP: sitting 133/73     SaO2: 100 RA  Pt groin stable however c/o lightheadedness and "funny feeling" in her head upon standing and walking to BR. Pt was unsteady. Return to recliner and took BP, which was stable. Pt ambulated short distance in hall with gait belt and RW. She continued to have this funny feeling. Also sts she feels like she is leaning right.  Also her right arm was hurting and 'jumping". She stated it was doing this last night. Pt did not feel better until a few minutes after sitting in recliner. Notified RN. [phone]  [place], ACSM 04/11/2016 12:08 PM

## 2016-04-11 NOTE — Care Management Note (Signed)
Case Management Note  Patient Details  Name: April Pena MRN: [id number] Date of Birth: Jan 11, 1956  Subjective/Objective:      NSTEMI, CABG planned 04/12/2016           Action/Plan: Discharge Planning: NCM spoke to pt and she lives at home alone. She has RW at home. Pt is hoping she can go to IP rehab after surgery. Waiting final recommendations post surgery.     Expected Discharge Date:                 Expected Discharge Plan:  [place]  In-House Referral:  Clinical Social Work  Discharge planning Services  CM Consult  Post Acute Care Choice:  NA Choice offered to:  NA  DME Arranged:  N/A DME Agency:  NA  HH Arranged:  NA HH Agency:  NA  Status of Service:  In process, will continue to follow  If discussed at Long Length of Stay Meetings, dates discussed:    Additional Comments:  Erenest Rasher, RN 04/11/2016, 12:28 PM

## 2016-04-11 NOTE — Progress Notes (Signed)
3 Days Post-Op Procedure(s) (LRB): Left Heart Cath and Coronary Angiography (N/A) Subjective: No chest pain today  Objective: Vital signs in last 24 hours: Temp:  [98 F (36.7 C)-98.6 F (37 C)] (P) 98 F (36.7 C) (11/02 1606) Pulse Rate:  [63-73] (P) 73 (11/02 1606) Cardiac Rhythm: Normal sinus rhythm (11/02 1549) Resp:  [16-20] (P) 20 (11/02 1606) BP: (111-151)/(40-79) (P) 116/75 (11/02 1606) SpO2:  [97 %-100 %] 100 % (11/02 1127)  Hemodynamic parameters for last 24 hours:    Intake/Output from previous day: 11/01 0701 - 11/02 0700 In: 841.5 [P.O.:600; I.V.:241.5] Out: 2000 [Urine:2000] Intake/Output this shift: Total I/O In: 674.5 [P.O.:580; I.V.:94.5] Out: 675 [Urine:675]    Lab Results:  Recent Labs  04/10/16 0300 04/11/16 0340  WBC 4.4 4.2  HGB 9.9* 9.8*  HCT 30.7* 30.8*  PLT 202 210   BMET: No results for input(s): NA, K, CL, CO2, GLUCOSE, BUN, CREATININE, CALCIUM in the last 72 hours.  PT/INR: No results for input(s): LABPROT, INR in the last 72 hours. ABG No results found for: PHART, HCO3, TCO2, ACIDBASEDEF, O2SAT CBG (last 3)   Recent Labs  04/11/16 1504 04/11/16 1544 04/11/16 1611  GLUCAP 62* 83 85    Assessment/Plan: S/P Procedure(s) (LRB): Left Heart Cath and Coronary Angiography (N/A)  Severe multi-vessel CAD. Plt inhibition 307 which should be fine for surgery Plan CABG tomorrow afternoon    LOS: 4 days    April Pena 04/11/2016

## 2016-04-11 NOTE — Progress Notes (Signed)
[place] for Heparin Indication: chest pain/ACS  Allergies  Allergen Reactions  . Iron Hives and Other (See Comments)    Elevated blood sugar, glaucoma worsened,   . Promethazine Other (See Comments)    Pt states her muscles jerk  . Wellbutrin [Bupropion Hcl] Nausea And Vomiting    Patient Measurements: Height: 5\' 3"  (160 cm) Weight: 200 lb 9.9 oz (91 kg) IBW/kg (Calculated) : 52.4 Heparin Dosing Weight: 74.4 kg  Vital Signs: Temp: 98.2 F (36.8 C) (11/02 1127) Temp Source: Oral (11/02 1127) BP: 139/67 (11/02 1127) Pulse Rate: 69 (11/02 1127)  Labs:  Recent Labs  04/09/16 0414  04/09/16 2030 04/10/16 0300 04/11/16 0340  HGB 10.4*  --   --  9.9* 9.8*  HCT 32.5*  --   --  30.7* 30.8*  PLT 215  --   --  202 210  HEPARINUNFRC  --   < > 0.94* 0.64 0.64  < > = values in this interval not displayed.  Estimated Creatinine Clearance: 34.6 mL/min (by C-G formula based on SCr of 1.85 mg/dL (H)).   . D-10-0.45% Sodium Chloride with KCL 40 meq/L 1000 ml Stopped (04/08/16 1829)  . heparin 1,050 Units/hr (04/11/16 1200)     Assessment:  60 y.o. female with CAD awaiting CABG after plavix washout - PRU 300 ok for OR in am Continued on heparin post cath. CBC stable, no bleed documented.  Heparin level  therapeutic at 0.64 on 1050 units/hr. No issues with infusion. Hgb with slight decline, but no bleeding noted.  Goal of Therapy:  Heparin level 0.3-0.7 units/ml Monitor platelets by anticoagulation protocol: Yes   Plan:  Continue IV heparin at  1050 uts/hr Daily heparin level and CBC CABG scheduled for 11/3  Bonnita Nasuti Pharm.D. CPP, BCPS Clinical Pharmacist [phone] 04/11/2016 2:40 PM

## 2016-04-12 ENCOUNTER — Inpatient Hospital Stay (HOSPITAL_COMMUNITY): Payer: Medicaid Other

## 2016-04-12 ENCOUNTER — Encounter (HOSPITAL_COMMUNITY): Payer: Self-pay | Admitting: Anesthesiology

## 2016-04-12 ENCOUNTER — Inpatient Hospital Stay (HOSPITAL_COMMUNITY): Payer: Medicaid Other | Admitting: Anesthesiology

## 2016-04-12 ENCOUNTER — Encounter (HOSPITAL_COMMUNITY)
Admission: EM | Disposition: A | Discharge status: Home / Self Care | Payer: Self-pay | Source: Home / Self Care | Attending: Surgery

## 2016-04-12 DIAGNOSIS — Z951 Presence of aortocoronary bypass graft: Secondary | ICD-10-CM

## 2016-04-12 HISTORY — PX: CORONARY ARTERY BYPASS GRAFT: SHX141

## 2016-04-12 HISTORY — PX: TEE WITHOUT CARDIOVERSION: SHX5443

## 2016-04-12 LAB — POCT I-STAT, CHEM 8
BUN: 29 mg/dL — ABNORMAL HIGH (ref 6–20)
BUN: 28 mg/dL — AB (ref 6–20)
BUN: 26 mg/dL — AB (ref 6–20)
BUN: 26 mg/dL — AB (ref 6–20)
BUN: 26 mg/dL — AB (ref 6–20)
CALCIUM ION: 1.25 mmol/L (ref 1.15–1.40)
CALCIUM ION: 1.11 mmol/L — AB (ref 1.15–1.40)
CHLORIDE: 107 mmol/L (ref 101–111)
CHLORIDE: 108 mmol/L (ref 101–111)
CHLORIDE: 104 mmol/L (ref 101–111)
CREATININE: 1.5 mg/dL — AB (ref 0.44–1.00)
CREATININE: 1.6 mg/dL — AB (ref 0.44–1.00)
CREATININE: 1.6 mg/dL — AB (ref 0.44–1.00)
Calcium, Ion: 1.3 mmol/L (ref 1.15–1.40)
Calcium, Ion: 1.09 mmol/L — ABNORMAL LOW (ref 1.15–1.40)
Calcium, Ion: 1.08 mmol/L — ABNORMAL LOW (ref 1.15–1.40)
Chloride: 102 mmol/L (ref 101–111)
Chloride: 104 mmol/L (ref 101–111)
Creatinine, Ser: 1.9 mg/dL — ABNORMAL HIGH (ref 0.44–1.00)
Creatinine, Ser: 1.7 mg/dL — ABNORMAL HIGH (ref 0.44–1.00)
GLUCOSE: 179 mg/dL — AB (ref 65–99)
GLUCOSE: 144 mg/dL — AB (ref 65–99)
Glucose, Bld: 182 mg/dL — ABNORMAL HIGH (ref 65–99)
Glucose, Bld: 152 mg/dL — ABNORMAL HIGH (ref 65–99)
Glucose, Bld: 163 mg/dL — ABNORMAL HIGH (ref 65–99)
HCT: 29 % — ABNORMAL LOW (ref 36.0–46.0)
HCT: 26 % — ABNORMAL LOW (ref 36.0–46.0)
HCT: 21 % — ABNORMAL LOW (ref 36.0–46.0)
HCT: 21 % — ABNORMAL LOW (ref 36.0–46.0)
HEMATOCRIT: 22 % — AB (ref 36.0–46.0)
HEMOGLOBIN: 7.1 g/dL — AB (ref 12.0–15.0)
Hemoglobin: 9.9 g/dL — ABNORMAL LOW (ref 12.0–15.0)
Hemoglobin: 8.8 g/dL — ABNORMAL LOW (ref 12.0–15.0)
Hemoglobin: 7.1 g/dL — ABNORMAL LOW (ref 12.0–15.0)
Hemoglobin: 7.5 g/dL — ABNORMAL LOW (ref 12.0–15.0)
POTASSIUM: 5 mmol/L (ref 3.5–5.1)
POTASSIUM: 5.3 mmol/L — AB (ref 3.5–5.1)
POTASSIUM: 5.4 mmol/L — AB (ref 3.5–5.1)
POTASSIUM: 6.3 mmol/L — AB (ref 3.5–5.1)
Potassium: 6.3 mmol/L (ref 3.5–5.1)
SODIUM: 139 mmol/L (ref 135–145)
Sodium: 140 mmol/L (ref 135–145)
Sodium: 136 mmol/L (ref 135–145)
Sodium: 136 mmol/L (ref 135–145)
Sodium: 134 mmol/L — ABNORMAL LOW (ref 135–145)
TCO2: 24 mmol/L (ref 0–100)
TCO2: 24 mmol/L (ref 0–100)
TCO2: 24 mmol/L (ref 0–100)
TCO2: 24 mmol/L (ref 0–100)
TCO2: 23 mmol/L (ref 0–100)

## 2016-04-12 LAB — VAS US DOPPLER PRE CABG
LCCADDIAS: -11 cm/s
LCCAPSYS: 119 cm/s
LEFT ECA DIAS: 0 cm/s
LEFT VERTEBRAL DIAS: -9 cm/s
LICADSYS: -77 cm/s
Left CCA dist sys: -91 cm/s
Left CCA prox dias: 12 cm/s
Left ICA dist dias: -22 cm/s
Left ICA prox dias: 21 cm/s
Left ICA prox sys: 114 cm/s
RCCADSYS: -66 cm/s
RCCAPDIAS: 17 cm/s
RIGHT ECA DIAS: 0 cm/s
RIGHT VERTEBRAL DIAS: -13 cm/s
Right CCA prox sys: 140 cm/s

## 2016-04-12 LAB — BASIC METABOLIC PANEL
ANION GAP: 6 (ref 5–15)
BUN: 32 mg/dL — ABNORMAL HIGH (ref 6–20)
CALCIUM: 9.5 mg/dL (ref 8.9–10.3)
CO2: 24 mmol/L (ref 22–32)
Chloride: 108 mmol/L (ref 101–111)
Creatinine, Ser: 2.07 mg/dL — ABNORMAL HIGH (ref 0.44–1.00)
GFR calc Af Amer: 29 mL/min — ABNORMAL LOW (ref >60)
GFR, EST NON AFRICAN AMERICAN: 25 mL/min — AB (ref >60)
GLUCOSE: 122 mg/dL — AB (ref 65–99)
POTASSIUM: 4.4 mmol/L (ref 3.5–5.1)
SODIUM: 138 mmol/L (ref 135–145)

## 2016-04-12 LAB — POCT I-STAT 4, (NA,K, GLUC, HGB,HCT)
GLUCOSE: 126 mg/dL — AB (ref 65–99)
HEMATOCRIT: 26 % — AB (ref 36.0–46.0)
HEMOGLOBIN: 8.8 g/dL — AB (ref 12.0–15.0)
Potassium: 5.5 mmol/L — ABNORMAL HIGH (ref 3.5–5.1)
Sodium: 137 mmol/L (ref 135–145)

## 2016-04-12 LAB — POCT I-STAT 3, ART BLOOD GAS (G3+)
ACID-BASE DEFICIT: 2 mmol/L (ref 0.0–2.0)
Acid-base deficit: 5 mmol/L — ABNORMAL HIGH (ref 0.0–2.0)
BICARBONATE: 23.5 mmol/L (ref 20.0–28.0)
Bicarbonate: 20.8 mmol/L (ref 20.0–28.0)
O2 SAT: 100 %
O2 SAT: 95 %
PCO2 ART: 39.6 mmHg (ref 32.0–48.0)
PH ART: 7.325 — AB (ref 7.350–7.450)
PO2 ART: 346 mmHg — AB (ref 83.0–108.0)
PO2 ART: 76 mmHg — AB (ref 83.0–108.0)
Patient temperature: 36.2
TCO2: 25 mmol/L (ref 0–100)
TCO2: 22 mmol/L (ref 0–100)
pCO2 arterial: 40.2 mmHg (ref 32.0–48.0)
pH, Arterial: 7.375 (ref 7.350–7.450)

## 2016-04-12 LAB — CBC
HEMATOCRIT: 29.3 % — AB (ref 36.0–46.0)
HEMATOCRIT: 25.7 % — AB (ref 36.0–46.0)
HEMATOCRIT: 25.8 % — AB (ref 36.0–46.0)
HEMOGLOBIN: 9.6 g/dL — AB (ref 12.0–15.0)
HEMOGLOBIN: 8.6 g/dL — AB (ref 12.0–15.0)
Hemoglobin: 8.5 g/dL — ABNORMAL LOW (ref 12.0–15.0)
MCH: 28 pg (ref 26.0–34.0)
MCH: 28 pg (ref 26.0–34.0)
MCH: 28.2 pg (ref 26.0–34.0)
MCHC: 32.8 g/dL (ref 30.0–36.0)
MCHC: 33.1 g/dL (ref 30.0–36.0)
MCHC: 33.3 g/dL (ref 30.0–36.0)
MCV: 85.4 fL (ref 78.0–100.0)
MCV: 84.5 fL (ref 78.0–100.0)
MCV: 84.6 fL (ref 78.0–100.0)
PLATELETS: 223 10*3/uL (ref 150–400)
Platelets: 140 10*3/uL — ABNORMAL LOW (ref 150–400)
Platelets: 124 10*3/uL — ABNORMAL LOW (ref 150–400)
RBC: 3.43 MIL/uL — AB (ref 3.87–5.11)
RBC: 3.04 MIL/uL — ABNORMAL LOW (ref 3.87–5.11)
RBC: 3.05 MIL/uL — AB (ref 3.87–5.11)
RDW: 13.9 % (ref 11.5–15.5)
RDW: 14.4 % (ref 11.5–15.5)
RDW: 15 % (ref 11.5–15.5)
WBC: 4.2 10*3/uL (ref 4.0–10.5)
WBC: 5.5 10*3/uL (ref 4.0–10.5)
WBC: 6.1 10*3/uL (ref 4.0–10.5)

## 2016-04-12 LAB — GLUCOSE, CAPILLARY
GLUCOSE-CAPILLARY: 97 mg/dL (ref 65–99)
GLUCOSE-CAPILLARY: 72 mg/dL (ref 65–99)
GLUCOSE-CAPILLARY: 81 mg/dL (ref 65–99)
GLUCOSE-CAPILLARY: 99 mg/dL (ref 65–99)
Glucose-Capillary: 132 mg/dL — ABNORMAL HIGH (ref 65–99)
Glucose-Capillary: 95 mg/dL (ref 65–99)

## 2016-04-12 LAB — PROTIME-INR
INR: 1.38
Prothrombin Time: 17.1 seconds — ABNORMAL HIGH (ref 11.4–15.2)

## 2016-04-12 LAB — HEMOGLOBIN AND HEMATOCRIT, BLOOD
HCT: 20.3 % — ABNORMAL LOW (ref 36.0–46.0)
Hemoglobin: 6.7 g/dL — CL (ref 12.0–15.0)

## 2016-04-12 LAB — HEPARIN LEVEL (UNFRACTIONATED): Heparin Unfractionated: 0.71 IU/mL — ABNORMAL HIGH (ref 0.30–0.70)

## 2016-04-12 LAB — PLATELET COUNT: PLATELETS: 128 10*3/uL — AB (ref 150–400)

## 2016-04-12 LAB — APTT: aPTT: 36 seconds (ref 24–36)

## 2016-04-12 LAB — PREPARE RBC (CROSSMATCH)

## 2016-04-12 SURGERY — CORONARY ARTERY BYPASS GRAFTING (CABG)
Anesthesia: General | Site: Chest

## 2016-04-12 MED ORDER — ROCURONIUM BROMIDE 10 MG/ML (PF) SYRINGE
PREFILLED_SYRINGE | INTRAVENOUS | Status: AC
  Filled 2016-04-12: qty 30

## 2016-04-12 MED ORDER — CALCIUM CHLORIDE 10 % IV SOLN
INTRAVENOUS | Status: AC
  Filled 2016-04-12: qty 10

## 2016-04-12 MED ORDER — DOCUSATE SODIUM 100 MG PO CAPS
200.0000 mg | ORAL_CAPSULE | Freq: Every day | ORAL | Status: DC
  Administered 2016-04-13 – 2016-04-14 (×2): 200 mg via ORAL
  Filled 2016-04-12 (×2): qty 2

## 2016-04-12 MED ORDER — PHENYLEPHRINE HCL 10 MG/ML IJ SOLN
INTRAVENOUS | Status: DC | PRN
  Administered 2016-04-12: 15 ug/min via INTRAVENOUS

## 2016-04-12 MED ORDER — FAMOTIDINE IN NACL 20-0.9 MG/50ML-% IV SOLN
20.0000 mg | Freq: Two times a day (BID) | INTRAVENOUS | Status: AC
  Administered 2016-04-12 (×2): 20 mg via INTRAVENOUS
  Filled 2016-04-12: qty 50

## 2016-04-12 MED ORDER — ONDANSETRON HCL 4 MG/2ML IJ SOLN
4.0000 mg | Freq: Four times a day (QID) | INTRAMUSCULAR | Status: DC | PRN
  Administered 2016-04-13 (×2): 4 mg via INTRAVENOUS
  Filled 2016-04-12 (×2): qty 2

## 2016-04-12 MED ORDER — ROCURONIUM BROMIDE 100 MG/10ML IV SOLN: INTRAVENOUS | Status: DC | PRN

## 2016-04-12 MED ORDER — SODIUM CHLORIDE 0.45 % IV SOLN: INTRAVENOUS | Status: DC | PRN

## 2016-04-12 MED ORDER — MORPHINE SULFATE (PF) 2 MG/ML IV SOLN: 1.0000 mg | INTRAVENOUS | Status: AC | PRN

## 2016-04-12 MED ORDER — PROTAMINE SULFATE 10 MG/ML IV SOLN
INTRAVENOUS | Status: DC | PRN
  Administered 2016-04-12: 90 mg via INTRAVENOUS
  Administered 2016-04-12: 25 mg via INTRAVENOUS
  Administered 2016-04-12: 50 mg via INTRAVENOUS
  Administered 2016-04-12: 35 mg via INTRAVENOUS

## 2016-04-12 MED ORDER — CALCIUM CHLORIDE 10 % IV SOLN
INTRAVENOUS | Status: DC | PRN
  Administered 2016-04-12: .1 g via INTRAVENOUS

## 2016-04-12 MED ORDER — INSULIN REGULAR BOLUS VIA INFUSION
0.0000 [IU] | Freq: Three times a day (TID) | INTRAVENOUS | Status: DC
  Filled 2016-04-12: qty 10

## 2016-04-12 MED ORDER — THROMBIN 20000 UNITS EX SOLR
CUTANEOUS | Status: AC
  Filled 2016-04-12: qty 20000

## 2016-04-12 MED ORDER — 0.9 % SODIUM CHLORIDE (POUR BTL) OPTIME
TOPICAL | Status: DC | PRN
  Administered 2016-04-12: 1000 mL

## 2016-04-12 MED ORDER — LACTATED RINGERS IV SOLN: INTRAVENOUS | Status: DC

## 2016-04-12 MED ORDER — FENTANYL CITRATE (PF) 250 MCG/5ML IJ SOLN
INTRAMUSCULAR | Status: AC
  Filled 2016-04-12: qty 5

## 2016-04-12 MED ORDER — BISACODYL 10 MG RE SUPP: 10.0000 mg | Freq: Every day | RECTAL | Status: DC

## 2016-04-12 MED ORDER — SODIUM CHLORIDE 0.9 % IV SOLN: 250.0000 mL | INTRAVENOUS | Status: DC

## 2016-04-12 MED ORDER — EPHEDRINE 5 MG/ML INJ
INTRAVENOUS | Status: AC
  Filled 2016-04-12: qty 10

## 2016-04-12 MED ORDER — MIDAZOLAM HCL 5 MG/5ML IJ SOLN
INTRAMUSCULAR | Status: DC | PRN
Start: 2016-04-12 — End: 2016-04-12
  Administered 2016-04-12 (×2): 2 mg via INTRAVENOUS
  Administered 2016-04-12: 3 mg via INTRAVENOUS
  Administered 2016-04-12: 1 mg via INTRAVENOUS
  Administered 2016-04-12: 2 mg via INTRAVENOUS

## 2016-04-12 MED ORDER — ROCURONIUM BROMIDE 10 MG/ML (PF) SYRINGE
PREFILLED_SYRINGE | INTRAVENOUS | Status: AC
  Filled 2016-04-12: qty 10

## 2016-04-12 MED ORDER — PANTOPRAZOLE SODIUM 40 MG PO TBEC
40.0000 mg | DELAYED_RELEASE_TABLET | Freq: Every day | ORAL | Status: DC
  Administered 2016-04-14: 40 mg via ORAL
  Filled 2016-04-12: qty 1

## 2016-04-12 MED ORDER — EPHEDRINE SULFATE 50 MG/ML IJ SOLN
INTRAMUSCULAR | Status: DC | PRN
  Administered 2016-04-12: 5 mg via INTRAVENOUS

## 2016-04-12 MED ORDER — DEXMEDETOMIDINE HCL IN NACL 200 MCG/50ML IV SOLN
0.0000 ug/kg/h | INTRAVENOUS | Status: DC
  Administered 2016-04-12: 0.4 ug/kg/h via INTRAVENOUS
  Filled 2016-04-12: qty 50

## 2016-04-12 MED ORDER — VANCOMYCIN HCL IN DEXTROSE 1-5 GM/200ML-% IV SOLN
1000.0000 mg | Freq: Once | INTRAVENOUS | Status: AC
  Administered 2016-04-12: 1000 mg via INTRAVENOUS
  Filled 2016-04-12: qty 200

## 2016-04-12 MED ORDER — PHENYLEPHRINE HCL 10 MG/ML IJ SOLN
INTRAMUSCULAR | Status: DC | PRN
  Administered 2016-04-12: 40 ug via INTRAVENOUS

## 2016-04-12 MED ORDER — DEXTROSE 5 % IV SOLN
1.5000 g | Freq: Two times a day (BID) | INTRAVENOUS | Status: DC
  Administered 2016-04-13 – 2016-04-14 (×3): 1.5 g via INTRAVENOUS
  Filled 2016-04-12 (×4): qty 1.5

## 2016-04-12 MED ORDER — FENTANYL CITRATE (PF) 250 MCG/5ML IJ SOLN
INTRAMUSCULAR | Status: AC
  Filled 2016-04-12: qty 20

## 2016-04-12 MED ORDER — SODIUM CHLORIDE 0.9% FLUSH: 3.0000 mL | INTRAVENOUS | Status: DC | PRN

## 2016-04-12 MED ORDER — PHENYLEPHRINE HCL 10 MG/ML IJ SOLN
0.0000 ug/min | INTRAVENOUS | Status: DC
  Administered 2016-04-12: 0 ug/min via INTRAVENOUS
  Filled 2016-04-12: qty 2

## 2016-04-12 MED ORDER — ASPIRIN EC 325 MG PO TBEC
325.0000 mg | DELAYED_RELEASE_TABLET | Freq: Every day | ORAL | Status: DC
  Administered 2016-04-13 – 2016-04-14 (×2): 325 mg via ORAL
  Filled 2016-04-12 (×2): qty 1

## 2016-04-12 MED ORDER — ACETAMINOPHEN 160 MG/5ML PO SOLN: 650.0000 mg | Freq: Once | ORAL | Status: AC

## 2016-04-12 MED ORDER — ACETAMINOPHEN 500 MG PO TABS
1000.0000 mg | ORAL_TABLET | Freq: Four times a day (QID) | ORAL | Status: DC
  Administered 2016-04-13 – 2016-04-14 (×4): 1000 mg via ORAL
  Filled 2016-04-12 (×4): qty 2

## 2016-04-12 MED ORDER — LACTATED RINGERS IV SOLN: 500.0000 mL | Freq: Once | INTRAVENOUS | Status: DC | PRN

## 2016-04-12 MED ORDER — CHLORHEXIDINE GLUCONATE 0.12% ORAL RINSE (MEDLINE KIT)
15.0000 mL | Freq: Two times a day (BID) | OROMUCOSAL | Status: DC
  Administered 2016-04-12 – 2016-04-19 (×7): 15 mL via OROMUCOSAL

## 2016-04-12 MED ORDER — ACETAMINOPHEN 160 MG/5ML PO SOLN
1000.0000 mg | Freq: Four times a day (QID) | ORAL | Status: DC
  Administered 2016-04-12: 1000 mg
  Filled 2016-04-12: qty 40.6

## 2016-04-12 MED ORDER — CHLORHEXIDINE GLUCONATE 0.12 % MT SOLN: 15.0000 mL | OROMUCOSAL | Status: AC

## 2016-04-12 MED ORDER — PROPOFOL 10 MG/ML IV BOLUS
INTRAVENOUS | Status: DC | PRN
  Administered 2016-04-12: 110 mg via INTRAVENOUS

## 2016-04-12 MED ORDER — FENTANYL CITRATE (PF) 250 MCG/5ML IJ SOLN
INTRAMUSCULAR | Status: DC | PRN
  Administered 2016-04-12: 50 ug via INTRAVENOUS
  Administered 2016-04-12: 200 ug via INTRAVENOUS
  Administered 2016-04-12 (×2): 150 ug via INTRAVENOUS
  Administered 2016-04-12: 250 ug via INTRAVENOUS
  Administered 2016-04-12: 750 ug via INTRAVENOUS
  Administered 2016-04-12: 100 ug via INTRAVENOUS
  Administered 2016-04-12: 200 ug via INTRAVENOUS

## 2016-04-12 MED ORDER — HEPARIN SODIUM (PORCINE) 1000 UNIT/ML IJ SOLN
INTRAMUSCULAR | Status: DC | PRN
  Administered 2016-04-12: 22000 [IU] via INTRAVENOUS

## 2016-04-12 MED ORDER — ROCURONIUM BROMIDE 10 MG/ML (PF) SYRINGE
PREFILLED_SYRINGE | INTRAVENOUS | Status: DC | PRN
  Administered 2016-04-12: 40 mg via INTRAVENOUS
  Administered 2016-04-12 (×2): 50 mg via INTRAVENOUS
  Administered 2016-04-12: 60 mg via INTRAVENOUS

## 2016-04-12 MED ORDER — METOPROLOL TARTRATE 25 MG/10 ML ORAL SUSPENSION: 12.5000 mg | Freq: Two times a day (BID) | ORAL | Status: DC

## 2016-04-12 MED ORDER — OXYCODONE HCL 5 MG PO TABS
5.0000 mg | ORAL_TABLET | ORAL | Status: DC | PRN
  Administered 2016-04-13 – 2016-04-14 (×5): 10 mg via ORAL
  Filled 2016-04-12 (×5): qty 2

## 2016-04-12 MED ORDER — SODIUM CHLORIDE 0.9% FLUSH
3.0000 mL | Freq: Two times a day (BID) | INTRAVENOUS | Status: DC
  Administered 2016-04-13 – 2016-04-14 (×3): 3 mL via INTRAVENOUS

## 2016-04-12 MED ORDER — TRAMADOL HCL 50 MG PO TABS: 50.0000 mg | ORAL_TABLET | ORAL | Status: DC | PRN

## 2016-04-12 MED ORDER — LACTATED RINGERS IV SOLN
INTRAVENOUS | Status: DC | PRN
  Administered 2016-04-12: 12:00:00 via INTRAVENOUS

## 2016-04-12 MED ORDER — BISACODYL 5 MG PO TBEC
10.0000 mg | DELAYED_RELEASE_TABLET | Freq: Every day | ORAL | Status: DC
Start: 2016-04-13 — End: 2016-04-14
  Administered 2016-04-13 – 2016-04-14 (×2): 10 mg via ORAL
  Filled 2016-04-12 (×2): qty 2

## 2016-04-12 MED ORDER — ALBUMIN HUMAN 5 % IV SOLN
INTRAVENOUS | Status: DC | PRN
  Administered 2016-04-12 (×2): via INTRAVENOUS

## 2016-04-12 MED ORDER — NITROGLYCERIN IN D5W 200-5 MCG/ML-% IV SOLN: 0.0000 ug/min | INTRAVENOUS | Status: DC

## 2016-04-12 MED ORDER — PROPOFOL 10 MG/ML IV BOLUS
INTRAVENOUS | Status: AC
  Filled 2016-04-12: qty 20

## 2016-04-12 MED ORDER — HEMOSTATIC AGENTS (NO CHARGE) OPTIME
TOPICAL | Status: DC | PRN
  Administered 2016-04-12: 1 via TOPICAL

## 2016-04-12 MED ORDER — INSULIN REGULAR HUMAN 100 UNIT/ML IJ SOLN
INTRAMUSCULAR | Status: DC
  Administered 2016-04-12: 0.4 [IU]/h via INTRAVENOUS
  Filled 2016-04-12: qty 2.5

## 2016-04-12 MED ORDER — ALBUMIN HUMAN 5 % IV SOLN
250.0000 mL | INTRAVENOUS | Status: AC | PRN
  Administered 2016-04-12 (×2): 250 mL via INTRAVENOUS

## 2016-04-12 MED ORDER — LACTATED RINGERS IV SOLN
INTRAVENOUS | Status: DC | PRN
  Administered 2016-04-12 (×2): via INTRAVENOUS

## 2016-04-12 MED ORDER — ASPIRIN 81 MG PO CHEW: 324.0000 mg | CHEWABLE_TABLET | Freq: Every day | ORAL | Status: DC

## 2016-04-12 MED ORDER — METOPROLOL TARTRATE 5 MG/5ML IV SOLN: 2.5000 mg | INTRAVENOUS | Status: DC | PRN

## 2016-04-12 MED ORDER — ORAL CARE MOUTH RINSE
15.0000 mL | Freq: Four times a day (QID) | OROMUCOSAL | Status: DC
Start: 2016-04-13 — End: 2016-04-19
  Administered 2016-04-12 – 2016-04-19 (×11): 15 mL via OROMUCOSAL

## 2016-04-12 MED ORDER — MIDAZOLAM HCL 2 MG/2ML IJ SOLN: 2.0000 mg | INTRAMUSCULAR | Status: DC | PRN

## 2016-04-12 MED ORDER — ACETAMINOPHEN 650 MG RE SUPP
650.0000 mg | Freq: Once | RECTAL | Status: AC
  Administered 2016-04-12: 650 mg via RECTAL

## 2016-04-12 MED ORDER — SODIUM CHLORIDE 0.9 % IV SOLN: INTRAVENOUS | Status: DC

## 2016-04-12 MED ORDER — GELATIN ABSORBABLE MT POWD
OROMUCOSAL | Status: DC | PRN
  Administered 2016-04-12 (×3): 1 mL via TOPICAL

## 2016-04-12 MED ORDER — METOPROLOL TARTRATE 12.5 MG HALF TABLET
12.5000 mg | ORAL_TABLET | Freq: Two times a day (BID) | ORAL | Status: DC
  Administered 2016-04-13 – 2016-04-14 (×2): 12.5 mg via ORAL
  Filled 2016-04-12 (×2): qty 1

## 2016-04-12 MED ORDER — MAGNESIUM SULFATE 4 GM/100ML IV SOLN
4.0000 g | Freq: Once | INTRAVENOUS | Status: AC
  Administered 2016-04-12: 4 g via INTRAVENOUS
  Filled 2016-04-12: qty 100

## 2016-04-12 MED ORDER — PHENYLEPHRINE 40 MCG/ML (10ML) SYRINGE FOR IV PUSH (FOR BLOOD PRESSURE SUPPORT)
PREFILLED_SYRINGE | INTRAVENOUS | Status: AC
  Filled 2016-04-12: qty 10

## 2016-04-12 MED ORDER — SODIUM CHLORIDE 0.9 % IV SOLN
INTRAVENOUS | Status: DC | PRN
  Administered 2016-04-12: 80 ug via INTRAVENOUS

## 2016-04-12 MED ORDER — MORPHINE SULFATE (PF) 2 MG/ML IV SOLN
2.0000 mg | INTRAVENOUS | Status: DC | PRN
  Administered 2016-04-13 (×3): 4 mg via INTRAVENOUS
  Administered 2016-04-13: 2 mg via INTRAVENOUS
  Administered 2016-04-13: 4 mg via INTRAVENOUS
  Administered 2016-04-13: 2 mg via INTRAVENOUS
  Administered 2016-04-14: 4 mg via INTRAVENOUS
  Administered 2016-04-14: 2 mg via INTRAVENOUS
  Filled 2016-04-12 (×2): qty 2
  Filled 2016-04-12 (×3): qty 1
  Filled 2016-04-12 (×2): qty 2
  Filled 2016-04-12: qty 1

## 2016-04-12 MED ORDER — POTASSIUM CHLORIDE 10 MEQ/50ML IV SOLN: 10.0000 meq | INTRAVENOUS | Status: DC

## 2016-04-12 MED ORDER — MIDAZOLAM HCL 10 MG/2ML IJ SOLN
INTRAMUSCULAR | Status: AC
  Filled 2016-04-12: qty 2

## 2016-04-12 MED FILL — Potassium Chloride Inj 2 mEq/ML: INTRAVENOUS | Qty: 40 | Status: AC

## 2016-04-12 MED FILL — Magnesium Sulfate Inj 50%: INTRAMUSCULAR | Qty: 10 | Status: AC

## 2016-04-12 MED FILL — Heparin Sodium (Porcine) Inj 1000 Unit/ML: INTRAMUSCULAR | Qty: 30 | Status: AC

## 2016-04-12 SURGICAL SUPPLY — 113 items
BAG DECANTER FOR FLEXI CONT (MISCELLANEOUS) ×4 IMPLANT
BANDAGE ACE 4X5 VEL STRL LF (GAUZE/BANDAGES/DRESSINGS) ×4 IMPLANT
BANDAGE ACE 6X5 VEL STRL LF (GAUZE/BANDAGES/DRESSINGS) ×4 IMPLANT
BANDAGE ELASTIC 4 VELCRO ST LF (GAUZE/BANDAGES/DRESSINGS) ×2 IMPLANT
BANDAGE ELASTIC 6 VELCRO ST LF (GAUZE/BANDAGES/DRESSINGS) ×2 IMPLANT
BASKET HEART  (ORDER IN 25'S) (MISCELLANEOUS) ×1
BASKET HEART (ORDER IN 25'S) (MISCELLANEOUS) ×1
BASKET HEART (ORDER IN 25S) (MISCELLANEOUS) ×2 IMPLANT
BLADE STERNUM SYSTEM 6 (BLADE) ×4 IMPLANT
BNDG GAUZE ELAST 4 BULKY (GAUZE/BANDAGES/DRESSINGS) ×4 IMPLANT
CANISTER SUCTION 2500CC (MISCELLANEOUS) ×4 IMPLANT
CATH ROBINSON RED A/P 18FR (CATHETERS) ×8 IMPLANT
CATH THORACIC 28FR (CATHETERS) ×4 IMPLANT
CATH THORACIC 36FR (CATHETERS) ×4 IMPLANT
CATH THORACIC 36FR RT ANG (CATHETERS) ×4 IMPLANT
CLIP TI MEDIUM 24 (CLIP) IMPLANT
CLIP TI WIDE RED SMALL 24 (CLIP) IMPLANT
CLOSURE WOUND 1/2 X4 (GAUZE/BANDAGES/DRESSINGS) ×1
CRADLE DONUT ADULT HEAD (MISCELLANEOUS) ×4 IMPLANT
DRAPE CARDIOVASCULAR INCISE (DRAPES) ×4
DRAPE SLUSH/WARMER DISC (DRAPES) ×4 IMPLANT
DRAPE SRG 135X102X78XABS (DRAPES) ×2 IMPLANT
DRSG COVADERM 4X14 (GAUZE/BANDAGES/DRESSINGS) ×4 IMPLANT
ELECT CAUTERY BLADE 6.4 (BLADE) ×4 IMPLANT
ELECT REM PT RETURN 9FT ADLT (ELECTROSURGICAL) ×8
ELECTRODE REM PT RTRN 9FT ADLT (ELECTROSURGICAL) ×4 IMPLANT
FELT TEFLON 1X6 (MISCELLANEOUS) ×8 IMPLANT
GAUZE SPONGE 4X4 12PLY STRL (GAUZE/BANDAGES/DRESSINGS) ×8 IMPLANT
GLOVE BIO SURGEON STRL SZ 6 (GLOVE) IMPLANT
GLOVE BIO SURGEON STRL SZ 6.5 (GLOVE) IMPLANT
GLOVE BIO SURGEON STRL SZ7 (GLOVE) IMPLANT
GLOVE BIO SURGEON STRL SZ7.5 (GLOVE) IMPLANT
GLOVE BIO SURGEONS STRL SZ 6.5 (GLOVE)
GLOVE BIOGEL PI IND STRL 6 (GLOVE) IMPLANT
GLOVE BIOGEL PI IND STRL 6.5 (GLOVE) IMPLANT
GLOVE BIOGEL PI IND STRL 7.0 (GLOVE) IMPLANT
GLOVE BIOGEL PI INDICATOR 6 (GLOVE)
GLOVE BIOGEL PI INDICATOR 6.5 (GLOVE)
GLOVE BIOGEL PI INDICATOR 7.0 (GLOVE)
GLOVE EUDERMIC 7 POWDERFREE (GLOVE) ×8 IMPLANT
GLOVE ORTHO TXT STRL SZ7.5 (GLOVE) IMPLANT
GOWN STRL REUS W/ TWL LRG LVL3 (GOWN DISPOSABLE) ×8 IMPLANT
GOWN STRL REUS W/ TWL XL LVL3 (GOWN DISPOSABLE) ×2 IMPLANT
GOWN STRL REUS W/TWL LRG LVL3 (GOWN DISPOSABLE) ×16
GOWN STRL REUS W/TWL XL LVL3 (GOWN DISPOSABLE) ×4
HEMOSTAT POWDER SURGIFOAM 1G (HEMOSTASIS) ×12 IMPLANT
HEMOSTAT SURGICEL 2X14 (HEMOSTASIS) ×4 IMPLANT
INSERT FOGARTY 61MM (MISCELLANEOUS) IMPLANT
INSERT FOGARTY XLG (MISCELLANEOUS) IMPLANT
KIT BASIN OR (CUSTOM PROCEDURE TRAY) ×4 IMPLANT
KIT CATH CPB BARTLE (MISCELLANEOUS) ×4 IMPLANT
KIT ROOM TURNOVER OR (KITS) ×4 IMPLANT
KIT SUCTION CATH 14FR (SUCTIONS) ×4 IMPLANT
KIT VASOVIEW HEMOPRO VH 3000 (KITS) ×4 IMPLANT
NS IRRIG 1000ML POUR BTL (IV SOLUTION) ×20 IMPLANT
PACK OPEN HEART (CUSTOM PROCEDURE TRAY) ×4 IMPLANT
PAD ARMBOARD 7.5X6 YLW CONV (MISCELLANEOUS) ×8 IMPLANT
PAD ELECT DEFIB RADIOL ZOLL (MISCELLANEOUS) ×4 IMPLANT
PENCIL BUTTON HOLSTER BLD 10FT (ELECTRODE) ×4 IMPLANT
PUNCH AORTIC ROTATE  4.5MM 8IN (MISCELLANEOUS) ×2 IMPLANT
PUNCH AORTIC ROTATE 4.0MM (MISCELLANEOUS) IMPLANT
PUNCH AORTIC ROTATE 4.5MM 8IN (MISCELLANEOUS) ×4 IMPLANT
PUNCH AORTIC ROTATE 5MM 8IN (MISCELLANEOUS) IMPLANT
SET CARDIOPLEGIA MPS 5001102 (MISCELLANEOUS) ×2 IMPLANT
SOLUTION ANTI FOG 6CC (MISCELLANEOUS) ×2 IMPLANT
SPONGE INTESTINAL PEANUT (DISPOSABLE) IMPLANT
SPONGE LAP 18X18 X RAY DECT (DISPOSABLE) ×2 IMPLANT
SPONGE LAP 4X18 X RAY DECT (DISPOSABLE) ×4 IMPLANT
STRIP CLOSURE SKIN 1/2X4 (GAUZE/BANDAGES/DRESSINGS) ×1 IMPLANT
SUT BONE WAX W31G (SUTURE) ×4 IMPLANT
SUT ETHIBOND 2 0 SH (SUTURE) ×16
SUT ETHIBOND 2 0 SH 36X2 (SUTURE) IMPLANT
SUT MNCRL AB 4-0 PS2 18 (SUTURE) IMPLANT
SUT PROLENE 3 0 SH DA (SUTURE) IMPLANT
SUT PROLENE 3 0 SH1 36 (SUTURE) ×4 IMPLANT
SUT PROLENE 4 0 RB 1 (SUTURE) ×4
SUT PROLENE 4 0 SH DA (SUTURE) IMPLANT
SUT PROLENE 4-0 RB1 .5 CRCL 36 (SUTURE) IMPLANT
SUT PROLENE 5 0 C 1 36 (SUTURE) ×4 IMPLANT
SUT PROLENE 6 0 C 1 30 (SUTURE) ×4 IMPLANT
SUT PROLENE 7 0 BV 1 (SUTURE) IMPLANT
SUT PROLENE 7 0 BV1 MDA (SUTURE) ×8 IMPLANT
SUT PROLENE 8 0 BV175 6 (SUTURE) ×4 IMPLANT
SUT SILK  1 MH (SUTURE) ×6
SUT SILK 1 MH (SUTURE) IMPLANT
SUT SILK 1 TIES 10X30 (SUTURE) ×2 IMPLANT
SUT SILK 2 0 SH CR/8 (SUTURE) ×4 IMPLANT
SUT SILK 2 0 TIES 10X30 (SUTURE) ×2 IMPLANT
SUT SILK 2 0 TIES 17X18 (SUTURE) ×4
SUT SILK 2-0 18XBRD TIE BLK (SUTURE) IMPLANT
SUT SILK 3 0 SH CR/8 (SUTURE) ×2 IMPLANT
SUT SILK 4 0 TIE 10X30 (SUTURE) ×4 IMPLANT
SUT STEEL 6MS V (SUTURE) ×4 IMPLANT
SUT STEEL STERNAL CCS#1 18IN (SUTURE) IMPLANT
SUT STEEL SZ 6 DBL 3X14 BALL (SUTURE) IMPLANT
SUT TEM PAC WIRE 2 0 SH (SUTURE) ×8 IMPLANT
SUT VIC AB 1 CTX 36 (SUTURE) ×16
SUT VIC AB 1 CTX36XBRD ANBCTR (SUTURE) ×4 IMPLANT
SUT VIC AB 2-0 CT1 27 (SUTURE) ×4
SUT VIC AB 2-0 CT1 TAPERPNT 27 (SUTURE) IMPLANT
SUT VIC AB 2-0 CTX 27 (SUTURE) ×4 IMPLANT
SUT VIC AB 3-0 SH 27 (SUTURE)
SUT VIC AB 3-0 SH 27X BRD (SUTURE) IMPLANT
SUT VIC AB 3-0 X1 27 (SUTURE) ×6 IMPLANT
SUT VICRYL 4-0 PS2 18IN ABS (SUTURE) IMPLANT
SUTURE E-PAK OPEN HEART (SUTURE) ×2 IMPLANT
SYSTEM SAHARA CHEST DRAIN ATS (WOUND CARE) ×4 IMPLANT
TOWEL OR 17X24 6PK STRL BLUE (TOWEL DISPOSABLE) ×4 IMPLANT
TOWEL OR 17X26 10 PK STRL BLUE (TOWEL DISPOSABLE) ×4 IMPLANT
TRAY FOLEY IC TEMP SENS 16FR (CATHETERS) ×4 IMPLANT
TUBING INSUFFLATION (TUBING) ×4 IMPLANT
UNDERPAD 30X30 (UNDERPADS AND DIAPERS) ×4 IMPLANT
WATER STERILE IRR 1000ML POUR (IV SOLUTION) ×8 IMPLANT

## 2016-04-12 NOTE — Progress Notes (Signed)
[place] for Heparin Indication: chest pain/ACS  Allergies  Allergen Reactions  . Iron Hives and Other (See Comments)    Elevated blood sugar, glaucoma worsened,   . Promethazine Other (See Comments)    Pt states her muscles jerk  . Wellbutrin [Bupropion Hcl] Nausea And Vomiting    Patient Measurements: Height: 5\' 3"  (160 cm) Weight: 198 lb 8 oz (90 kg) IBW/kg (Calculated) : 52.4 Heparin Dosing Weight: 74.4 kg  Vital Signs: Temp: 98 F (36.7 C) (11/03 0300) Temp Source: Oral (11/03 0300) BP: 140/67 (11/03 0300) Pulse Rate: 55 (11/03 0300)  Labs:  Recent Labs  04/10/16 0300 04/11/16 0340 04/11/16 1809 04/12/16 0459  HGB 9.9* 9.8*  --  9.6*  HCT 30.7* 30.8*  --  29.3*  PLT 202 210  --  223  APTT  --   --  112*  --   HEPARINUNFRC 0.64 0.64  --  0.71*  CREATININE  --   --  2.37* 2.07*    Estimated Creatinine Clearance: 30.8 mL/min (by C-G formula based on SCr of 2.07 mg/dL (H)).   . D-10-0.45% Sodium Chloride with KCL 40 meq/L 1000 ml Stopped (04/08/16 1829)  . heparin 1,050 Units/hr (04/11/16 2329)     Assessment:  60 y.o. female with CAD awaiting CABG after plavix washout. She continues on heparin post cath.  -heparin level = 0.71 and slightly above goal -CABG today  Goal of Therapy:  Heparin level 0.3-0.7 units/ml Monitor platelets by anticoagulation protocol: Yes   Plan:  -No heparin changes (to be discontinued for CABG) -Will follow post CABG  Hildred Laser, Pharm D 04/12/2016 9:47 AM

## 2016-04-12 NOTE — Anesthesia Preprocedure Evaluation (Addendum)
Anesthesia Evaluation  Patient identified by MRN, date of birth, ID band Patient awake    Reviewed: Allergy & Precautions, NPO status , Patient's Chart, lab work & pertinent test results, reviewed documented beta blocker date and time   Airway Mallampati: I  TM Distance: >3 FB Neck ROM: full    Dental  (+) Edentulous Lower, Edentulous Upper, Dental Advidsory Given   Pulmonary former smoker,    breath sounds clear to auscultation       Cardiovascular hypertension, Pt. on medications and Pt. on home beta blockers + angina + Past MI and +CHF   Rhythm:Regular Rate:Normal     Neuro/Psych negative neurological ROS  negative psych ROS   GI/Hepatic negative GI ROS, Neg liver ROS,   Endo/Other  diabetes, Type 2, Insulin Dependent  Renal/GU Renal disease  negative genitourinary   Musculoskeletal negative musculoskeletal ROS (+)   Abdominal   Peds negative pediatric ROS (+)  Hematology negative hematology ROS (+)   Anesthesia Other Findings - HLD  Reproductive/Obstetrics negative OB ROS                          Lab Results  Component Value Date   WBC 4.2 04/12/2016   HGB 9.6 (L) 04/12/2016   HCT 29.3 (L) 04/12/2016   MCV 85.4 04/12/2016   PLT 223 04/12/2016   Lab Results  Component Value Date   CREATININE 2.07 (H) 04/12/2016   BUN 32 (H) 04/12/2016   NA 138 04/12/2016   K 4.4 04/12/2016   CL 108 04/12/2016   CO2 24 04/12/2016   Lab Results  Component Value Date   INR 0.98 04/08/2016   INR 1.02 04/07/2016   04/2016 EKG: normal sinus rhythm, Q waves.  03/2016 Echo - Left ventricle: The cavity size was normal. Wall thickness was   increased in a pattern of mild LVH. Moderate septal hypertrophy.   Systolic function was vigorous. The estimated ejection fraction   was in the range of 65% to 70%. Wall motion was normal; there   were no regional wall motion abnormalities. Doppler  parameters   are consistent with abnormal left ventricular relaxation (grade 1   diastolic dysfunction). Doppler parameters are consistent with   high ventricular filling pressure. - Mitral valve: Calcified annulus.  Anesthesia Physical Anesthesia Plan  ASA: IV  Anesthesia Plan: General   Post-op Pain Management:    Induction: Intravenous  Airway Management Planned: Oral ETT  Additional Equipment: Arterial line, CVP, PA Cath, TEE and Ultrasound Guidance Line Placement  Intra-op Plan:   Post-operative Plan: Post-operative intubation/ventilation  Informed Consent: I have reviewed the patients History and Physical, chart, labs and discussed the procedure including the risks, benefits and alternatives for the proposed anesthesia with the patient or authorized representative who has indicated his/her understanding and acceptance.   Dental advisory given and Dental Advisory Given  Plan Discussed with: CRNA  Anesthesia Plan Comments:        Anesthesia Quick Evaluation

## 2016-04-12 NOTE — OR Nursing (Signed)
Forty-five minute call to SICU charge nurse at 1615. Spoke to [place].

## 2016-04-12 NOTE — Progress Notes (Signed)
Patient ID: April Pena, female   DOB: [birth year], 60 y.o.   MRN: [id number]  SICU Evening Rounds:   Hemodynamically stable  CI = 1.8  Still asleep on vent  Urine output good  CT output low  CBC    Component Value Date/Time   WBC 5.5 04/12/2016 1742   RBC 3.04 (L) 04/12/2016 1742   HGB 8.5 (L) 04/12/2016 1742   HCT 25.7 (L) 04/12/2016 1742   PLT 140 (L) 04/12/2016 1742   MCV 84.5 04/12/2016 1742   MCH 28.0 04/12/2016 1742   MCHC 33.1 04/12/2016 1742   RDW 14.4 04/12/2016 1742   LYMPHSABS 0.7 04/07/2016 1105   MONOABS 0.5 04/07/2016 1105   EOSABS 0.1 04/07/2016 1105   BASOSABS 0.0 04/07/2016 1105     BMET    Component Value Date/Time   NA 137 04/12/2016 1730   K 5.5 (H) 04/12/2016 1730   CL 104 04/12/2016 1617   CO2 24 04/12/2016 0459   GLUCOSE 126 (H) 04/12/2016 1730   BUN 26 (H) 04/12/2016 1617   CREATININE 1.60 (H) 04/12/2016 1617   CALCIUM 9.5 04/12/2016 0459   GFRNONAA 25 (L) 04/12/2016 0459   GFRAA 29 (L) 04/12/2016 0459     A/P:  Stable postop course. Continue current plans

## 2016-04-12 NOTE — OR Nursing (Signed)
Twenty minute call to SICU charge nurse at 1639. Spoke to [employer].

## 2016-04-12 NOTE — Brief Op Note (Signed)
04/07/2016 - 04/12/2016  3:35 PM  PATIENT:  April Pena  59 y.o. female  PRE-OPERATIVE DIAGNOSIS:  CAD  POST-OPERATIVE DIAGNOSIS:  CAD  PROCEDURE: CABGX3  TEE LEFT LEG EVH GREATER SAPHENOUS VEIN SEQ SVG-OM1-OM2 SVG-PD   SURGEON:  Surgeon(s) and Role:    * Gaye Pollack, MD - Primary  PHYSICIAN ASSISTANT: Dariyah Garduno PA-C  ANESTHESIA:   general  EBL:  Total I/O In: 1000 [I.V.:1000] Out: 400 [Urine:400]  DRAINS: ROUTINE   LOCAL MEDICATIONS USED:  NONE  SPECIMEN:  No Specimen  DISPOSITION OF SPECIMEN:  N/A  COUNTS:  YES  TOURNIQUET:  * No tourniquets in log *  DICTATION: .Dragon Dictation  PLAN OF CARE: Admit to inpatient   PATIENT DISPOSITION:  ICU - intubated and hemodynamically stable.   Delay start of Pharmacological VTE agent (>24hrs) due to surgical blood loss or risk of bleeding: yes  WT 90 KG  COMPLICATIONS NO KNOWN

## 2016-04-12 NOTE — Op Note (Signed)
CARDIOVASCULAR SURGERY OPERATIVE NOTE  04/12/2016  Surgeon:  Gaye Pollack, MD  First Assistant: Jadene Pierini, PA-C   Preoperative Diagnosis:  Severe multi-vessel coronary artery disease   Postoperative Diagnosis:  Same   Procedure:  1. Median Sternotomy 2. Extracorporeal circulation 3.   Coronary artery bypass grafting x 3   Sequential SVG to OM3 and OM2  SVG to PDA   4.   Endoscopic vein harvest from the left leg   Anesthesia:  General Endotracheal   Clinical History/Surgical Indication:  The patient is a 60 year old woman with a long history of hypertension and diabetes, renal failure s/p renal transplant, cataracts and glaucoma with left eye blindness as well as coronary artery disease s/p cath x 2 at [facility] in the past. She denies ever having a PCI but has been on Plavix for about 2 years. She was taken care of by Dr. Einar Gip until she left [facility]. 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 The pain usually occurs with exertion but has recently been occurring at rest and waking her from sleep. On the day of admission she developed pain that did not go away after 4 NTG and she went to [facility]. She had some lateral T-wave inversion and ST elevation anteriorly. She received MSO4 with relief. Troponin was 0.73 on presentation to [facility]. Troponin was elevated at 3.72 yesterday afternoon here and still 3.71 this am. Creat 1.85. Cath shows severe 2-vessel CAD with 99% proximal stenosis in OM2 and OM3, both of which are moderate-sized vessels. The RCA is diffusely diseased to 99% with left to right collat to the PDA. The LAD had 40% diffuse disease but no flow-limiting lesions. LVEDP was 18.  Preparation:  The patient was seen in the preoperative holding area and the correct patient, correct operation were confirmed with the patient after reviewing the  medical record and catheterization. The consent was signed by me. Preoperative antibiotics were given. A pulmonary arterial line and radial arterial line were placed by the anesthesia team. The patient was taken back to the operating room and positioned supine on the operating room table. After being placed under general endotracheal anesthesia by the anesthesia team a foley catheter was placed. The neck, chest, abdomen, and both legs were prepped with betadine soap and solution and draped in the usual sterile manner. A surgical time-out was taken and the correct patient and operative procedure were confirmed with the nursing and anesthesia staff.   Cardiopulmonary Bypass:  A median sternotomy was performed. The pericardium was opened in the midline. Right ventricular function appeared normal. The ascending aorta was of normal size and had no palpable plaque. There were no contraindications to aortic cannulation or cross-clamping. The patient was fully systemically heparinized and the ACT was maintained > 400 sec. The proximal aortic arch was cannulated with a 20 F aortic cannula for arterial inflow. Venous cannulation was performed via the right atrial appendage using a two-staged venous cannula. An antegrade cardioplegia/vent cannula was inserted into the mid-ascending aorta. Aortic occlusion was performed with a single cross-clamp. Systemic cooling to 32 degrees Centigrade and topical cooling of the heart with iced saline were used. Hyperkalemic antegrade cold blood cardioplegia was used to induce diastolic arrest and was then given at about 20 minute intervals throughout the period of arrest to maintain myocardial temperature at or below 10 degrees centigrade. A temperature probe was inserted into the interventricular septum and an insulating pad was placed in  the pericardium.   Endoscopic vein harvest:  The left greater saphenous vein was harvested endoscopically through a 2 cm incision medial to the  left knee. It was harvested from the upper thigh to below the knee. It was a medium-sized vein of good quality. The side branches were all ligated with 4-0 silk ties. We initially looked for the right GSV medial to the right knee through a small incision but could not find a suitable vein.    Coronary arteries:  The coronary arteries were examined.   LAD:  Diffusely diseased with calcified plaque extending out to apex. Luckily this vessel did not have any significant stenosis on cath because it is not graftable.  LCX:  OM2 is intramyocardial and diffusely diseased with calcified plaque. It was traced distally where it was diseased but graftable. The OM3 is also diffusely diseased with calcified plaque. There was one spot that was soft enough to open the vessel and there was still disease there.  RCA:  The RCA was diffusely diseased with calcified plaque extending out into the distal PDA. There was one spot in the proximal PDA that was graftable.   Grafts:  1. SVG to PDA: 1.6 mm.  It was sewn end to side using 7-0 prolene continuous suture. 2. Sequential SVG to OM3:  1.75 mm. It was sewn sequential side to side using 7-0 prolene continuous suture. 3. Sequential SVG to OM2:  1.6 mm. It was sewn sequential end to side using 7-0 prolene continuous suture.   The proximal vein graft anastomoses were performed to the mid-ascending aorta using continuous 6-0 prolene suture. Graft markers were placed around the proximal anastomoses.   Completion:  The patient was rewarmed to 37 degrees Centigrade.  The crossclamp was removed with a time of 67 minutes. There was spontaneous return of sinus rhythm. The distal and proximal anastomoses were checked for hemostasis. The position of the grafts was satisfactory. Two temporary epicardial pacing wires were placed on the right atrium and two on the right ventricle. The patient was weaned from CPB without difficulty on no inotropes. CPB time was 90 minutes.  Cardiac output was 4 LPM. Heparin was fully reversed with protamine and the aortic and venous cannulas removed. Hemostasis was achieved. Mediastinal and left pleural drainage tubes were placed. The sternum was closed with  #6 stainless steel wires. The fascia was closed with continuous # 1 vicryl suture. The subcutaneous tissue was closed with 2-0 vicryl continuous suture. The skin was closed with 3-0 vicryl subcuticular suture. All sponge, needle, and instrument counts were reported correct at the end of the case. Dry sterile dressings were placed over the incisions and around the chest tubes which were connected to pleurevac suction. The patient was then transported to the surgical intensive care unit in critical but stable condition.

## 2016-04-12 NOTE — Progress Notes (Addendum)
Patient Name: April Pena Date of Encounter: 04/12/2016  Primary Cardiologist: New  Dr. [facility] Problem List     Active Problems:   Troponin level elevated   NSTEMI (non-ST elevated myocardial infarction) [facility])   Hypertensive heart disease   Hyperlipidemia   Groin hematoma, initial encounter   Diabetes mellitus type 2 in obese (HCC)   Subjective   Feeling well. No chest pain, sob or palpitations. Hematoma is stable.   Inpatient Medications    Scheduled Meds: . amLODipine  10 mg Oral Daily  . aspirin EC  81 mg Oral Daily  . atorvastatin  40 mg Oral QHS  . carvedilol  12.5 mg Oral BID WC  . cefUROXime (ZINACEF)  IV  1.5 g Intravenous To OR  . cefUROXime (ZINACEF)  IV  750 mg Intravenous To OR  . chlorhexidine  15 mL Mouth/Throat Once  . dexmedetomidine  0.1-0.7 mcg/kg/hr Intravenous To OR  . diazepam  5 mg Oral Once  . DOPamine  0-10 mcg/kg/min Intravenous To OR  . dorzolamide-timolol  1 drop Right Eye BID  . epinephrine  0-10 mcg/min Intravenous To OR  . gabapentin  300 mg Oral Daily  . heparin-papaverine-plasmalyte irrigation   Irrigation To OR  . heparin 30,000 units/NS 1000 mL solution for CELLSAVER   Other To OR  . insulin aspart  0-15 Units Subcutaneous TID WC  . insulin aspart  0-5 Units Subcutaneous QHS  . insulin aspart  10 Units Subcutaneous TID WC  . insulin detemir  15 Units Subcutaneous QHS  . insulin (NOVOLIN-R) infusion   Intravenous To OR  . isosorbide mononitrate  60 mg Oral Daily  . magnesium oxide  400 mg Oral BID  . magnesium sulfate  40 mEq Other To OR  . metoprolol tartrate  12.5 mg Oral Once  . mycophenolate  360 mg Oral BID  . nitroGLYCERIN  2-200 mcg/min Intravenous To OR  . pantoprazole  40 mg Oral Daily  . phenylephrine (NEO-SYNEPHRINE) Adult infusion  30-200 mcg/min Intravenous To OR  . potassium chloride  80 mEq Other To OR  . predniSONE  5 mg Oral Daily  . sodium bicarbonate  650 mg Oral BID  . sodium chloride flush  3  mL Intravenous Q12H  . sulfamethoxazole-trimethoprim  1 tablet Oral Q M,W,F  . tacrolimus  2 mg Oral QHS  . tacrolimus  3 mg Oral Daily  . tranexamic acid (CYKLOKAPRON) infusion ([facility])  1.5 mg/kg/hr Intravenous To OR  . tranexamic acid  15 mg/kg Intravenous To OR  . tranexamic acid  2 mg/kg Intracatheter To OR  . vancomycin  1,250 mg Intravenous To OR   Continuous Infusions: . D-10-0.45% Sodium Chloride with KCL 40 meq/L 1000 ml Stopped (04/08/16 1829)  . heparin 1,050 Units/hr (04/11/16 2329)   PRN Meds: sodium chloride, acetaminophen, nitroGLYCERIN, ondansetron (ZOFRAN) IV, oxyCODONE-acetaminophen, prednisoLONE acetate, sodium chloride flush   Vital Signs    Vitals:   04/11/16 1127 04/11/16 1606 04/11/16 2125 04/12/16 0300  BP: 139/67 116/75 125/65 140/67  Pulse: 69 73 68 (!) 55  Resp: 20 20    Temp: 98.2 F (36.8 C) 98 F (36.7 C) 98.5 F (36.9 C) 98 F (36.7 C)  TempSrc: Oral Oral Oral Oral  SpO2: 100%  98% 96%  Weight:    198 lb 8 oz (90 kg)  Height:        Intake/Output Summary (Last 24 hours) at 04/12/16 0840 Last data filed at 04/12/16 0555  Gross per  24 hour  Intake            893.5 ml  Output             2175 ml  Net          -1281.5 ml   Filed Weights   04/07/16 1054 04/07/16 1408 04/12/16 0300  Weight: 195 lb (88.5 kg) 200 lb 9.9 oz (91 kg) 198 lb 8 oz (90 kg)    Physical Exam    GEN: Well nourished, well developed, in no acute distress.  HEENT: Grossly normal.  Neck: Supple, no JVD, carotid bruits, or masses. Cardiac: RRR, no murmurs, rubs, or gallops. No clubbing, cyanosis, edema.  Radials/DP/PT 2+ and equal bilaterally. R groin hematoma without bruit. Respiratory:  Respirations regular and unlabored, clear to auscultation bilaterally. GI: Soft, nontender, nondistended, BS + x 4. MS: no deformity or atrophy. Skin: warm and dry, no rash. Neuro:  Strength and sensation are intact. Psych: AAOx3.  Normal affect.  Labs    CBC  Recent Labs   04/11/16 0340 04/12/16 0459  WBC 4.2 4.2  HGB 9.8* 9.6*  HCT 30.8* 29.3*  MCV 87.3 85.4  PLT 210 885   Basic Metabolic Panel  Recent Labs  04/11/16 1809 04/12/16 0459  NA 137 138  K 5.4* 4.4  CL 107 108  CO2 21* 24  GLUCOSE 163* 122*  BUN 32* 32*  CREATININE 2.37* 2.07*  CALCIUM 9.8 9.5   Liver Function Tests  Recent Labs  04/11/16 1809  AST 39  ALT 26  ALKPHOS 82  BILITOT 0.6  PROT 7.5  ALBUMIN 4.3   No results for input(s): LIPASE, AMYLASE in the last 72 hours. Cardiac Enzymes No results for input(s): CKTOTAL, CKMB, CKMBINDEX, TROPONINI in the last 72 hours. BNP Invalid input(s): POCBNP D-Dimer No results for input(s): DDIMER in the last 72 hours. Hemoglobin A1C No results for input(s): HGBA1C in the last 72 hours. Fasting Lipid Panel No results for input(s): CHOL, HDL, LDLCALC, TRIG, CHOLHDL, LDLDIRECT in the last 72 hours. Thyroid Function Tests No results for input(s): TSH, T4TOTAL, T3FREE, THYROIDAB in the last 72 hours.  Invalid input(s): FREET3  Telemetry    Sinus rhythm at rate of 50-60s - Personally Reviewed  ECG  N/A  Radiology    No results found.  Cardiac Studies   LHC 04/08/16:  2nd Mrg lesion, 99 %stenosed.  Dist Cx-2 lesion, 100 %stenosed.  Dist Cx-1 lesion, 30 %stenosed.  3rd Mrg lesion, 99 %stenosed.  Prox RCA to Mid RCA lesion, 80 %stenosed.  Mid RCA to Dist RCA lesion, 99 %stenosed.  Ost 1st Diag to 1st Diag lesion, 99 %stenosed.  Mid LAD lesion, 40 %stenosed.  Dist LAD-1 lesion, 40 %stenosed.  Dist LAD-2 lesion, 40 %stenosed.  1. Severe double vessel CAD 2. The RCA is diffusely diseased in the proximal, mid and distal segments. This vessel is moderate in caliber but heavily calcified. This vessel is not a favorable target for PCI. (Of note, PCI of the RCA attempted at [facility] in 2016 and unsuccessful).  3. The Circumflex is a large caliber vessel with high grade stenosis in a moderate  caliber obtuse marginal branch and total occlusion of the mid AV groove segment. This entire vessel is heavily calcified and not favorable for PCI.  4. Moderate non-obstructive heavily calcified disease in the proximal, mid and distal LAD.  5. Chronic kidney disease, s/p renal transplant.    Echo 04/09/16: Study Conclusions  - Left  ventricle: The cavity size was normal. Wall thickness was increased in a pattern of mild LVH. Moderate septal hypertrophy. Systolic function was vigorous. The estimated ejection fraction was in the range of 65% to 70%. Wall motion was normal; there were no regional wall motion abnormalities. Doppler parameters are consistent with abnormal left ventricular relaxation (grade 1 diastolic dysfunction). Doppler parameters are consistent with high ventricular filling pressure. - Mitral valve: Calcified annulus.  Patient Profile     April Pena is a 74F with Hypertensive heart disease, hyperlipidemia, diabetes, status post renal transplant with residual chronic kidney disease here with NSTEMI.  Assessment & Plan    # NSTEMI: Peak of troponin = 3.71.  Cath showed severe two vessel CAD  (RCA, OM2, OM3, LCx) with non-obstructive LAD disease.  Given the complexity of the lesions and the fact that the RCA was attempted unsuccessfully at [facility], CABG was felt to be her best option.  For CABG later today -  Continue aspirin, heparin, Imdur, metoprolol, and atorvastatin.  She should be on a P2Y12 agent for one year post CABG.  # Hematoma: stable and improving. There is no bruit and she has good peripheral pulses. T If it is enlarging in the next couple hours we will obtain a groin ultrasound.  H/H has been stable.   # Hypertensive heart disease: BP better controlled since switching metoprolol to carvedilol.  Continue amlodipine.  She is not on an ACE inhibitor or ARB due to renal dysfunction.   # Hyperlipidemia:  LDL 74 this admission.  Increased  atorvastatin to 40 mg to achieve LDL less than 70. Needs LFT and Lipid panel in 4-6 weeks.   # Diabetes: she was hypoglycemic in afternoon. Reduced insuline dose. Improved. Followed closely.   Signed, Leanor Kail, PA  04/12/2016, 8:40 AM   History and all data above reviewed.  Patient examined.  I agree with the findings as above.  All available labs, radiology testing, previous records reviewed. Agree with documented assessment and plan. April Pena is a 76F with hypertensive heart disease, hyperlipidemia, diabetes, status post renal transplant with residual chronic kidney disease here with NSTEMI.  She is going for CABG today.  Hematoma is improving and H/H are stable.    Jaiquan Temme C. Oval Linsey, MD, [facility]  04/12/2016 11:53 AM

## 2016-04-12 NOTE — Anesthesia Procedure Notes (Signed)
Procedure Name: Intubation Date/Time: 04/12/2016 12:27 PM Performed by: Neldon Newport Pre-anesthesia Checklist: Timeout performed, Patient being monitored, Suction available, Emergency Drugs available and Patient identified Patient Re-evaluated:Patient Re-evaluated prior to inductionOxygen Delivery Method: Circle system utilized Preoxygenation: Pre-oxygenation with 100% oxygen Intubation Type: IV induction Ventilation: Mask ventilation without difficulty and Oral airway inserted - appropriate to patient size Laryngoscope Size: Mac and 4 Grade View: Grade I Tube type: Oral Tube size: 8.0 mm Number of attempts: 1 Placement Confirmation: breath sounds checked- equal and bilateral,  positive ETCO2 and ETT inserted through vocal cords under direct vision Secured at: 21 cm Tube secured with: Tape Dental Injury: Teeth and Oropharynx as per pre-operative assessment

## 2016-04-12 NOTE — Transfer of Care (Signed)
Immediate Anesthesia Transfer of Care Note  Patient: April Pena  Procedure(s) Performed: Procedure(s) with comments: CORONARY ARTERY BYPASS GRAFTING (CABG) times three using left saphenous vein harvested endoscopically (N/A) - SEQ SVG-OM1-OM2 SVG-PD TRANSESOPHAGEAL ECHOCARDIOGRAM (TEE) (N/A)  Patient Location: SICU  Anesthesia Type:General  Level of Consciousness: Patient remains intubated per anesthesia plan  Airway & Oxygen Therapy: Patient remains intubated per anesthesia plan and Patient placed on Ventilator (see vital sign flow sheet for setting)  Post-op Assessment: Report given to RN  Post vital signs: Reviewed and stable  Last Vitals:  Vitals:   04/11/16 2125 04/12/16 0300  BP: 125/65 140/67  Pulse: 68 (!) 55  Resp:    Temp: 36.9 C 36.7 C    Last Pain:  Vitals:   04/12/16 0921  TempSrc:   PainSc: 0-No pain      Patients Stated Pain Goal: 0 ([id number])  Complications: No apparent anesthesia complications

## 2016-04-12 NOTE — Progress Notes (Deleted)
  Echocardiogram 2D Echocardiogram has been performed.  April Pena 04/12/2016, 1:16 PM

## 2016-04-12 NOTE — Anesthesia Procedure Notes (Signed)
Central Venous Catheter Insertion Performed by: anesthesiologist 04/12/2016 12:04 PM Patient location: Pre-op. Preanesthetic checklist: patient identified, IV checked, site marked, risks and benefits discussed, surgical consent, monitors and equipment checked, pre-op evaluation, timeout performed and anesthesia consent Position: Trendelenburg Lidocaine 1% used for infiltration Landmarks identified and Seldinger technique used Catheter size: 9 Fr Central line was placed.MAC introducer Swan type and PA catheter depth:thermodilution and 50PA Cath depth:50 Procedure performed using ultrasound guided technique. Attempts: 1 Following insertion, line sutured and dressing applied. Post procedure assessment: blood return through all ports, free fluid flow and no air. Patient tolerated the procedure well with no immediate complications.

## 2016-04-12 NOTE — Progress Notes (Signed)
  Echocardiogram Echocardiogram Transesophageal has been performed.  April Pena 04/12/2016, 1:17 PM

## 2016-04-13 ENCOUNTER — Inpatient Hospital Stay (HOSPITAL_COMMUNITY): Payer: Medicaid Other

## 2016-04-13 LAB — POCT I-STAT, CHEM 8
BUN: 25 mg/dL — ABNORMAL HIGH (ref 6–20)
BUN: 21 mg/dL — ABNORMAL HIGH (ref 6–20)
CHLORIDE: 103 mmol/L (ref 101–111)
CREATININE: 1.8 mg/dL — AB (ref 0.44–1.00)
Calcium, Ion: 1.23 mmol/L (ref 1.15–1.40)
Calcium, Ion: 1.28 mmol/L (ref 1.15–1.40)
Chloride: 106 mmol/L (ref 101–111)
Creatinine, Ser: 1.9 mg/dL — ABNORMAL HIGH (ref 0.44–1.00)
GLUCOSE: 122 mg/dL — AB (ref 65–99)
GLUCOSE: 140 mg/dL — AB (ref 65–99)
HCT: 25 % — ABNORMAL LOW (ref 36.0–46.0)
HEMATOCRIT: 31 % — AB (ref 36.0–46.0)
HEMOGLOBIN: 8.5 g/dL — AB (ref 12.0–15.0)
Hemoglobin: 10.5 g/dL — ABNORMAL LOW (ref 12.0–15.0)
POTASSIUM: 5.9 mmol/L — AB (ref 3.5–5.1)
POTASSIUM: 4.6 mmol/L (ref 3.5–5.1)
Sodium: 137 mmol/L (ref 135–145)
Sodium: 136 mmol/L (ref 135–145)
TCO2: 21 mmol/L (ref 0–100)
TCO2: 23 mmol/L (ref 0–100)

## 2016-04-13 LAB — MAGNESIUM
Magnesium: 3.6 mg/dL — ABNORMAL HIGH (ref 1.7–2.4)
Magnesium: 3 mg/dL — ABNORMAL HIGH (ref 1.7–2.4)
Magnesium: 2.4 mg/dL (ref 1.7–2.4)

## 2016-04-13 LAB — GLUCOSE, CAPILLARY
GLUCOSE-CAPILLARY: 102 mg/dL — AB (ref 65–99)
GLUCOSE-CAPILLARY: 106 mg/dL — AB (ref 65–99)
GLUCOSE-CAPILLARY: 116 mg/dL — AB (ref 65–99)
GLUCOSE-CAPILLARY: 131 mg/dL — AB (ref 65–99)
GLUCOSE-CAPILLARY: 155 mg/dL — AB (ref 65–99)
GLUCOSE-CAPILLARY: 156 mg/dL — AB (ref 65–99)
GLUCOSE-CAPILLARY: 175 mg/dL — AB (ref 65–99)
GLUCOSE-CAPILLARY: 176 mg/dL — AB (ref 65–99)
GLUCOSE-CAPILLARY: 88 mg/dL (ref 65–99)
GLUCOSE-CAPILLARY: 96 mg/dL (ref 65–99)
Glucose-Capillary: 120 mg/dL — ABNORMAL HIGH (ref 65–99)
Glucose-Capillary: 130 mg/dL — ABNORMAL HIGH (ref 65–99)
Glucose-Capillary: 155 mg/dL — ABNORMAL HIGH (ref 65–99)

## 2016-04-13 LAB — CBC
HCT: 29.3 % — ABNORMAL LOW (ref 36.0–46.0)
HEMATOCRIT: 25.7 % — AB (ref 36.0–46.0)
HEMOGLOBIN: 8.4 g/dL — AB (ref 12.0–15.0)
Hemoglobin: 9.4 g/dL — ABNORMAL LOW (ref 12.0–15.0)
MCH: 27.5 pg (ref 26.0–34.0)
MCH: 27.2 pg (ref 26.0–34.0)
MCHC: 32.7 g/dL (ref 30.0–36.0)
MCHC: 32.1 g/dL (ref 30.0–36.0)
MCV: 84.3 fL (ref 78.0–100.0)
MCV: 84.9 fL (ref 78.0–100.0)
PLATELETS: 169 10*3/uL (ref 150–400)
Platelets: 138 10*3/uL — ABNORMAL LOW (ref 150–400)
RBC: 3.05 MIL/uL — AB (ref 3.87–5.11)
RBC: 3.45 MIL/uL — AB (ref 3.87–5.11)
RDW: 14.7 % (ref 11.5–15.5)
RDW: 14.8 % (ref 11.5–15.5)
WBC: 5.4 10*3/uL (ref 4.0–10.5)
WBC: 9 10*3/uL (ref 4.0–10.5)

## 2016-04-13 LAB — POCT I-STAT 3, ART BLOOD GAS (G3+)
Acid-base deficit: 4 mmol/L — ABNORMAL HIGH (ref 0.0–2.0)
Acid-base deficit: 3 mmol/L — ABNORMAL HIGH (ref 0.0–2.0)
BICARBONATE: 21.7 mmol/L (ref 20.0–28.0)
Bicarbonate: 21.5 mmol/L (ref 20.0–28.0)
O2 SAT: 99 %
O2 Saturation: 98 %
PCO2 ART: 35.3 mmHg (ref 32.0–48.0)
PH ART: 7.356 (ref 7.350–7.450)
PH ART: 7.392 (ref 7.350–7.450)
PO2 ART: 123 mmHg — AB (ref 83.0–108.0)
Patient temperature: 37
TCO2: 23 mmol/L (ref 0–100)
TCO2: 23 mmol/L (ref 0–100)
pCO2 arterial: 38.8 mmHg (ref 32.0–48.0)
pO2, Arterial: 111 mmHg — ABNORMAL HIGH (ref 83.0–108.0)

## 2016-04-13 LAB — BASIC METABOLIC PANEL
ANION GAP: 5 (ref 5–15)
BUN: 23 mg/dL — ABNORMAL HIGH (ref 6–20)
CALCIUM: 8.8 mg/dL — AB (ref 8.9–10.3)
CHLORIDE: 108 mmol/L (ref 101–111)
CO2: 23 mmol/L (ref 22–32)
Creatinine, Ser: 1.9 mg/dL — ABNORMAL HIGH (ref 0.44–1.00)
GFR calc non Af Amer: 28 mL/min — ABNORMAL LOW (ref >60)
GFR, EST AFRICAN AMERICAN: 32 mL/min — AB (ref >60)
Glucose, Bld: 125 mg/dL — ABNORMAL HIGH (ref 65–99)
Potassium: 4.7 mmol/L (ref 3.5–5.1)
Sodium: 136 mmol/L (ref 135–145)

## 2016-04-13 LAB — CREATININE, SERUM
CREATININE: 1.91 mg/dL — AB (ref 0.44–1.00)
Creatinine, Ser: 1.84 mg/dL — ABNORMAL HIGH (ref 0.44–1.00)
GFR calc Af Amer: 32 mL/min — ABNORMAL LOW (ref >60)
GFR calc Af Amer: 33 mL/min — ABNORMAL LOW (ref >60)
GFR calc non Af Amer: 29 mL/min — ABNORMAL LOW (ref >60)
GFR, EST NON AFRICAN AMERICAN: 27 mL/min — AB (ref >60)

## 2016-04-13 LAB — HEMOGLOBIN A1C
HEMOGLOBIN A1C: 8.3 % — AB (ref 4.8–5.6)
Mean Plasma Glucose: 192 mg/dL

## 2016-04-13 MED ORDER — PREDNISONE 5 MG PO TABS
5.0000 mg | ORAL_TABLET | Freq: Every day | ORAL | Status: DC
  Administered 2016-04-13 – 2016-04-19 (×7): 5 mg via ORAL
  Filled 2016-04-13 (×7): qty 1

## 2016-04-13 MED ORDER — MYCOPHENOLATE SODIUM 180 MG PO TBEC
360.0000 mg | DELAYED_RELEASE_TABLET | Freq: Two times a day (BID) | ORAL | Status: DC
  Administered 2016-04-13 – 2016-04-19 (×13): 360 mg via ORAL
  Filled 2016-04-13 (×13): qty 2

## 2016-04-13 MED ORDER — ENOXAPARIN SODIUM 40 MG/0.4ML ~~LOC~~ SOLN
40.0000 mg | Freq: Every day | SUBCUTANEOUS | Status: DC
  Administered 2016-04-13 – 2016-04-16 (×4): 40 mg via SUBCUTANEOUS
  Filled 2016-04-13 (×4): qty 0.4

## 2016-04-13 MED ORDER — METOCLOPRAMIDE HCL 5 MG/ML IJ SOLN
5.0000 mg | Freq: Four times a day (QID) | INTRAMUSCULAR | Status: AC
  Administered 2016-04-13 – 2016-04-14 (×4): 5 mg via INTRAVENOUS
  Filled 2016-04-13 (×4): qty 2

## 2016-04-13 MED ORDER — INSULIN DETEMIR 100 UNIT/ML ~~LOC~~ SOLN
20.0000 [IU] | Freq: Every day | SUBCUTANEOUS | Status: DC
  Administered 2016-04-13 – 2016-04-15 (×3): 20 [IU] via SUBCUTANEOUS
  Administered 2016-04-16: 12 [IU] via SUBCUTANEOUS
  Administered 2016-04-17: 15 [IU] via SUBCUTANEOUS
  Administered 2016-04-18: 20 [IU] via SUBCUTANEOUS
  Filled 2016-04-13 (×9): qty 0.2

## 2016-04-13 MED ORDER — INSULIN ASPART 100 UNIT/ML ~~LOC~~ SOLN
0.0000 [IU] | SUBCUTANEOUS | Status: DC
  Administered 2016-04-13 – 2016-04-14 (×5): 2 [IU] via SUBCUTANEOUS

## 2016-04-13 MED ORDER — INSULIN DETEMIR 100 UNIT/ML ~~LOC~~ SOLN: 20.0000 [IU] | Freq: Every day | SUBCUTANEOUS | Status: DC

## 2016-04-13 NOTE — Consult Note (Signed)
Renal Service Consult Note [facility]  April Pena 04/13/2016 [place] D Requesting Physician:  Dr Cyndia Bent  Reason for Consult:  Renal Tx patient sp CABG HPI: The patient is a 60 y.o. year-old with hx of DM2, HTN, known CAD, MI, glaucoma and ESRD w functional renal transplant (was on HD started 2006, got transplant at [facility] in 2011, per pt baseline creat is around "2").  She takes prograf/ myfortic/ pred , she knows the dosing of the first two, not the pred.  She was admitted on Oct 29th with refractory CP, hx of +cor disease by cath but was treated medically.  Taken to cath lab on 10/30 which showed severe 3 vessel disease.  She went for CABG on 11/3 ( yesterday).  The creatinine has bounced around before and after surgery (2.3 preop, 1.65- 2.0 postop), we are asked to see for CKD w renal transplant, address med dosing for transplant and r/o any other problems related to renal function.    Patient went on HD in 2006, then had transplant at [facility] in 2011.  She is followed by her renal MD Dr Joesph July in [facility].  She takes prograf 3mg  in am and 2 mg qpm; myfortic 360 mg bid; and prednisone.  She reports hist of prior GB removal, hysterectomy.  PMH of HTN, DM, Ckd.   Patient grew up in [place], [place], finished HS at [employer], went to college for 1 year. Had to quit school because of eye problems (glaucoma) that developed.  She went on  Disability. Had two kids and was married but divorced around age 46.  In her spare time she likes to watch TV reruns, like [employer] and [employer].      ROS  denies CP  no joint pain   no HA  no blurry vision  no rash  no diarrhea  no nausea/ vomiting  no dysuria  no difficulty voiding  no change in urine color    Past Medical History  Past Medical History:  Diagnosis Date  . Blind left eye   . Blood transfusion without reported diagnosis   . CHF (congestive heart failure) ([place])   . Diabetes mellitus   . Diabetes mellitus type 2 in  obese ([place]) 04/11/2016  . Glaucoma   . Glaucoma   . Groin hematoma, initial encounter 04/11/2016  . Hyperlipidemia 04/11/2016  . Hypertension   . Hypertensive heart disease 04/11/2016  . MI (myocardial infarction)   . Renal disorder    kidney transplant  . Unstable angina [facility])    Past Surgical History  Past Surgical History:  Procedure Laterality Date  . ABDOMINAL HYSTERECTOMY    . CARDIAC CATHETERIZATION N/A 04/08/2016   Procedure: Left Heart Cath and Coronary Angiography;  Surgeon: Burnell Blanks, MD;  Location: [place] [facility];  Service: Cardiovascular;  Laterality: N/A;  . CATARACT EXTRACTION    . CHOLECYSTECTOMY    . EYE SURGERY     cataract  . KIDNEY TRANSPLANT  2011  . TONSILLECTOMY    . TRIGGER FINGER RELEASE     Family History  Family History  Problem Relation Age of Onset  . Diabetes Mother   . Hypertension Mother   . Heart attack Mother   . Diabetes Father   . Hypertension Sister   . Diabetes Brother   . Hypertension Brother   . Diabetes Paternal Grandmother   . Diabetes Daughter   . Hypertension Daughter   . Hyperlipidemia Neg Hx   .  Sudden death Neg Hx    Social History  reports that she has quit smoking. She has never used smokeless tobacco. She reports that she does not drink alcohol or use drugs. Allergies  Allergies  Allergen Reactions  . Iron Hives and Other (See Comments)    Elevated blood sugar, glaucoma worsened,   . Promethazine Other (See Comments)    Pt states her muscles jerk  . Wellbutrin [Bupropion Hcl] Nausea And Vomiting   Home medications Prior to Admission medications   Medication Sig Start Date End Date Taking? Authorizing Provider  acetaminophen (TYLENOL) 500 MG tablet Take 500-1,000 mg by mouth every 6 (six) hours as needed (pain).    Yes Historical Provider, MD  amLODipine (NORVASC) 10 MG tablet Take 10 mg by mouth daily.   Yes Historical Provider, MD  aspirin 81 MG chewable tablet Chew 81-162 mg by mouth daily as  needed (chest pain).    Yes Historical Provider, MD  aspirin EC 81 MG tablet Take 81 mg by mouth daily.   Yes Historical Provider, MD  atorvastatin (LIPITOR) 20 MG tablet Take 20 mg by mouth at bedtime.    Yes Historical Provider, MD  clopidogrel (PLAVIX) 75 MG tablet Take 75 mg by mouth daily.   Yes Historical Provider, MD  dexlansoprazole (DEXILANT) 60 MG capsule Take 60 mg by mouth daily.   Yes Historical Provider, MD  dorzolamide-timolol (COSOPT) 22.3-6.8 MG/ML ophthalmic solution Place 1 drop into both eyes 2 (two) times daily.   Yes Historical Provider, MD  gabapentin (NEURONTIN) 300 MG capsule Take 1 capsule (300 mg total) by mouth 3 (three) times daily. Patient taking differently: Take 300 mg by mouth daily.  04/02/11 04/07/16 Yes Dene Gentry, MD  insulin aspart (NOVOLOG FLEXPEN) 100 UNIT/ML FlexPen Inject 10-30 Units into the skin 3 (three) times daily with meals. Based on CBG or carb count   Yes Historical Provider, MD  Insulin Detemir (LEVEMIR FLEXTOUCH) 100 UNIT/ML Pen Inject 10-20 Units into the skin at bedtime. Per sliding scale   Yes Historical Provider, MD  isosorbide mononitrate (IMDUR) 60 MG 24 hr tablet Take 60 mg by mouth daily.     Yes Historical Provider, MD  magnesium oxide (MAG-OX) 400 MG tablet Take 400 mg by mouth 2 (two) times daily.     Yes Historical Provider, MD  metoprolol succinate (TOPROL-XL) 100 MG 24 hr tablet Take 100 mg by mouth daily. Take with or immediately following a meal.   Yes Historical Provider, MD  mycophenolate (MYFORTIC) 180 MG EC tablet Take 360 mg by mouth 2 (two) times daily.    Yes Historical Provider, MD  nitroGLYCERIN (NITROSTAT) 0.4 MG SL tablet Place 0.4 mg under the tongue every 5 (five) minutes as needed for chest pain.   Yes Historical Provider, MD  oxyCODONE-acetaminophen (PERCOCET) 7.5-325 MG per tablet Take 1 tablet by mouth 2 (two) times daily. scheduled 03/17/11  Yes Historical Provider, MD  prednisoLONE acetate (PRED FORTE) 1 %  ophthalmic suspension Place 1 drop into the left eye 4 (four) times daily as needed (itching).   Yes Historical Provider, MD  predniSONE (DELTASONE) 5 MG tablet Take 5 mg by mouth daily.     Yes Historical Provider, MD  sodium bicarbonate 650 MG tablet Take 650 mg by mouth 2 (two) times daily.   Yes Historical Provider, MD  sulfamethoxazole-trimethoprim (BACTRIM,SEPTRA) 400-80 MG per tablet Take 1 tablet by mouth every Monday, Wednesday, and Friday.    Yes Historical Provider, MD  tacrolimus (PROGRAF)  1 MG capsule Take 2-3 mg by mouth See admin instructions. Take 3 capsules (3 mg) by mouth every morning and 2 capsules (2 mg) at night   Yes Historical Provider, MD   Liver Function Tests  Recent Labs Lab 04/11/16 1809  AST 39  ALT 26  ALKPHOS 82  BILITOT 0.6  PROT 7.5  ALBUMIN 4.3   No results for input(s): LIPASE, AMYLASE in the last 168 hours. CBC  Recent Labs Lab 04/07/16 1105  04/12/16 1742 04/12/16 2315 04/12/16 2322 04/13/16 0350  WBC 4.8  < > 5.5 6.1  --  5.4  NEUTROABS 3.4  --   --   --   --   --   HGB 10.7*  < > 8.5* 8.6* 8.5* 8.4*  HCT 33.9*  < > 25.7* 25.8* 25.0* 25.7*  MCV 88.7  < > 84.5 84.6  --  84.3  PLT 222  < > 140* 124*  --  138*  < > = values in this interval not displayed. Basic Metabolic Panel  Recent Labs Lab 04/07/16 1105 04/08/16 0345 04/11/16 1809 04/12/16 0459 04/12/16 1249 04/12/16 1415 04/12/16 1440 04/12/16 1529 04/12/16 1617 04/12/16 1730 04/12/16 2315 04/12/16 2322 04/13/16 0350  NA 137 136 137 138 140 139 136 136 134* 137  --  137 136  K 3.7 4.2 5.4* 4.4 5.0 5.3* 5.4* 6.3* 6.3* 5.5*  --  5.9* 4.7  CL 108 107 107 108 107 108 102 104 104  --   --  106 108  CO2 21* 21* 21* 24  --   --   --   --   --   --   --   --  23  GLUCOSE 248* 280* 163* 122* 179* 182* 163* 152* 144* 126*  --  122* 125*  BUN 36* 29* 32* 32* 29* 28* 26* 26* 26*  --   --  25* 23*  CREATININE 1.85* 1.85* 2.37* 2.07* 1.90* 1.70* 1.50* 1.60* 1.60*  --  1.91* 1.90*  1.90*  CALCIUM 9.2 9.2 9.8 9.5  --   --   --   --   --   --   --   --  8.8*   Iron/TIBC/Ferritin/ %Sat No results found for: IRON, TIBC, FERRITIN, IRONPCTSAT  Vitals:   04/13/16 1200 04/13/16 1300 04/13/16 1400 04/13/16 1500  BP: 131/63  137/71 137/71  Pulse: 81 82 78 77  Resp: (!) 21 (!) 22 (!) 22 14  Temp:      TempSrc:      SpO2: 98% 99% 99% 100%  Weight:      Height:       Exam Gen alert, no distress, L eye opaque cornea No rash, cyanosis or gangrene Sclera anicteric, throat clear  No jvd or bruits Chest clear bilat RRR no MRG, sternal dressing in place Abd soft ntnd no mass or ascites +bs GU foley draining clear yellow urine MS no joint effusions or deformity Ext no LE or UE edema / no wounds or ulcers Neuro is alert, Ox 3 , nf   Na 136  K 4.7   CO 23  BUN 23  Cr 1.90   Alb 4.3 Hb 8.4  Plt 138  UA cloudy o/w negative  Assessment: 1. 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  Meds reviewed , no adjustments needed. Volume is stable.  2. SP CABG - POD#1 3. DM on insulin 4. Blind left  eye 5. HL 6. HTN - on MTP and norvasc at home   Plan - cont supportive care, meds reviewed  Kelly Splinter MD [place] pager [phone]   04/13/2016, 3:41 PM

## 2016-04-13 NOTE — Progress Notes (Addendum)
1 Day Post-Op Procedure(s) (LRB): CORONARY ARTERY BYPASS GRAFTING (CABG) times three using left saphenous vein harvested endoscopically (N/A) TRANSESOPHAGEAL ECHOCARDIOGRAM (TEE) (N/A) Subjective:  Complains of chest wall pain.  Was slow waking up and did not get extubated until 4 am.  Objective: Vital signs in last 24 hours: Temp:  [95.9 F (35.5 C)-99.5 F (37.5 C)] 99.5 F (37.5 C) (11/04 0730) Pulse Rate:  [68-81] 80 (11/04 0730) Cardiac Rhythm: Atrial paced (11/04 0400) Resp:  [12-27] 23 (11/04 0730) BP: (89-148)/(56-80) 116/65 (11/04 0730) SpO2:  [99 %-100 %] 100 % (11/04 0730) Arterial Line BP: (120-166)/(50-70) 161/52 (11/04 0730) FiO2 (%):  [40 %-50 %] 40 % (11/04 0325) Weight:  [98.3 kg (216 lb 11.4 oz)] 98.3 kg (216 lb 11.4 oz) (11/04 0345)  Hemodynamic parameters for last 24 hours: PAP: (20-50)/(10-31) 27/11 CO:  [3.7 L/min-4.8 L/min] 4.8 L/min CI:  [1.9 L/min/m2-2.6 L/min/m2] 2.6 L/min/m2  Intake/Output from previous day: 11/03 0701 - 11/04 0700 In: 5560 [I.V.:3530; Blood:425; NG/GT:30; IV Piggyback:1575] Out: 4365 [Urine:2240; Emesis/NG output:200; Blood:1715; Chest Tube:210] Intake/Output this shift: No intake/output data recorded.  General appearance: alert and cooperative Neurologic: intact Heart: regular rate and rhythm, S1, S2 normal, no murmur, click, rub or gallop Lungs: clear to auscultation bilaterally Extremities: edema mild Wound: dressings dry  Lab Results:  Recent Labs  04/12/16 2315 04/12/16 2322 04/13/16 0350  WBC 6.1  --  5.4  HGB 8.6* 8.5* 8.4*  HCT 25.8* 25.0* 25.7*  PLT 124*  --  138*   BMET:  Recent Labs  04/12/16 0459  04/12/16 2322 04/13/16 0350  NA 138  < > 137 136  K 4.4  < > 5.9* 4.7  CL 108  < > 106 108  CO2 24  --   --  23  GLUCOSE 122*  < > 122* 125*  BUN 32*  < > 25* 23*  CREATININE 2.07*  < > 1.90* 1.90*  CALCIUM 9.5  --   --  8.8*  < > = values in this interval not displayed.  PT/INR:  Recent Labs   04/12/16 1742  LABPROT 17.1*  INR 1.38   ABG    Component Value Date/Time   PHART 7.392 04/13/2016 0528   HCO3 21.5 04/13/2016 0528   TCO2 23 04/13/2016 0528   ACIDBASEDEF 3.0 (H) 04/13/2016 0528   O2SAT 99.0 04/13/2016 0528   CBG (last 3)   Recent Labs  04/13/16 0028 04/13/16 0132 04/13/16 0235  GLUCAP 155* 176* 156*   CLINICAL DATA:  Status post CABG surgery.  Subsequent encounter.  EXAM: PORTABLE CHEST 1 VIEW  COMPARISON:  04/12/2016  FINDINGS: Cardiac silhouette is mildly enlarged.  No mediastinal widening.  There is persistent lung base opacity consistent with atelectasis. No pulmonary edema. No pneumothorax.  Right internal jugular Swan-Ganz catheter is stable with its tip in the main pulmonary artery. There stable mediastinal tube and left inferior hemi thorax chest tube. All other support apparatus has been removed.  IMPRESSION: 1. No acute findings or evidence of an operative complication. 2. Mild persistent lung base opacity consistent with atelectasis. 3. No pulmonary edema or mediastinal widening.  No pneumothorax. 4. Remaining support apparatus is stable and well positioned.   Electronically Signed   By: Lajean Manes M.D.   On: 04/13/2016 07:25  ECG: sinus, no acute changes  Assessment/Plan: S/P Procedure(s) (LRB): CORONARY ARTERY BYPASS GRAFTING (CABG) times three using left saphenous vein harvested endoscopically (N/A) TRANSESOPHAGEAL ECHOCARDIOGRAM (TEE) (N/A)  She is hemodynamically stable after CABG.  S/p kidney transplant with preop creat 1.8-2. Will hold off on diuretic for now and observe. Continue immunosuppressive meds. She was on prednisone and mycophenolate at home which were not continued here. Will ask nephrology to clarify. She has been followed by nephrologist in [facility]. DC chest tubes, swan and arterial line. OOB, mobilize, IS. DM: preop Hgb A1c 8.3. Start Levemir and SSI and stop drip.   LOS: 6 days     Gaye Pollack 04/13/2016

## 2016-04-13 NOTE — Progress Notes (Signed)
Patient ID: April Pena, female   DOB: Mar 01, 1956, 60 y.o.   MRN: [id number]  SICU Evening rounds:  Hemodynamically stable in sinus rhythm  Urine output good  BMET    Component Value Date/Time   NA 136 04/13/2016 1707   K 4.6 04/13/2016 1707   CL 103 04/13/2016 1707   CO2 23 04/13/2016 0350   GLUCOSE 140 (H) 04/13/2016 1707   BUN 21 (H) 04/13/2016 1707   CREATININE 1.80 (H) 04/13/2016 1707   CALCIUM 8.8 (L) 04/13/2016 0350   GFRNONAA 29 (L) 04/13/2016 1700   GFRAA 33 (L) 04/13/2016 1700   Only complaint is of nausea. Zofran helps, will add Reglan.

## 2016-04-13 NOTE — Procedures (Signed)
Extubation Procedure Note  Patient Details:   Name: April Pena DOB: Nov 07, 1955 MRN: [id number]   Airway Documentation:  Airway 8 mm (Active)  Secured at (cm) 23 cm 04/12/2016 11:02 PM  Measured From Lips 04/12/2016 11:02 PM  Secured Location Right 04/12/2016 11:02 PM  Secured By Pink Tape 04/12/2016 11:02 PM  Site Condition Dry 04/12/2016  5:15 PM    Evaluation  O2 sats: stable throughout Complications: No apparent complications Patient did tolerate procedure well. Bilateral Breath Sounds: Rhonchi, Diminished   Yes   Pt extubated to 4L [place] humidified. Pt able to vocalize post extubation. No complications.   Prior to extubation. NIF: -26 and VC: 838mL  Jerl Munyan A Blanch Media 04/13/2016, 4:06 AM

## 2016-04-14 ENCOUNTER — Inpatient Hospital Stay (HOSPITAL_COMMUNITY): Payer: Medicaid Other

## 2016-04-14 LAB — CBC
HCT: 31.4 % — ABNORMAL LOW (ref 36.0–46.0)
HEMOGLOBIN: 10.1 g/dL — AB (ref 12.0–15.0)
MCH: 27.4 pg (ref 26.0–34.0)
MCHC: 32.2 g/dL (ref 30.0–36.0)
MCV: 85.3 fL (ref 78.0–100.0)
PLATELETS: 172 10*3/uL (ref 150–400)
RBC: 3.68 MIL/uL — ABNORMAL LOW (ref 3.87–5.11)
RDW: 14.6 % (ref 11.5–15.5)
WBC: 11 10*3/uL — ABNORMAL HIGH (ref 4.0–10.5)

## 2016-04-14 LAB — GLUCOSE, CAPILLARY
GLUCOSE-CAPILLARY: 133 mg/dL — AB (ref 65–99)
GLUCOSE-CAPILLARY: 84 mg/dL (ref 65–99)
Glucose-Capillary: 134 mg/dL — ABNORMAL HIGH (ref 65–99)
Glucose-Capillary: 137 mg/dL — ABNORMAL HIGH (ref 65–99)
Glucose-Capillary: 121 mg/dL — ABNORMAL HIGH (ref 65–99)

## 2016-04-14 LAB — BASIC METABOLIC PANEL
Anion gap: 9 (ref 5–15)
BUN: 18 mg/dL (ref 6–20)
CALCIUM: 9.1 mg/dL (ref 8.9–10.3)
CO2: 23 mmol/L (ref 22–32)
CREATININE: 1.68 mg/dL — AB (ref 0.44–1.00)
Chloride: 102 mmol/L (ref 101–111)
GFR calc Af Amer: 37 mL/min — ABNORMAL LOW (ref >60)
GFR calc non Af Amer: 32 mL/min — ABNORMAL LOW (ref >60)
GLUCOSE: 148 mg/dL — AB (ref 65–99)
Potassium: 4.7 mmol/L (ref 3.5–5.1)
Sodium: 134 mmol/L — ABNORMAL LOW (ref 135–145)

## 2016-04-14 MED ORDER — SODIUM CHLORIDE 0.9% FLUSH
3.0000 mL | Freq: Two times a day (BID) | INTRAVENOUS | Status: DC
  Administered 2016-04-14 – 2016-04-18 (×6): 3 mL via INTRAVENOUS

## 2016-04-14 MED ORDER — INSULIN ASPART 100 UNIT/ML ~~LOC~~ SOLN
0.0000 [IU] | Freq: Three times a day (TID) | SUBCUTANEOUS | Status: DC
  Administered 2016-04-15: 16 [IU] via SUBCUTANEOUS
  Administered 2016-04-16: 12 [IU] via SUBCUTANEOUS
  Administered 2016-04-16: 8 [IU] via SUBCUTANEOUS
  Administered 2016-04-17: 2 [IU] via SUBCUTANEOUS
  Administered 2016-04-17: 6 [IU] via SUBCUTANEOUS
  Administered 2016-04-17: 2 [IU] via SUBCUTANEOUS
  Administered 2016-04-17: 8 [IU] via SUBCUTANEOUS
  Administered 2016-04-18: 12 [IU] via SUBCUTANEOUS
  Administered 2016-04-19: 8 [IU] via SUBCUTANEOUS

## 2016-04-14 MED ORDER — ONDANSETRON HCL 4 MG PO TABS
4.0000 mg | ORAL_TABLET | Freq: Four times a day (QID) | ORAL | Status: DC | PRN
  Administered 2016-04-15 (×2): 4 mg via ORAL
  Filled 2016-04-14 (×2): qty 1

## 2016-04-14 MED ORDER — METOPROLOL TARTRATE 12.5 MG HALF TABLET
12.5000 mg | ORAL_TABLET | Freq: Two times a day (BID) | ORAL | Status: DC
  Administered 2016-04-14 – 2016-04-16 (×5): 12.5 mg via ORAL
  Filled 2016-04-14 (×5): qty 1

## 2016-04-14 MED ORDER — FAMOTIDINE 20 MG PO TABS
20.0000 mg | ORAL_TABLET | Freq: Two times a day (BID) | ORAL | Status: DC
  Administered 2016-04-14 – 2016-04-16 (×5): 20 mg via ORAL
  Filled 2016-04-14 (×5): qty 1

## 2016-04-14 MED ORDER — ASPIRIN EC 81 MG PO TBEC
81.0000 mg | DELAYED_RELEASE_TABLET | Freq: Every day | ORAL | Status: DC
  Administered 2016-04-15 – 2016-04-19 (×5): 81 mg via ORAL
  Filled 2016-04-14 (×5): qty 1

## 2016-04-14 MED ORDER — SODIUM CHLORIDE 0.9 % IV SOLN: 250.0000 mL | INTRAVENOUS | Status: DC | PRN

## 2016-04-14 MED ORDER — BISACODYL 5 MG PO TBEC
10.0000 mg | DELAYED_RELEASE_TABLET | Freq: Every day | ORAL | Status: DC | PRN
  Administered 2016-04-16: 10 mg via ORAL
  Filled 2016-04-14: qty 2

## 2016-04-14 MED ORDER — BISACODYL 10 MG RE SUPP
10.0000 mg | Freq: Every day | RECTAL | Status: DC | PRN
  Administered 2016-04-17 – 2016-04-18 (×2): 10 mg via RECTAL
  Filled 2016-04-14 (×2): qty 1

## 2016-04-14 MED ORDER — DOCUSATE SODIUM 100 MG PO CAPS
200.0000 mg | ORAL_CAPSULE | Freq: Every day | ORAL | Status: DC
  Administered 2016-04-15 – 2016-04-19 (×5): 200 mg via ORAL
  Filled 2016-04-14 (×5): qty 2

## 2016-04-14 MED ORDER — SODIUM CHLORIDE 0.9% FLUSH: 3.0000 mL | INTRAVENOUS | Status: DC | PRN

## 2016-04-14 MED ORDER — SIMETHICONE 80 MG PO CHEW
80.0000 mg | CHEWABLE_TABLET | Freq: Four times a day (QID) | ORAL | Status: DC | PRN
  Administered 2016-04-14: 80 mg via ORAL
  Filled 2016-04-14: qty 1

## 2016-04-14 MED ORDER — TRAMADOL HCL 50 MG PO TABS
50.0000 mg | ORAL_TABLET | Freq: Four times a day (QID) | ORAL | Status: DC | PRN
  Administered 2016-04-14 – 2016-04-15 (×2): 50 mg via ORAL
  Filled 2016-04-14 (×2): qty 1

## 2016-04-14 MED ORDER — ONDANSETRON HCL 4 MG/2ML IJ SOLN
4.0000 mg | Freq: Four times a day (QID) | INTRAMUSCULAR | Status: DC | PRN
  Administered 2016-04-15 – 2016-04-16 (×2): 4 mg via INTRAVENOUS
  Filled 2016-04-14 (×4): qty 2

## 2016-04-14 MED ORDER — MOVING RIGHT ALONG BOOK
Freq: Once | Status: AC
  Administered 2016-04-14: 1
  Filled 2016-04-14: qty 1

## 2016-04-14 MED ORDER — CLOPIDOGREL BISULFATE 75 MG PO TABS
75.0000 mg | ORAL_TABLET | Freq: Every day | ORAL | Status: DC
  Administered 2016-04-14 – 2016-04-19 (×6): 75 mg via ORAL
  Filled 2016-04-14 (×6): qty 1

## 2016-04-14 MED ORDER — ACETAMINOPHEN 325 MG PO TABS: 650.0000 mg | ORAL_TABLET | Freq: Four times a day (QID) | ORAL | Status: DC | PRN

## 2016-04-14 NOTE — Progress Notes (Signed)
[facility] Progress Note   Subjective: no c/o , Cr 1.68, good UOP  Vitals:   04/14/16 0500 04/14/16 0600 04/14/16 0700 04/14/16 0900  BP: (!) 164/67 (!) 146/69 139/69   Pulse: 93 98 90   Resp: (!) 35 (!) 23 19   Temp:    98.5 F (36.9 C)  TempSrc:    Oral  SpO2: 97% 99% 100%   Weight: 91.6 kg (202 lb)     Height:        Inpatient medications: . acetaminophen  1,000 mg Oral Q6H   Or  . acetaminophen (TYLENOL) oral liquid 160 mg/5 mL  1,000 mg Per Tube Q6H  . aspirin EC  325 mg Oral Daily   Or  . aspirin  324 mg Per Tube Daily  . atorvastatin  40 mg Oral QHS  . bisacodyl  10 mg Oral Daily   Or  . bisacodyl  10 mg Rectal Daily  . cefUROXime (ZINACEF)  IV  1.5 g Intravenous Q12H  . chlorhexidine gluconate (MEDLINE KIT)  15 mL Mouth Rinse BID  . docusate sodium  200 mg Oral Daily  . dorzolamide-timolol  1 drop Right Eye BID  . enoxaparin (LOVENOX) injection  40 mg Subcutaneous QHS  . insulin aspart  0-24 Units Subcutaneous Q4H  . insulin detemir  20 Units Subcutaneous Daily  . mouth rinse  15 mL Mouth Rinse QID  . metoCLOPramide (REGLAN) injection  5 mg Intravenous Q6H  . metoprolol tartrate  12.5 mg Oral BID   Or  . metoprolol tartrate  12.5 mg Per Tube BID  . mycophenolate  360 mg Oral BID  . pantoprazole  40 mg Oral Daily  . predniSONE  5 mg Oral Q breakfast  . sodium chloride flush  3 mL Intravenous Q12H  . sulfamethoxazole-trimethoprim  1 tablet Oral Q M,W,F  . tacrolimus  2 mg Oral QHS  . tacrolimus  3 mg Oral Daily   . sodium chloride Stopped (04/13/16 0800)  . sodium chloride    . sodium chloride 20 mL/hr at 04/12/16 1900  . insulin (NOVOLIN-R) infusion Stopped (04/13/16 1300)  . lactated ringers Stopped (04/13/16 1800)  . nitroGLYCERIN Stopped (04/13/16 0400)  . phenylephrine (NEO-SYNEPHRINE) Adult infusion 0 mcg/min (04/12/16 1852)   sodium chloride, metoprolol, morphine injection, ondansetron (ZOFRAN) IV, oxyCODONE, sodium chloride  flush, traMADol  Exam: Gen alert, no distress No jvd or bruits Chest clear bilat RRR no MRG, sternal dressing in place Abd soft ntnd no mass or ascites +bs GU foley draining clear yellow urine MS no joint effusions or deformity Ext no LE or UE edema / no wounds or ulcers Neuro is alert, Ox 3 , nf   UA cloudy o/w negative  Assessment/Rec: 1. 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  Creat 1.68 today. On proper medication, good UOP post CABG.  Will sign off.  2. SP CABG x 3 3. DM on insulin 4. Blind left eye 5. HL 6. HTN - on MTP and norvasc at home    [place] pager [phone]   04/14/2016, 9:13 AM    Recent Labs Lab 04/12/16 0459  04/13/16 0350 04/13/16 1700 04/13/16 1707 04/14/16 0500  NA 138  < > 136  --  136 134*  K 4.4  < > 4.7  --  4.6 4.7  CL 108  < > 108  --  103 102  CO2 24  --  23  --   --  23  GLUCOSE 122*  < > 125*  --  140* 148*  BUN 32*  < > 23*  --  21* 18  CREATININE 2.07*  < > 1.90* 1.84* 1.80* 1.68*  CALCIUM 9.5  --  8.8*  --   --  9.1  < > = values in this interval not displayed.  Recent Labs Lab 04/11/16 1809  AST 39  ALT 26  ALKPHOS 82  BILITOT 0.6  PROT 7.5  ALBUMIN 4.3    Recent Labs Lab 04/07/16 1105  04/13/16 0350 04/13/16 1700 04/13/16 1707 04/14/16 0500  WBC 4.8  < > 5.4 9.0  --  11.0*  NEUTROABS 3.4  --   --   --   --   --   HGB 10.7*  < > 8.4* 9.4* 10.5* 10.1*  HCT 33.9*  < > 25.7* 29.3* 31.0* 31.4*  MCV 88.7  < > 84.3 84.9  --  85.3  PLT 222  < > 138* 169  --  172  < > = values in this interval not displayed. Iron/TIBC/Ferritin/ %Sat No results found for: IRON, TIBC, FERRITIN, IRONPCTSAT

## 2016-04-14 NOTE — Progress Notes (Signed)
2 Days Post-Op Procedure(s) (LRB): CORONARY ARTERY BYPASS GRAFTING (CABG) times three using left saphenous vein harvested endoscopically (N/A) TRANSESOPHAGEAL ECHOCARDIOGRAM (TEE) (N/A) Subjective: No complaints. Walked around the ICU this am.  Objective: Vital signs in last 24 hours: Temp:  [98.5 F (36.9 C)-98.8 F (37.1 C)] 98.5 F (36.9 C) (11/05 0900) Pulse Rate:  [70-98] 97 (11/05 0900) Cardiac Rhythm: Normal sinus rhythm (11/05 0800) Resp:  [8-35] 8 (11/05 0900) BP: (100-164)/(57-82) 135/72 (11/05 0900) SpO2:  [92 %-100 %] 100 % (11/05 0900) Weight:  [91.6 kg (202 lb)] 91.6 kg (202 lb) (11/05 0500)  Hemodynamic parameters for last 24 hours:    Intake/Output from previous day: 11/04 0701 - 11/05 0700 In: 1224.3 [P.O.:960; I.V.:264.3] Out: 2300 [Urine:2180; Chest Tube:120] Intake/Output this shift: No intake/output data recorded.  General appearance: alert and cooperative Neurologic: intact Heart: regular rate and rhythm, S1, S2 normal, no murmur, click, rub or gallop Lungs: clear to auscultation bilaterally Extremities: extremities normal, atraumatic, no cyanosis or edema Wound: dressing dry  Lab Results:  Recent Labs  04/13/16 1700 04/13/16 1707 04/14/16 0500  WBC 9.0  --  11.0*  HGB 9.4* 10.5* 10.1*  HCT 29.3* 31.0* 31.4*  PLT 169  --  172   BMET:  Recent Labs  04/13/16 0350  04/13/16 1707 04/14/16 0500  NA 136  --  136 134*  K 4.7  --  4.6 4.7  CL 108  --  103 102  CO2 23  --   --  23  GLUCOSE 125*  --  140* 148*  BUN 23*  --  21* 18  CREATININE 1.90*  < > 1.80* 1.68*  CALCIUM 8.8*  --   --  9.1  < > = values in this interval not displayed.  PT/INR:  Recent Labs  04/12/16 1742  LABPROT 17.1*  INR 1.38   ABG    Component Value Date/Time   PHART 7.392 04/13/2016 0528   HCO3 21.5 04/13/2016 0528   TCO2 23 04/13/2016 1707   ACIDBASEDEF 3.0 (H) 04/13/2016 0528   O2SAT 99.0 04/13/2016 0528   CBG (last 3)   Recent Labs   04/13/16 2310 04/14/16 0439 04/14/16 0822  GLUCAP 175* 133* 134*   CLINICAL DATA:  Status post coronary bypass grafting  EXAM: PORTABLE CHEST 1 VIEW  COMPARISON:  04/13/2016  FINDINGS: Cardiac shadow remains enlarged. Postoperative changes are again seen. The Swan-Ganz catheter has been removed. Right jugular sheath remains. A mediastinal drain and pericardial drain have been removed as well. No acute bony abnormality is seen. Mild right upper lobe atelectatic changes are seen left basilar atelectasis remains.  IMPRESSION: Interval removal of tubes and lines. No pneumothorax is seen. Mild right upper lobe atelectasis is seen. Persistent left basilar atelectasis is noted.   Electronically Signed   By: Inez Catalina M.D.   On: 04/14/2016 08:00  Assessment/Plan: S/P Procedure(s) (LRB): CORONARY ARTERY BYPASS GRAFTING (CABG) times three using left saphenous vein harvested endoscopically (N/A) TRANSESOPHAGEAL ECHOCARDIOGRAM (TEE) (N/A)  She is hemodynamically stable in sinus rhythm.   S/p renal transplant. Good urine output and weight down, creat down.  DM: glucose under adequate control.  Transfer to 2W and continue mobilization.   LOS: 7 days    Gaye Pollack 04/14/2016

## 2016-04-15 ENCOUNTER — Encounter (HOSPITAL_COMMUNITY): Payer: Self-pay | Admitting: Surgery

## 2016-04-15 LAB — BASIC METABOLIC PANEL
Anion gap: 8 (ref 5–15)
BUN: 20 mg/dL (ref 6–20)
CALCIUM: 8.9 mg/dL (ref 8.9–10.3)
CHLORIDE: 102 mmol/L (ref 101–111)
CO2: 23 mmol/L (ref 22–32)
CREATININE: 1.8 mg/dL — AB (ref 0.44–1.00)
GFR calc non Af Amer: 29 mL/min — ABNORMAL LOW (ref >60)
GFR, EST AFRICAN AMERICAN: 34 mL/min — AB (ref >60)
GLUCOSE: 65 mg/dL (ref 65–99)
Potassium: 4.2 mmol/L (ref 3.5–5.1)
Sodium: 133 mmol/L — ABNORMAL LOW (ref 135–145)

## 2016-04-15 LAB — CBC
HCT: 30.7 % — ABNORMAL LOW (ref 36.0–46.0)
Hemoglobin: 10 g/dL — ABNORMAL LOW (ref 12.0–15.0)
MCH: 27.5 pg (ref 26.0–34.0)
MCHC: 32.6 g/dL (ref 30.0–36.0)
MCV: 84.6 fL (ref 78.0–100.0)
PLATELETS: 181 10*3/uL (ref 150–400)
RBC: 3.63 MIL/uL — ABNORMAL LOW (ref 3.87–5.11)
RDW: 14.3 % (ref 11.5–15.5)
WBC: 8.8 10*3/uL (ref 4.0–10.5)

## 2016-04-15 LAB — TYPE AND SCREEN
ABO/RH(D): O POS
Antibody Screen: NEGATIVE
UNIT DIVISION: 0
Unit division: 0

## 2016-04-15 LAB — GLUCOSE, CAPILLARY
GLUCOSE-CAPILLARY: 307 mg/dL — AB (ref 65–99)
Glucose-Capillary: 77 mg/dL (ref 65–99)
Glucose-Capillary: 148 mg/dL — ABNORMAL HIGH (ref 65–99)
Glucose-Capillary: 215 mg/dL — ABNORMAL HIGH (ref 65–99)

## 2016-04-15 MED ORDER — OXYCODONE-ACETAMINOPHEN 7.5-325 MG PO TABS
1.0000 | ORAL_TABLET | ORAL | Status: DC | PRN
  Administered 2016-04-15 (×4): 1 via ORAL
  Administered 2016-04-16: 2 via ORAL
  Administered 2016-04-16 (×2): 1 via ORAL
  Administered 2016-04-16 – 2016-04-19 (×5): 2 via ORAL
  Filled 2016-04-15 (×2): qty 2
  Filled 2016-04-15 (×2): qty 1
  Filled 2016-04-15: qty 2
  Filled 2016-04-15: qty 1
  Filled 2016-04-15 (×2): qty 2
  Filled 2016-04-15: qty 1
  Filled 2016-04-15 (×3): qty 2
  Filled 2016-04-15: qty 1

## 2016-04-15 MED ORDER — METOCLOPRAMIDE HCL 5 MG PO TABS
5.0000 mg | ORAL_TABLET | Freq: Three times a day (TID) | ORAL | Status: DC
  Administered 2016-04-15 – 2016-04-18 (×7): 5 mg via ORAL
  Filled 2016-04-15 (×9): qty 1

## 2016-04-15 NOTE — Anesthesia Postprocedure Evaluation (Signed)
Anesthesia Post Note  Patient: April Pena  Procedure(s) Performed: Procedure(s) (LRB): CORONARY ARTERY BYPASS GRAFTING (CABG) times three using left saphenous vein harvested endoscopically (N/A) TRANSESOPHAGEAL ECHOCARDIOGRAM (TEE) (N/A)  Patient location during evaluation: SICU Anesthesia Type: General Level of consciousness: sedated Pain management: pain level controlled Vital Signs Assessment: post-procedure vital signs reviewed and stable Respiratory status: patient remains intubated per anesthesia plan Cardiovascular status: stable Anesthetic complications: no    Last Vitals:  Vitals:   04/15/16 0700 04/15/16 0749  BP: 135/71   Pulse: 88   Resp: 18   Temp:  37.1 C    Last Pain:  Vitals:   04/15/16 0749  TempSrc: Oral  PainSc:                  Effie Berkshire

## 2016-04-15 NOTE — Progress Notes (Signed)
3 Days Post-Op Procedure(s) (LRB): CORONARY ARTERY BYPASS GRAFTING (CABG) times three using left saphenous vein harvested endoscopically (N/A) TRANSESOPHAGEAL ECHOCARDIOGRAM (TEE) (N/A) Subjective:  Complains of some nausea. No BM since surgery but passing gas.  Objective: Vital signs in last 24 hours: Temp:  [98.4 F (36.9 C)-99.6 F (37.6 C)] 98.7 F (37.1 C) (11/06 0749) Pulse Rate:  [80-97] 88 (11/06 0700) Cardiac Rhythm: Normal sinus rhythm (11/06 0730) Resp:  [8-27] 18 (11/06 0700) BP: (117-141)/(63-103) 135/71 (11/06 0700) SpO2:  [91 %-100 %] 99 % (11/06 0700) Weight:  [91.6 kg (201 lb 15.1 oz)] 91.6 kg (201 lb 15.1 oz) (11/06 0615)  Hemodynamic parameters for last 24 hours:    Intake/Output from previous day: 11/05 0701 - 11/06 0700 In: 720 [P.O.:720] Out: 850 [Urine:850] Intake/Output this shift: Total I/O In: 150 [P.O.:150] Out: -   General appearance: alert and cooperative Heart: regular rate and rhythm, S1, S2 normal, no murmur, click, rub or gallop Lungs: diminished breath sounds bibasilar Abdomen: soft, non-tender; bowel sounds normal; no masses,  no organomegaly Extremities: extremities normal, atraumatic, no cyanosis or edema Wound: incisions healing well  Lab Results:  Recent Labs  04/14/16 0500 04/15/16 0313  WBC 11.0* 8.8  HGB 10.1* 10.0*  HCT 31.4* 30.7*  PLT 172 181   BMET:  Recent Labs  04/14/16 0500 04/15/16 0313  NA 134* 133*  K 4.7 4.2  CL 102 102  CO2 23 23  GLUCOSE 148* 65  BUN 18 20  CREATININE 1.68* 1.80*  CALCIUM 9.1 8.9    PT/INR:  Recent Labs  04/12/16 1742  LABPROT 17.1*  INR 1.38   ABG    Component Value Date/Time   PHART 7.392 04/13/2016 0528   HCO3 21.5 04/13/2016 0528   TCO2 23 04/13/2016 1707   ACIDBASEDEF 3.0 (H) 04/13/2016 0528   O2SAT 99.0 04/13/2016 0528   CBG (last 3)   Recent Labs  04/14/16 1539 04/14/16 2246 04/15/16 0746  GLUCAP 121* 84 77    Assessment/Plan: S/P Procedure(s)  (LRB): CORONARY ARTERY BYPASS GRAFTING (CABG) times three using left saphenous vein harvested endoscopically (N/A) TRANSESOPHAGEAL ECHOCARDIOGRAM (TEE) (N/A)  Overall she is making progress. Nausea is likely due to diabetic gastroparesis and no BM yet. Will use Reglan and Zofran prn.  S/p renal transplant: Creatinine at baseline.  DM: glucose under good control  Continue IS and ambulation  Awaiting bed on 2W.   LOS: 8 days    April Pena 04/15/2016

## 2016-04-15 NOTE — Progress Notes (Signed)
CARDIAC REHAB PHASE I   PRE:  Rate/Rhythm: 84 SR  BP:  Supine:   Sitting: 160/65  Standing:    SaO2: 96%RA  MODE:  Ambulation: 300 ft   POST:  Rate/Rhythm: 93 SR  BP:  Supine: 185/79  Sitting:   Standing:    SaO2: 96%RA 1440-1500 Pt walked 300 ft on RA with gait belt use, rolling walker and asst x1. Gait fairly steady. Nausea better. Tired by end of walk and requested pain med. Notified RN. BP elevated after walk.   Graylon Good, RN BSN  04/15/2016 2:56 PM

## 2016-04-16 LAB — GLUCOSE, CAPILLARY
GLUCOSE-CAPILLARY: 257 mg/dL — AB (ref 65–99)
Glucose-Capillary: 92 mg/dL (ref 65–99)
Glucose-Capillary: 216 mg/dL — ABNORMAL HIGH (ref 65–99)
Glucose-Capillary: 97 mg/dL (ref 65–99)

## 2016-04-16 LAB — CBC
HEMATOCRIT: 30.1 % — AB (ref 36.0–46.0)
HEMOGLOBIN: 9.8 g/dL — AB (ref 12.0–15.0)
MCH: 27.5 pg (ref 26.0–34.0)
MCHC: 32.6 g/dL (ref 30.0–36.0)
MCV: 84.3 fL (ref 78.0–100.0)
Platelets: 234 10*3/uL (ref 150–400)
RBC: 3.57 MIL/uL — AB (ref 3.87–5.11)
RDW: 14.2 % (ref 11.5–15.5)
WBC: 9.3 10*3/uL (ref 4.0–10.5)

## 2016-04-16 LAB — BASIC METABOLIC PANEL
ANION GAP: 10 (ref 5–15)
BUN: 24 mg/dL — ABNORMAL HIGH (ref 6–20)
CALCIUM: 9.5 mg/dL (ref 8.9–10.3)
CHLORIDE: 102 mmol/L (ref 101–111)
CO2: 23 mmol/L (ref 22–32)
Creatinine, Ser: 2.02 mg/dL — ABNORMAL HIGH (ref 0.44–1.00)
GFR calc Af Amer: 30 mL/min — ABNORMAL LOW (ref >60)
GFR calc non Af Amer: 26 mL/min — ABNORMAL LOW (ref >60)
GLUCOSE: 98 mg/dL (ref 65–99)
POTASSIUM: 4.7 mmol/L (ref 3.5–5.1)
Sodium: 135 mmol/L (ref 135–145)

## 2016-04-16 MED ORDER — AMLODIPINE BESYLATE 10 MG PO TABS
10.0000 mg | ORAL_TABLET | Freq: Every day | ORAL | Status: DC
  Administered 2016-04-16 – 2016-04-19 (×4): 10 mg via ORAL
  Filled 2016-04-16 (×4): qty 1

## 2016-04-16 MED ORDER — LACTULOSE 10 GM/15ML PO SOLN
20.0000 g | Freq: Every day | ORAL | Status: DC | PRN
  Administered 2016-04-17: 20 g via ORAL
  Filled 2016-04-16: qty 30

## 2016-04-16 MED FILL — Mannitol IV Soln 20%: INTRAVENOUS | Qty: 500 | Status: AC

## 2016-04-16 MED FILL — Sodium Bicarbonate IV Soln 8.4%: INTRAVENOUS | Qty: 50 | Status: AC

## 2016-04-16 MED FILL — Heparin Sodium (Porcine) Inj 1000 Unit/ML: INTRAMUSCULAR | Qty: 10 | Status: AC

## 2016-04-16 MED FILL — Electrolyte-R (PH 7.4) Solution: INTRAVENOUS | Qty: 5000 | Status: AC

## 2016-04-16 MED FILL — Lidocaine HCl IV Inj 20 MG/ML: INTRAVENOUS | Qty: 5 | Status: AC

## 2016-04-16 MED FILL — Sodium Chloride IV Soln 0.9%: INTRAVENOUS | Qty: 1000 | Status: AC

## 2016-04-16 NOTE — Progress Notes (Signed)
CARDIAC REHAB PHASE I   PRE:  Rate/Rhythm: 104 ST  BP:  Supine:   Sitting: 179/76  Standing:    SaO2: 94%RA  MODE:  Ambulation: 400 ft   POST:  Rate/Rhythm: 106 ST  BP:  Supine:   Sitting: 190/76  Standing:    SaO2: 88-90%RA 1136-1205 Pt already walked once today and willing to walk with me now. Walked 400 ft on RA with rolling walker with steady gait. Sats a little low on return to room but improved with rest and deep breaths. RN aware of elevated BP.   Graylon Good, RN BSN  04/16/2016 12:02 PM

## 2016-04-16 NOTE — Progress Notes (Signed)
Inpatient Diabetes Program Recommendations  AACE/ADA: New Consensus Statement on Inpatient Glycemic Control (2015)  Target Ranges:  Prepandial:   less than 140 mg/dL      Peak postprandial:   less than 180 mg/dL (1-2 hours)      Critically ill patients:  140 - 180 mg/dL   Lab Results  Component Value Date   GLUCAP 92 04/16/2016   HGBA1C 8.3 (H) 04/12/2016    Review of Glycemic Control Results for April Pena, April Pena (MRN [id number]) as of 04/16/2016 10:17  Ref. Range 04/15/2016 07:46 04/15/2016 11:19 04/15/2016 16:39 04/15/2016 21:48 04/16/2016 06:41  Glucose-Capillary Latest Ref Range: 65 - 99 mg/dL 77 148 (H) 307 (H) 215 (H) 92   Diabetes history: DM2 Outpatient Diabetes medications: Novolog 10-30 units TIDAC, Levemir 10-20 units QHS Current orders for Inpatient glycemic control: Levemir 20 units qd+Novolog correction 0-24 units tid& hs  Inpatient Diabetes Program Recommendations:    Please consider  -Decrease correction to Novolog 0-15 units tid and 0-5 units hs -Add Novolog meal coverage 3-4 units tid if eats 50%  Thank you, Bethena Roys E. Damoni Erker, RN, MSN, CDE Inpatient Glycemic Control Team Team Pager [phone] (8am-5pm) 04/16/2016 10:25 AM    -

## 2016-04-16 NOTE — Progress Notes (Addendum)
[address]       [place] [address]             [phone]      4 Days Post-Op Procedure(s) (LRB): CORONARY ARTERY BYPASS GRAFTING (CABG) times three using left saphenous vein harvested endoscopically (N/A) TRANSESOPHAGEAL ECHOCARDIOGRAM (TEE) (N/A) Subjective: Feels poorly she says because of constipation. + flatus, no abdominal pain  Objective: Vital signs in last 24 hours: Temp:  [98.4 F (36.9 C)-99.2 F (37.3 C)] 99.1 F (37.3 C) (11/07 0511) Pulse Rate:  [84-90] 89 (11/07 0511) Cardiac Rhythm: Normal sinus rhythm (11/06 2044) Resp:  [13-18] 18 (11/07 0511) BP: (134-170)/(62-83) 160/76 (11/07 0511) SpO2:  [93 %-97 %] 95 % (11/07 0511) Weight:  [199 lb 4.8 oz (90.4 kg)] 199 lb 4.8 oz (90.4 kg) (11/07 0519)  Hemodynamic parameters for last 24 hours:    Intake/Output from previous day: 11/06 0701 - 11/07 0700 In: 510 [P.O.:510] Out: 100 [Urine:100] Intake/Output this shift: No intake/output data recorded.  General appearance: alert, cooperative and no distress Heart: regular rate and rhythm and soft systolic murmur, no rub Lungs: coarse BS Abdomen: soft, nontender, + BS Extremities: minor edema Wound: incis healing well  Lab Results:  Recent Labs  04/15/16 0313 04/16/16 0236  WBC 8.8 9.3  HGB 10.0* 9.8*  HCT 30.7* 30.1*  PLT 181 234   BMET:  Recent Labs  04/15/16 0313 04/16/16 0236  NA 133* 135  K 4.2 4.7  CL 102 102  CO2 23 23  GLUCOSE 65 98  BUN 20 24*  CREATININE 1.80* 2.02*  CALCIUM 8.9 9.5    PT/INR: No results for input(s): LABPROT, INR in the last 72 hours. ABG    Component Value Date/Time   PHART 7.392 04/13/2016 0528   HCO3 21.5 04/13/2016 0528   TCO2 23 04/13/2016 1707   ACIDBASEDEF 3.0 (H) 04/13/2016 0528   O2SAT 99.0 04/13/2016 0528   CBG (last 3)   Recent Labs  04/15/16 1639 04/15/16 2148 04/16/16 0641  GLUCAP 307* 215* 92    Meds Scheduled Meds: . aspirin EC  81 mg Oral Daily  .  atorvastatin  40 mg Oral QHS  . chlorhexidine gluconate (MEDLINE KIT)  15 mL Mouth Rinse BID  . clopidogrel  75 mg Oral Daily  . docusate sodium  200 mg Oral Daily  . dorzolamide-timolol  1 drop Right Eye BID  . enoxaparin (LOVENOX) injection  40 mg Subcutaneous QHS  . famotidine  20 mg Oral BID  . insulin aspart  0-24 Units Subcutaneous TID AC & HS  . insulin detemir  20 Units Subcutaneous Daily  . mouth rinse  15 mL Mouth Rinse QID  . metoCLOPramide  5 mg Oral TID AC  . metoprolol tartrate  12.5 mg Oral BID  . mycophenolate  360 mg Oral BID  . predniSONE  5 mg Oral Q breakfast  . sodium chloride flush  3 mL Intravenous Q12H  . sulfamethoxazole-trimethoprim  1 tablet Oral Q M,W,F  . tacrolimus  2 mg Oral QHS  . tacrolimus  3 mg Oral Daily   Continuous Infusions: PRN Meds:.sodium chloride, acetaminophen, bisacodyl **OR** bisacodyl, ondansetron **OR** ondansetron (ZOFRAN) IV, oxyCODONE-acetaminophen, simethicone, sodium chloride flush, traMADol  Xrays No results found.  Assessment/Plan: S/P Procedure(s) (LRB): CORONARY ARTERY BYPASS GRAFTING (CABG) times three using left saphenous vein harvested endoscopically (N/A) TRANSESOPHAGEAL ECHOCARDIOGRAM (TEE) (N/A)  1 doing well overall 2 creat slowly rising, will ask  nephrology to see again  3 sugars variable 90's to 300's  4 hopefully will have BM soon on current rx- will add a little lactulose   LOS: 9 days    GOLD,WAYNE E 04/16/2016   Chart reviewed, patient examined, agree with above. She feels better this evening but no BM yet. She drank some prune juice and lactulose ordered. Passing flatus.

## 2016-04-16 NOTE — Progress Notes (Signed)
[facility] Progress Note   Progress/ReConsultation Note  April Pena is a 60 y.o. year-old female with hx of DM2, HTN, CAD, MI, glaucoma and ESRD with functional renal transplant (was on HD started 13-Aug-2004, got [place] deceased donor renal transplant to right pelvis at [facility] in August 13, 2010, per pt baseline creat is around "2") and is on prograf/myfortic/prednisone. Transplant performed for ESRD thought to be 2/2 diabetes and had previously been dependent on eritoneal dialysis. Primary Nephrologist is Dr. Joesph July in [facility].   Admitted Oct 29th with refractory CP and found to have severe 3 vessel disease on cath 10/30. Underwent CABG on 11/3. Creatinine has fluctuated before and after surgery (2.3 post-cath, 1.65- 2.0 postop). Nephrology was consulted 11/4 for recommendations regarding medications for CKD w renal transplant and to r/o other problems related to renal function. Signed off for stable SCr and good UOP on 11/5. Since that time SCr has risen from 1.68 to 1.80 to 2.02, so Nephrology was asked to re-consult. She is on her home doses of prograf, myfortic, prednisone and bactrim. No nephrotoxins. Soft blood pressures 11/3 post op, past 24 hours -> hypertension.   Subjective:   Patient says her only concerns are nausea when trying to eat a full meal, chest pain at incision site, removal of bandage at incision site (concern for germs), and not having had a BM since admission. She reports SOB with talking and walking at times. No issues with bladder emptying since foley removed - no urgency, leakage. Constipated.   Objective:   BP (!) 162/73 (BP Location: Left Arm)   Pulse 89   Temp 98.7 F (37.1 C) (Oral)   Resp 18   Ht 5' 3"  (1.6 m)   Wt 199 lb 4.8 oz (90.4 kg)   SpO2 96%   BMI 35.30 kg/m   Intake/Output Summary (Last 24 hours) at 04/16/16 1317 Last data filed at 04/16/16 1230  Gross per 24 hour  Intake              720 ml  Output              650 ml  Net               70 ml    Weight change: -2 lb 10.3 oz (-1.198 kg)  Physical Exam: Gen: Obese female, very pleasant, resting in bed; L eye opaque cornea CVS: RRR, no murmurs, no JVD, no LE edema; sternotomy scar clean and dry Resp: CTAB though decreased breath sounds at bases, no increased WOB, on RA Abd: Distended but soft, mildly TTP, no rebound or guarding, + BS Ext: No edema, moves all spontaneously Neuro: AOx3   Recent Labs Lab 04/11/16 1809 04/12/16 0459  04/12/16 1617 04/12/16 1730  04/12/16 2322 04/13/16 0350 04/13/16 1700 04/13/16 1707 04/14/16 0500 04/15/16 0313 04/16/16 0236  NA 137 138  < > 134* 137  --  137 136  --  136 134* 133* 135  K 5.4* 4.4  < > 6.3* 5.5*  --  5.9* 4.7  --  4.6 4.7 4.2 4.7  CL 107 108  < > 104  --   --  106 108  --  103 102 102 102  CO2 21* 24  --   --   --   --   --  23  --   --  23 23 23   GLUCOSE 163* 122*  < > 144* 126*  --  122* 125*  --  140* 148*  65 98  BUN 32* 32*  < > 26*  --   --  25* 23*  --  21* 18 20 24*  CREATININE 2.37* 2.07*  < > 1.60*  --   < > 1.90* 1.90* 1.84* 1.80* 1.68* 1.80* 2.02*  CALCIUM 9.8 9.5  --   --   --   --   --  8.8*  --   --  9.1 8.9 9.5  < > = values in this interval not displayed.   Recent Labs Lab 04/13/16 1700 04/13/16 1707 04/14/16 0500 04/15/16 0313 04/16/16 0236  WBC 9.0  --  11.0* 8.8 9.3  HGB 9.4* 10.5* 10.1* 10.0* 9.8*  HCT 29.3* 31.0* 31.4* 30.7* 30.1*  MCV 84.9  --  85.3 84.6 84.3  PLT 169  --  172 181 234    Medications:    . aspirin EC  81 mg Oral Daily  . atorvastatin  40 mg Oral QHS  . chlorhexidine gluconate (MEDLINE KIT)  15 mL Mouth Rinse BID  . clopidogrel  75 mg Oral Daily  . docusate sodium  200 mg Oral Daily  . dorzolamide-timolol  1 drop Right Eye BID  . enoxaparin (LOVENOX) injection  40 mg Subcutaneous QHS  . famotidine  20 mg Oral BID  . insulin aspart  0-24 Units Subcutaneous TID AC & HS  . insulin detemir  20 Units Subcutaneous Daily  . mouth rinse  15 mL Mouth Rinse QID  .  metoCLOPramide  5 mg Oral TID AC  . metoprolol tartrate  12.5 mg Oral BID  . mycophenolate  360 mg Oral BID  . predniSONE  5 mg Oral Q breakfast  . sodium chloride flush  3 mL Intravenous Q12H  . sulfamethoxazole-trimethoprim  1 tablet Oral Q M,W,F  . tacrolimus  2 mg Oral QHS  . tacrolimus  3 mg Oral Daily   Background: April Pena is a 60 y.o. year-old female with hx of DM2, HTN, CAD, MI, glaucoma and ESRD with functional renal transplant (was on HD started 08-05-2004, got [place] deceased donor renal transplant to right pelvisat [facility] in 08-05-2010, per pt baseline creat is around "2") and is on prograf/myfortic/prednisone. Transplant performed for ESRD thought to be 2/2 diabetes and had previously been dependent peritoneal dialysis. Primary Nephrologist is Dr. Joesph July in [facility].   Admitted Oct 29th with refractory CP and found to have severe 3 vessel disease on cath 10/30. Underwent CABG on 11/3. Creatinine has fluctuated before and after surgery (2.3 post-cath, 1.65- 2.0 postop). Nephrology was consulted 11/4 for recommendations regarding medications for CKD w renal transplant and to r/o other problems related to renal function. Signed off for stable SCr and good UOP on 11/5. Since that time SCr has risen from 1.68 to 1.80 to 2.02, so Nephrology was asked to re-consult.   Per Care Everywhere, SCr has ranged from 1.75 to 2.02 over the last 6 months.   Assessment/ Plan:     1. 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 UOP incompletely charted yesterday. Per patient, urinating normally. Decreased PO intake due to nausea but tolerating fluids. If continues to take poor PO, could start light IVFs. Continue home medications of myfortic, prograf, prednisone and prophylactic bactrim. Says she is also on bicarb at home but CO2 > 20 during admission, so do not feel this needs to be restarted at this time. Will check prograf level to make sure appropriate. Will follow along to see tomorrow's SCr value  remains stable.  2. S/p CABG: Having pain at incision site. Management per CTS. Interested in continuing rehab through [facility] as does not have any support at home.   3. T2DM: Last Hgb A1c 8.3 on 04/12/16.   4. HTN: Systolic BPs in 438P/779Z. On only metoprolol 12.5 mg BID currently. On metoprolol succinate 100 mg, imdur 60 mg, and norvasc 10 mg at home -- though note from [place] mentions only norvasc and labetolol instead. Appears that imdur was stopped for low BPs on 11/3. Fluid status net negative 3.0 L since admission and weight stable. Restart norvasc 10 mg. If continues to be elevated, would also increase metoprolol.   Olene Floss, MD [place], PGY-2 04/16/2016, 1:17 PM   I have seen and examined this patient and agree with plan and assessment in the above note with renal recommendations and interventions highlighted in the note of Dr. Ola Spurr. Although creatinine is still in range it has been during this admission, I understand the concern over the trend of the last 3 days. She is on correct doses of immunosuppressive meds, appears euvolemic by exam, has had no urinary retention issues since foley was removed. Blood pressures are elevated and adjustments have been made in her BP meds. Will check her prograf level and followup renal function in the AM.   Tadarius Maland B,MD 04/16/2016 4:10 PM

## 2016-04-17 ENCOUNTER — Inpatient Hospital Stay (HOSPITAL_COMMUNITY): Payer: Medicaid Other

## 2016-04-17 DIAGNOSIS — N183 Chronic kidney disease, stage 3 unspecified: Secondary | ICD-10-CM | POA: Diagnosis present

## 2016-04-17 DIAGNOSIS — N189 Chronic kidney disease, unspecified: Secondary | ICD-10-CM

## 2016-04-17 DIAGNOSIS — N289 Disorder of kidney and ureter, unspecified: Secondary | ICD-10-CM

## 2016-04-17 DIAGNOSIS — Z940 Kidney transplant status: Secondary | ICD-10-CM

## 2016-04-17 LAB — CBC
HCT: 29.7 % — ABNORMAL LOW (ref 36.0–46.0)
HEMOGLOBIN: 9.5 g/dL — AB (ref 12.0–15.0)
MCH: 27.4 pg (ref 26.0–34.0)
MCHC: 32 g/dL (ref 30.0–36.0)
MCV: 85.6 fL (ref 78.0–100.0)
Platelets: 316 10*3/uL (ref 150–400)
RBC: 3.47 MIL/uL — AB (ref 3.87–5.11)
RDW: 14.3 % (ref 11.5–15.5)
WBC: 7.8 10*3/uL (ref 4.0–10.5)

## 2016-04-17 LAB — BASIC METABOLIC PANEL
ANION GAP: 8 (ref 5–15)
BUN: 28 mg/dL — ABNORMAL HIGH (ref 6–20)
CALCIUM: 9.4 mg/dL (ref 8.9–10.3)
CHLORIDE: 103 mmol/L (ref 101–111)
CO2: 22 mmol/L (ref 22–32)
Creatinine, Ser: 2.49 mg/dL — ABNORMAL HIGH (ref 0.44–1.00)
GFR calc Af Amer: 23 mL/min — ABNORMAL LOW (ref >60)
GFR calc non Af Amer: 20 mL/min — ABNORMAL LOW (ref >60)
GLUCOSE: 204 mg/dL — AB (ref 65–99)
Potassium: 4.9 mmol/L (ref 3.5–5.1)
Sodium: 133 mmol/L — ABNORMAL LOW (ref 135–145)

## 2016-04-17 LAB — GLUCOSE, CAPILLARY
GLUCOSE-CAPILLARY: 154 mg/dL — AB (ref 65–99)
GLUCOSE-CAPILLARY: 218 mg/dL — AB (ref 65–99)
GLUCOSE-CAPILLARY: 211 mg/dL — AB (ref 65–99)
Glucose-Capillary: 137 mg/dL — ABNORMAL HIGH (ref 65–99)

## 2016-04-17 MED ORDER — TACROLIMUS 1 MG PO CAPS
3.0000 mg | ORAL_CAPSULE | Freq: Every day | ORAL | Status: DC
  Administered 2016-04-18 – 2016-04-19 (×2): 3 mg via ORAL
  Filled 2016-04-17 (×2): qty 3

## 2016-04-17 MED ORDER — METOPROLOL TARTRATE 25 MG PO TABS
25.0000 mg | ORAL_TABLET | Freq: Two times a day (BID) | ORAL | Status: DC
  Administered 2016-04-17 – 2016-04-18 (×3): 25 mg via ORAL
  Filled 2016-04-17 (×3): qty 1

## 2016-04-17 MED ORDER — SODIUM CHLORIDE 0.9 % IV SOLN
INTRAVENOUS | Status: AC
  Administered 2016-04-17: 12:00:00 via INTRAVENOUS

## 2016-04-17 MED ORDER — FAMOTIDINE 20 MG PO TABS
20.0000 mg | ORAL_TABLET | Freq: Every day | ORAL | Status: DC
  Administered 2016-04-17 – 2016-04-19 (×3): 20 mg via ORAL
  Filled 2016-04-17 (×3): qty 1

## 2016-04-17 MED ORDER — ENOXAPARIN SODIUM 30 MG/0.3ML ~~LOC~~ SOLN
30.0000 mg | Freq: Every day | SUBCUTANEOUS | Status: DC
  Administered 2016-04-17 – 2016-04-18 (×2): 30 mg via SUBCUTANEOUS
  Filled 2016-04-17 (×2): qty 0.3

## 2016-04-17 NOTE — Progress Notes (Signed)
[facility] Progress Note   Progress/ReConsultation Note  April Pena is a 60 y.o. year-old female with hx of DM2, HTN, CAD, MI, glaucoma and ESRD with functional renal transplant (was on HD started 2004/08/22, got [place] deceased donor renal transplant to right pelvis at [facility] in 08/22/10, per pt baseline creat is around "2") and is on prograf/myfortic/prednisone. Transplant performed for ESRD thought to be 2/2 diabetes and had previously been dependent on eritoneal dialysis. Primary Nephrologist is Dr. Joesph July in [facility].   Admitted Oct 29th with refractory CP and found to have severe 3 vessel disease on cath 10/30. Underwent CABG on 11/3. Creatinine has fluctuated before and after surgery (2.3 post-cath, 1.65- 2.0 postop). Nephrology was consulted 11/4 for recommendations regarding medications for CKD w renal transplant and to r/o other problems related to renal function. Signed off for stable SCr and good UOP on 11/5. Since that time SCr has risen from 1.68 to 1.80 to 2.02, so Nephrology was asked to re-consult. She is on her home doses of prograf, myfortic, prednisone and bactrim. No nephrotoxins. Soft blood pressures 11/3 post op, past 24 hours -> hypertension.   Subjective:   Patient had no acute events overnight. Says she has been drinking a lot of juice but not as interested in food. Still has not had bowel movement, thinks she is going to have a suppository today.    Objective:   BP (!) 147/76 (BP Location: Left Arm)   Pulse 78   Temp 98.6 F (37 C) (Oral)   Resp 18   Ht 5' 3"  (1.6 m)   Wt 198 lb 14.4 oz (90.2 kg)   SpO2 95%   BMI 35.23 kg/m   Intake/Output Summary (Last 24 hours) at 04/17/16 0818 Last data filed at 04/17/16 0407  Gross per 24 hour  Intake             1020 ml  Output             1500 ml  Net             -480 ml   Weight change: -6.4 oz (-0.181 kg)  Physical Exam: Gen: Obese female, very pleasant, sitting in chair awaiting bath; L eye opaque  cornea HENT: MMM slightly tacky CVS: RRR, no murmurs, no JVD, no LE edema; sternotomy scar clean and dry Resp: CTAB though decreased breath sounds at bases, no increased WOB, on RA Abd: Distended but soft, no TTP, no rebound or guarding, + BS Ext: No edema, moves all spontaneously Neuro: AOx3   Recent Labs Lab 04/11/16 1809 04/12/16 0459  04/12/16 2322 04/13/16 0350 04/13/16 1700 04/13/16 1707 04/14/16 0500 04/15/16 0313 04/16/16 0236 04/17/16 0326  NA 137 138  < > 137 136  --  136 134* 133* 135 133*  K 5.4* 4.4  < > 5.9* 4.7  --  4.6 4.7 4.2 4.7 4.9  CL 107 108  < > 106 108  --  103 102 102 102 103  CO2 21* 24  --   --  23  --   --  23 23 23 22   GLUCOSE 163* 122*  < > 122* 125*  --  140* 148* 65 98 204*  BUN 32* 32*  < > 25* 23*  --  21* 18 20 24* 28*  CREATININE 2.37* 2.07*  < > 1.90* 1.90* 1.84* 1.80* 1.68* 1.80* 2.02* 2.49*  CALCIUM 9.8 9.5  --   --  8.8*  --   --  9.1 8.9 9.5 9.4  < > = values in this interval not displayed.   Recent Labs Lab 04/14/16 0500 04/15/16 0313 04/16/16 0236 04/17/16 0326  WBC 11.0* 8.8 9.3 7.8  HGB 10.1* 10.0* 9.8* 9.5*  HCT 31.4* 30.7* 30.1* 29.7*  MCV 85.3 84.6 84.3 85.6  PLT 172 181 234 316    Medications:    . amLODipine  10 mg Oral Daily  . aspirin EC  81 mg Oral Daily  . atorvastatin  40 mg Oral QHS  . chlorhexidine gluconate (MEDLINE KIT)  15 mL Mouth Rinse BID  . clopidogrel  75 mg Oral Daily  . docusate sodium  200 mg Oral Daily  . dorzolamide-timolol  1 drop Right Eye BID  . enoxaparin (LOVENOX) injection  40 mg Subcutaneous QHS  . famotidine  20 mg Oral BID  . insulin aspart  0-24 Units Subcutaneous TID AC & HS  . insulin detemir  20 Units Subcutaneous Daily  . mouth rinse  15 mL Mouth Rinse QID  . metoCLOPramide  5 mg Oral TID AC  . metoprolol tartrate  12.5 mg Oral BID  . mycophenolate  360 mg Oral BID  . predniSONE  5 mg Oral Q breakfast  . sodium chloride flush  3 mL Intravenous Q12H  .  sulfamethoxazole-trimethoprim  1 tablet Oral Q M,W,F  . tacrolimus  2 mg Oral QHS  . tacrolimus  3 mg Oral Daily   Background: April Pena is a 60 y.o. year-old female with hx of DM2, HTN, CAD, MI, glaucoma and ESRD with functional renal transplant (was on HD started August 31, 2004, got [place] deceased donor renal transplant to right pelvisat [facility] in Aug 31, 2010, per pt baseline creat is around "2") and is on prograf/myfortic/prednisone. Transplant performed for ESRD thought to be 2/2 diabetes and had previously been dependent peritoneal dialysis. Primary Nephrologist is Dr. Joesph July in [facility].   Admitted Oct 29th with refractory CP and found to have severe 3 vessel disease on cath 10/30. Underwent CABG on 11/3. Creatinine has fluctuated before and after surgery (2.3 post-cath, 1.65- 2.0 postop). Nephrology was consulted 11/4 for recommendations regarding medications for CKD w renal transplant and to r/o other problems related to renal function. Signed off for stable SCr and good UOP on 11/5. Since that time SCr has risen from 1.68 to 1.80 to 2.02, so Nephrology was asked to re-consult.   Per Care Everywhere, SCr has ranged from 1.75 to 2.02 over the last 6 months.   Assessment/ Plan:     1. 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 UOP adequate at 1.5 L. Per patient, urinating normally. Decreased PO intake due to nausea but tolerating fluids. Net negative 3.1 L. Also mildly hyponatremic. Will start light NS IVFs  At 50 ml/hr x 24 hours for likely dehydration. Continue home medications of myfortic, prograf, prednisone and prophylactic bactrim. Says she is also on bicarb at home but CO2 > 20 during admission, so do not feel this needs to be restarted at this time.Tacrolimus level pending. Will also obtain renal U/S today.   2. S/p CABG: Having pain at incision site. Management per CTS. Interested in continuing rehab through [facility] as does not have any support at home.   3. T2DM: Last Hgb A1c 8.3 on  04/12/16.   4. HTN: Systolic BPs in 119J-478G. Restarted norvasc 10 mg. On only metoprolol 12.5 mg BID currently. On metoprolol succinate 100 mg, imdur 60 mg, and norvasc 10 mg at home -- though note  from [place] mentions only norvasc and labetolol instead. Appears that imdur was stopped for low BPs on 11/3. Fluid status net negative 3.0 L since admission and weight stable. Would increase metoprolol today to 25 BID.   Olene Floss, MD [place], PGY-2 04/17/2016, 8:18 AM   I have seen and examined this patient and agree with plan and assessment in the above note with renal recommendations/intervention highlighted. Creatinine continues to tend upward, for unclear reasons to me. Plan 24 hours of slow IVF's to assure she is adequately hydrated. Ultrasound of her renal allograft. Adjusting BP meds.   Icelynn Onken B,MD 04/17/2016 12:05 PM

## 2016-04-17 NOTE — Progress Notes (Signed)
CARDIAC REHAB PHASE I   PRE:  Rate/Rhythm: 82 SR  BP:  Sitting: 181/86        SaO2: 97 RA  MODE:  Ambulation: 400 ft   POST:  Rate/Rhythm: 89 SR  BP:  Sitting: 170/81         SaO2: 95 RA  Pt concerned over not having had bowel movement yet, thinks walking might help her. Pt ambulated 400 ft on RA, rolling walker, hand held assist, steady gait, tolerated well. Pt c/o mild DOE, nausea, mild dizziness, declined rest stop. Encouraged additional ambulation today. Pt to edge of bed after walk, call bell within reach. Will follow.   [place], RN, BSN 04/17/2016 11:59 AM

## 2016-04-17 NOTE — Progress Notes (Signed)
Pt walked 300 ft this am around unit. No distress noted but soreness in the midline chest. Same soreness that has been there.

## 2016-04-17 NOTE — Progress Notes (Signed)
Pt's daughter pulled nurse aside to share that Pt has a history of paranoia, and has been IVC'd twice in [facility].  Daughter had impression that a psych eval was being planned, unable to confirm.  Pt was experiencing pronounced paranoia this morning during shift change report, witnessed by off going night and incoming day RN's, documented in day RN assessment and progress note.  Sticky note written, will report thoroughly to Charge RN and night RN.

## 2016-04-17 NOTE — Progress Notes (Signed)
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 amLODipine  10 mg Oral Daily  . aspirin EC  81 mg Oral Daily  . atorvastatin  40 mg Oral QHS  . chlorhexidine gluconate (MEDLINE KIT)  15 mL Mouth Rinse BID  . clopidogrel  75 mg Oral Daily  . docusate sodium  200 mg Oral Daily  . dorzolamide-timolol  1 drop Right Eye BID  . enoxaparin (LOVENOX) injection  40 mg Subcutaneous QHS  . famotidine  20 mg Oral Daily  . insulin aspart  0-24 Units Subcutaneous TID AC & HS  . insulin detemir  20 Units Subcutaneous Daily  . mouth rinse  15 mL Mouth Rinse QID  . metoCLOPramide  5 mg Oral TID AC  . metoprolol tartrate  25 mg Oral BID  . mycophenolate  360 mg Oral BID  . predniSONE  5 mg Oral Q breakfast  . sodium chloride flush  3 mL Intravenous Q12H  . sulfamethoxazole-trimethoprim  1 tablet Oral Q M,W,F  . tacrolimus  2 mg Oral QHS  . [START ON 04/18/2016] tacrolimus  3 mg Oral Q breakfast   Continuous Infusions: . sodium chloride     PRN Meds: sodium chloride, acetaminophen, bisacodyl **OR** bisacodyl, lactulose, ondansetron **OR** ondansetron (ZOFRAN) IV, oxyCODONE-acetaminophen, simethicone, sodium chloride flush, traMADol   Vital Signs    Vitals:   04/16/16 0519 04/16/16 1310 04/16/16 2050 04/17/16 0408  BP:  (!) 162/73 (!) 147/67 (!) 147/76  Pulse:  89 86 78  Resp:  18 18 18   Temp:  98.7 F (37.1 C) 98.3 F (36.8 C) 98.6 F (37 C)  TempSrc:  Oral  Oral Oral  SpO2:  96% 96% 95%  Weight: 199 lb 4.8 oz (90.4 kg)   198 lb 14.4 oz (90.2 kg)  Height:        Intake/Output Summary (Last 24 hours) at 04/17/16 1150 Last data filed at 04/17/16 0900  Gross per 24 hour  Intake             1260 ml  Output             2150 ml  Net             -890 ml   Filed Weights   04/15/16 0615 04/16/16 0519 04/17/16 0408  Weight: 201 lb 15.1 oz (91.6 kg) 199 lb 4.8 oz (90.4 kg) 198 lb 14.4 oz (90.2 kg)    Physical Exam    GEN: Well nourished, well developed, in no acute distress.  HEENT: Grossly normal.  Neck: Supple, no JVD, carotid bruits, or masses. Cardiac: RRR, soft SEM LSB rubs, or gallops. No clubbing, cyanosis, edema.  Radials/DP/PT 2+ and equal bilaterally.  Respiratory:  Respirations regular and unlabored, clear to auscultation bilaterally. GI: Soft, nontender, nondistended, BS + x 4. MS: no deformity or atrophy. Skin: warm and dry, no rash. Neuro:  Strength and sensation are intact.  Psych: AAOx3.  Normal affect.  Labs    CBC  Recent Labs  04/16/16 0236 04/17/16 0326  WBC 9.3 7.8  HGB 9.8* 9.5*  HCT 30.1* 29.7*  MCV 84.3 85.6  PLT 234 141   Basic Metabolic Panel  Recent Labs  04/16/16 0236 04/17/16 0326  NA 135 133*  K 4.7 4.9  CL 102 103  CO2 23 22  GLUCOSE 98 204*  BUN 24* 28*  CREATININE 2.02* 2.49*  CALCIUM 9.5 9.4     Telemetry    NSR - Personally Reviewed  ECG    NSR, lateral TWI- Personally Reviewed  Radiology    PCXR 04/14/16  PORTABLE CHEST 1 VIEW  COMPARISON:  04/13/2016  FINDINGS: Cardiac shadow remains enlarged. Postoperative changes are again seen. The Swan-Ganz catheter has been removed. Right jugular sheath remains. A mediastinal drain and pericardial drain have been removed as well. No acute bony abnormality is seen. Mild right upper lobe atelectatic changes are seen left basilar atelectasis remains.  IMPRESSION: Interval removal of tubes and lines. No pneumothorax is seen.  Mild right upper lobe atelectasis is seen. Persistent left basilar atelectasis is noted  Cardiac Studies   Echo 04/09/16 Study Conclusions  - Left ventricle: The cavity size was normal. Wall thickness was   increased in a pattern of mild LVH. Moderate septal hypertrophy. Systolic function was vigorous. The estimated ejection fraction was in the range of 65% to 70%.  Wall motion was normal; there were no regional wall motion abnormalities.  Doppler parameters are consistent with abnormal left ventricular relaxation (grade 1 diastolic dysfunction).  Doppler parameters are consistent with high ventricular filling pressure. - Mitral valve: Calcified annulus.  Patient Profile     60 y/o female with a history of renal disease, s/p renal transplant in 2011, CRI-3 post transplant, IDDM, and HTN- admitted 04/07/16 with a NSTEMI. 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 LVF was normal by echo. RCA PCI was unsuccessful.  She underwent CABG x 3 with SVG-OM1-OM2 and SVG-PDA 04/12/16. Post op she has had acute on chronic renal insufficiency.   Assessment & Plan    NSTEMI- 04/07/16 -pk Troponin 3.71. LVF normal by echo.  CABG x 3 04/12/16- SVG to OMs and PDA, native LAD 40%.  H/O renal transplant- at [facility] in 2011 with acute on chronic renal insufficiency post CABG, renal following.   IDDM- on sliding scale, BS stable  Essential HTN- B/P controlled  Plan: F/U with Dr Cia Garretson's office to be arranged.   Signed, Kerin Ransom, PA-C  04/17/2016, 11:50 AM   History and all data above reviewed.  Patient examined.  I agree with the findings as above. The patient looks like she is doing well.  No chest pain.  No SOB.   The patient exam reveals COR:RRR  ,  Lungs: Clear  ,  Abd: Positive bowel sounds, no rebound no guarding, Ext Trace edema  .  All available labs, radiology testing, previous records reviewed. Agree with documented assessment and  plan. CAD:  No change in meds.  We will arrange follow up when discharged.  No suggested change in meds.   Jeneen Rinks Georgana Romain  3:56 PM  04/17/2016

## 2016-04-17 NOTE — Progress Notes (Addendum)
[address]       [place] [id number]             [phone]      5 Days Post-Op Procedure(s) (LRB): CORONARY ARTERY BYPASS GRAFTING (CABG) times three using left saphenous vein harvested endoscopically (N/A) TRANSESOPHAGEAL ECHOCARDIOGRAM (TEE) (N/A) Subjective: Feels ok, no BM yet  Objective: Vital signs in last 24 hours: Temp:  [98.3 F (36.8 C)-98.7 F (37.1 C)] 98.6 F (37 C) (11/08 0408) Pulse Rate:  [78-89] 78 (11/08 0408) Cardiac Rhythm: Normal sinus rhythm (11/07 2015) Resp:  [18] 18 (11/08 0408) BP: (147-162)/(67-76) 147/76 (11/08 0408) SpO2:  [95 %-96 %] 95 % (11/08 0408) Weight:  [198 lb 14.4 oz (90.2 kg)] 198 lb 14.4 oz (90.2 kg) (11/08 0408)  Hemodynamic parameters for last 24 hours:    Intake/Output from previous day: 11/07 0701 - 11/08 0700 In: 1020 [P.O.:1020] Out: 1500 [Urine:1500] Intake/Output this shift: No intake/output data recorded.  General appearance: alert, cooperative and no distress Heart: regular rate and rhythm and soft systolic murmur Lungs: clear to auscultation bilaterally Abdomen: benign Extremities: min edema Wound: healing well , does have small nonpulsatile hematoma right femoral region from cath  Lab Results:  Recent Labs  04/16/16 0236 04/17/16 0326  WBC 9.3 7.8  HGB 9.8* 9.5*  HCT 30.1* 29.7*  PLT 234 316   BMET:  Recent Labs  04/16/16 0236 04/17/16 0326  NA 135 133*  K 4.7 4.9  CL 102 103  CO2 23 22  GLUCOSE 98 204*  BUN 24* 28*  CREATININE 2.02* 2.49*  CALCIUM 9.5 9.4    PT/INR: No results for input(s): LABPROT, INR in the last 72 hours. ABG    Component Value Date/Time   PHART 7.392 04/13/2016 0528   HCO3 21.5 04/13/2016 0528   TCO2 23 04/13/2016 1707   ACIDBASEDEF 3.0 (H) 04/13/2016 0528   O2SAT 99.0 04/13/2016 0528   CBG (last 3)   Recent Labs  04/16/16 1606 04/16/16 2139 04/17/16 0616  GLUCAP 216* 97 154*    Meds Scheduled Meds: . amLODipine  10 mg Oral  Daily  . aspirin EC  81 mg Oral Daily  . atorvastatin  40 mg Oral QHS  . chlorhexidine gluconate (MEDLINE KIT)  15 mL Mouth Rinse BID  . clopidogrel  75 mg Oral Daily  . docusate sodium  200 mg Oral Daily  . dorzolamide-timolol  1 drop Right Eye BID  . enoxaparin (LOVENOX) injection  40 mg Subcutaneous QHS  . famotidine  20 mg Oral BID  . insulin aspart  0-24 Units Subcutaneous TID AC & HS  . insulin detemir  20 Units Subcutaneous Daily  . mouth rinse  15 mL Mouth Rinse QID  . metoCLOPramide  5 mg Oral TID AC  . metoprolol tartrate  12.5 mg Oral BID  . mycophenolate  360 mg Oral BID  . predniSONE  5 mg Oral Q breakfast  . sodium chloride flush  3 mL Intravenous Q12H  . sulfamethoxazole-trimethoprim  1 tablet Oral Q M,W,F  . tacrolimus  2 mg Oral QHS  . tacrolimus  3 mg Oral Daily   Continuous Infusions: PRN Meds:.sodium chloride, acetaminophen, bisacodyl **OR** bisacodyl, lactulose, ondansetron **OR** ondansetron (ZOFRAN) IV, oxyCODONE-acetaminophen, simethicone, sodium chloride flush, traMADol  Xrays No results found.  Assessment/Plan: S/P Procedure(s) (LRB): CORONARY ARTERY BYPASS GRAFTING (CABG) times three using left saphenous vein harvested endoscopically (N/A) TRANSESOPHAGEAL ECHOCARDIOGRAM (TEE) (N/A)  1 creat conts to rise- nephrology  is managing renal issues 2 will try suppository for constipation 3 conts with variable sugars, high at times- he po intake is also variable 4 H/H a bit lower- monitor 5 push rehab and routine pulm toilet  LOS: 10 days    GOLD,WAYNE E 04/17/2016   Chart reviewed, patient examined, agree with above. She looks good. Still trying to get her bowels working. Creat up a little higher. Nephrology is following.

## 2016-04-17 NOTE — Progress Notes (Signed)
Pt ambulating hall with daughter using RW.  No tele alarms.  Will con't plan of care.

## 2016-04-17 NOTE — Progress Notes (Signed)
Pt oriented x's 4 however with paranoid behavior, feeling that she is being lied to or information being kept from her.  Pt removed her tele.  Tele replaced and attempted to address her concerns, unsuccessful.  Will con't plan of care as able.

## 2016-04-18 LAB — BASIC METABOLIC PANEL
Anion gap: 8 (ref 5–15)
BUN: 26 mg/dL — AB (ref 6–20)
CALCIUM: 9.3 mg/dL (ref 8.9–10.3)
CHLORIDE: 105 mmol/L (ref 101–111)
CO2: 22 mmol/L (ref 22–32)
CREATININE: 2.13 mg/dL — AB (ref 0.44–1.00)
GFR calc Af Amer: 28 mL/min — ABNORMAL LOW (ref >60)
GFR, EST NON AFRICAN AMERICAN: 24 mL/min — AB (ref >60)
Glucose, Bld: 106 mg/dL — ABNORMAL HIGH (ref 65–99)
Potassium: 4.4 mmol/L (ref 3.5–5.1)
SODIUM: 135 mmol/L (ref 135–145)

## 2016-04-18 LAB — CBC
HCT: 29.1 % — ABNORMAL LOW (ref 36.0–46.0)
Hemoglobin: 9.3 g/dL — ABNORMAL LOW (ref 12.0–15.0)
MCH: 27.1 pg (ref 26.0–34.0)
MCHC: 32 g/dL (ref 30.0–36.0)
MCV: 84.8 fL (ref 78.0–100.0)
Platelets: 347 10*3/uL (ref 150–400)
RBC: 3.43 MIL/uL — ABNORMAL LOW (ref 3.87–5.11)
RDW: 14.1 % (ref 11.5–15.5)
WBC: 6 10*3/uL (ref 4.0–10.5)

## 2016-04-18 LAB — GLUCOSE, CAPILLARY
GLUCOSE-CAPILLARY: 269 mg/dL — AB (ref 65–99)
GLUCOSE-CAPILLARY: 74 mg/dL (ref 65–99)
GLUCOSE-CAPILLARY: 109 mg/dL — AB (ref 65–99)
Glucose-Capillary: 97 mg/dL (ref 65–99)

## 2016-04-18 MED ORDER — FLEET ENEMA 7-19 GM/118ML RE ENEM
1.0000 | ENEMA | Freq: Once | RECTAL | Status: DC
  Filled 2016-04-18: qty 1

## 2016-04-18 MED ORDER — METOPROLOL TARTRATE 50 MG PO TABS
50.0000 mg | ORAL_TABLET | Freq: Two times a day (BID) | ORAL | Status: DC
  Administered 2016-04-18 – 2016-04-19 (×2): 50 mg via ORAL
  Filled 2016-04-18 (×2): qty 1

## 2016-04-18 NOTE — Progress Notes (Signed)
EPW wires removed per protocol. L EPW removed with ease. R EPW met some resistance, Wayne Gold removed. VSS. Pt remained on bedrest for one hour.   Fritz Pickerel, RN

## 2016-04-18 NOTE — Discharge Instructions (Signed)
Endoscopic Saphenous Vein Harvesting, Care After °Refer to this sheet in the next few weeks. These instructions provide you with information on caring for yourself after your procedure. Your health care provider may also give you more specific instructions. Your treatment has been planned according to current medical practices, but problems sometimes occur. Call your health care provider if you have any problems or questions after your procedure. °HOME CARE INSTRUCTIONS °Medicine °· Take whatever pain medicine your surgeon prescribes. Follow the directions carefully. Do not take over-the-counter pain medicine unless your surgeon says it is okay. Some pain medicine can cause bleeding problems for several weeks after surgery. °· Follow your surgeon's instructions about driving. You will probably not be permitted to drive after heart surgery. °· Take any medicines your surgeon prescribes. Any medicines you took before your heart surgery should be checked with your health care provider before you start taking them again. °Wound care °· If your surgeon has prescribed an elastic bandage or stocking, ask how long you should wear it. °· Check the area around your surgical cuts (incisions) whenever your bandages (dressings) are changed. Look for any redness or swelling. °· You will need to return to have the stitches (sutures) or staples taken out. Ask your surgeon when to do that. °· Ask your surgeon when you can shower or bathe. °Activity °· Try to keep your legs raised when you are sitting. °· Do any exercises your health care providers have given you. These may include deep breathing exercises, coughing, walking, or other exercises. °SEEK MEDICAL CARE IF: °· You have any questions about your medicines. °· You have more leg pain, especially if your pain medicine stops working. °· New or growing bruises develop on your leg. °· Your leg swells, feels tight, or becomes red. °· You have numbness in your leg. °SEEK IMMEDIATE  MEDICAL CARE IF: °· Your pain gets much worse. °· Blood or fluid leaks from any of the incisions. °· Your incisions become warm, swollen, or red. °· You have chest pain. °· You have trouble breathing. °· You have a fever. °· You have more pain near your leg incision. °MAKE SURE YOU: °· Understand these instructions. °· Will watch your condition. °· Will get help right away if you are not doing well or get worse. °  °This information is not intended to replace advice given to you by your health care provider. Make sure you discuss any questions you have with your health care provider. °  °Document Released: 02/06/2011 Document Revised: 06/17/2014 Document Reviewed: 02/06/2011 °Elsevier Interactive Patient Education ©2016 [employer] °Coronary Artery Bypass Grafting, Care After °These instructions give you information on caring for yourself after your procedure. Your doctor may also give you more specific instructions. Call your doctor if you have any problems or questions after your procedure.  °HOME CARE °· Only take medicine as told by your doctor. Take medicines exactly as told. Do not stop taking medicines or start any new medicines without talking to your doctor first. °· Take your pulse as told by your doctor. °· Do deep breathing as told by your doctor. Use your breathing device (incentive spirometer), if given, to practice deep breathing several times a day. Support your chest with a pillow or your arms when you take deep breaths or cough. °· Keep the area clean, dry, and protected where the surgery cuts (incisions) were made. Remove bandages (dressings) only as told by your doctor. If strips were applied to surgical area, do not take   them off. They fall off on their own. °· Check the surgery area daily for puffiness (swelling), redness, or leaking fluid. °· If surgery cuts were made in your legs: °· Avoid crossing your legs. °· Avoid sitting for long periods of time. Change positions every 30  minutes. °· Raise your legs when you are sitting. Place them on pillows. °· Wear stockings that help keep blood clots from forming in your legs (compression stockings). °· Only take sponge baths until your doctor says it is okay to take showers. Pat the surgery area dry. Do not rub the surgery area with a washcloth or towel. Do not bathe, swim, or use a hot tub until your doctor says it is okay. °· Eat foods that are high in fiber. These include raw fruits and vegetables, whole grains, beans, and nuts. Choose lean meats. Avoid canned, processed, and fried foods. °· Drink enough fluids to keep your pee (urine) clear or pale yellow. °· Weigh yourself every day. °· Rest and limit activity as told by your doctor. You may be told to: °· Stop any activity if you have chest pain, shortness of breath, changes in heartbeat, or dizziness. Get help right away if this happens. °· Move around often for short amounts of time or take short walks as told by your doctor. Gradually become more active. You may need help to strengthen your muscles and build endurance. °· Avoid lifting, pushing, or pulling anything heavier than 10 pounds (4.5 kg) for at least 6 weeks after surgery. °· Do not drive until your doctor says it is okay. °· Ask your doctor when you can go back to work. °· Ask your doctor when you can begin sexual activity again. °· Follow up with your doctor as told. °GET HELP IF: °· You have puffiness, redness, more pain, or fluid draining from the incision site. °· You have a fever. °· You have puffiness in your ankles or legs. °· You have pain in your legs. °· You gain 2 or more pounds (0.9 kg) a day. °· You feel sick to your stomach (nauseous) or throw up (vomit). °· You have watery poop (diarrhea). °GET HELP RIGHT AWAY IF: °· You have chest pain that goes to your jaw or arms. °· You have shortness of breath. °· You have a fast or irregular heartbeat. °· You notice a "clicking" in your breastbone when you move. °· You  have numbness or weakness in your arms or legs. °· You feel dizzy or light-headed. °MAKE SURE YOU: °· Understand these instructions. °· Will watch your condition. °· Will get help right away if you are not doing well or get worse. °  °This information is not intended to replace advice given to you by your health care provider. Make sure you discuss any questions you have with your health care provider. °  °Document Released: 06/01/2013 Document Reviewed: 06/01/2013 °Elsevier Interactive Patient Education ©2016 [employer] ° °

## 2016-04-18 NOTE — Progress Notes (Signed)
Pt asked when the doctor would be in this morning. Explained to pt that they'll be in after 7. She also asked to be taken to her room. Pt was already standing in her room. She also stated "why is all this stuff in here"? Then pointed to her bed. I told her that her bed was for her to sleep in. She pointed to the skin. I told her it was for her to wash her hands there. She pointed to the mirror above the sink. I told her it was a mirror she can look at herself, and the napkin dispenser dispenses napkins and the soap dispenser allows her to clean her hands. She pointed to the bedside commode. I told her it was there for her to go to the bathroom if she felt that she could not make it to the bathroom.  She stated that she was in pain but refused any medications when I offered it. I finally got pt to sit on bed and watch TV.

## 2016-04-18 NOTE — Progress Notes (Signed)
April Pena Progress Note   Progress/ReConsultation Note  April Pena is a 60 y.o. year-old female with hx of DM2, HTN, CAD, MI, glaucoma and ESRD with functional renal transplant (was on HD started 2004/08/06, got [place] deceased donor renal transplant to right pelvis at [facility] in Aug 06, 2010, per pt baseline creat is around "2") and is on prograf/myfortic/prednisone. Transplant performed for ESRD thought to be 2/2 diabetes and had previously been dependent on eritoneal dialysis. Primary Nephrologist is Dr. Joesph July in [facility].   Admitted Oct 29th with refractory CP and found to have severe 3 vessel disease on cath 10/30. Underwent CABG on 11/3. Creatinine has fluctuated before and after surgery (2.3 post-cath, 1.65- 2.0 postop). Nephrology was consulted 11/4 for recommendations regarding medications for CKD w renal transplant and to r/o other problems related to renal function. Signed off for stable SCr and good UOP on 11/5. Since that time SCr has risen from 1.68 to 1.80 to 2.02, so Nephrology was asked to re-consult. She is on her home doses of prograf, myfortic, prednisone and bactrim. No nephrotoxins. Soft blood pressures 11/3 post op, past 24 hours -> hypertension since.   Subjective:   Has not had a full BM, only small liquid BM passed once yesterday. Had chest wall pain overnight. Still not with full appetite but drinking well. However, she did eat almost all of her breakfast.    Objective:   BP (!) 156/71 (BP Location: Right Arm)   Pulse 88   Temp 98.7 F (37.1 C) (Oral)   Resp 20   Ht _0  (1.6 m)   Wt 198 lb 11.2 oz (90.1 kg)   SpO2 100%   BMI 35.20 kg/m   Intake/Output Summary (Last 24 hours) at 04/18/16 0746 Last data filed at 04/17/16 1755  Gross per 24 hour  Intake           693.33 ml  Output             1650 ml  Net          -956.67 ml   Weight change: -3.2 oz (-0.091 kg)   Net negative 3.9 L since admission.   Physical Exam: Gen: Obese female, pleasant, sitting  in chair; L eye opaque cornea HENT: MMM  CVS: RRR, no murmurs, no JVD, no LE edema; sternotomy scar clean and dry Resp: CTAB, no increased WOB, on RA Abd: Distended but soft, no TTP, no rebound or guarding, + BS Ext: No edema, moves all spontaneously Neuro: AOx3   Recent Labs Lab 04/12/16 0459  04/13/16 0350 04/13/16 1700 04/13/16 1707 04/14/16 0500 04/15/16 0313 04/16/16 0236 04/17/16 0326 04/18/16 0551  NA 138  < > 136  --  136 134* 133* 135 133* 135  K 4.4  < > 4.7  --  4.6 4.7 4.2 4.7 4.9 4.4  CL 108  < > 108  --  103 102 102 102 103 105  CO2 24  --  23  --   --  _1 GLUCOSE 122*  < > 125*  --  140* 148* 65 98 204* 106*  BUN 32*  < > 23*  --  21* 18 20 24* 28* 26*  CREATININE 2.07*  < > 1.90* 1.84* 1.80* 1.68* 1.80* 2.02* 2.49* 2.13*  CALCIUM 9.5  --  8.8*  --   --  9.1 8.9 9.5 9.4 9.3  < > = values in this interval not displayed.   Recent Labs Lab  04/15/16 0313 04/16/16 0236 04/17/16 0326 04/18/16 0551  WBC 8.8 9.3 7.8 6.0  HGB 10.0* 9.8* 9.5* 9.3*  HCT 30.7* 30.1* 29.7* 29.1*  MCV 84.6 84.3 85.6 84.8  PLT 181 234 316 347    Medications:    . amLODipine  10 mg Oral Daily  . aspirin EC  81 mg Oral Daily  . atorvastatin  40 mg Oral QHS  . chlorhexidine gluconate (MEDLINE KIT)  15 mL Mouth Rinse BID  . clopidogrel  75 mg Oral Daily  . docusate sodium  200 mg Oral Daily  . dorzolamide-timolol  1 drop Right Eye BID  . enoxaparin (LOVENOX) injection  30 mg Subcutaneous QHS  . famotidine  20 mg Oral Daily  . insulin aspart  0-24 Units Subcutaneous TID AC & HS  . insulin detemir  20 Units Subcutaneous Daily  . mouth rinse  15 mL Mouth Rinse QID  . metoCLOPramide  5 mg Oral TID AC  . metoprolol tartrate  25 mg Oral BID  . mycophenolate  360 mg Oral BID  . predniSONE  5 mg Oral Q breakfast  . sodium chloride flush  3 mL Intravenous Q12H  . sodium phosphate  1 enema Rectal Once  . sulfamethoxazole-trimethoprim  1 tablet Oral Q M,W,F  . tacrolimus   2 mg Oral QHS  . tacrolimus  3 mg Oral Q breakfast   Background: April Pena is a 60 y.o. year-old female with hx of DM2, HTN, CAD, MI, glaucoma and ESRD with functional renal transplant (was on HD started 07-23-04, got [place] deceased donor renal transplant to right pelvisat [facility] in Jul 23, 2010, per pt baseline creat is around "2") and is on prograf/myfortic/prednisone. Transplant performed for ESRD thought to be 2/2 diabetes and had previously been dependent peritoneal dialysis. Primary Nephrologist is Dr. Joesph July in [facility].   Admitted Oct 29th with refractory CP and found to have severe 3 vessel disease on cath 10/30. Underwent CABG on 11/3. Creatinine has fluctuated before and after surgery (2.3 post-cath, 1.65- 2.0 postop). Nephrology was consulted 11/4 for recommendations regarding medications for CKD w renal transplant and to r/o other problems related to renal function. Signed off for stable SCr and good UOP on 11/5. Since that time SCr has risen from 1.68 to 1.80 to 2.02, so Nephrology was asked to re-consult.   Per Care Everywhere, SCr has ranged from 1.75 to 2.02 over the last 6 months.   Assessment/ Plan:   1. 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 UOP adequate at 1.65 L. Net negative yesterday and over course of hospitalization. Per patient, urinating normally. Decreased PO intake due to nausea but tolerating fluids. Continue home medications of myfortic, prograf, prednisone and prophylactic bactrim. Says she is also on bicarb at home but CO2 > 20 during admission, so do not feel this needs to be restarted at this time.Tacrolimus level in process. Transplanted kidney not evaluated with U/S yesterday, 11/8. Would try again if kidney function worsens. Will hold fluids today and continue to monitor creatinine.   2. S/p CABG: Management per CTS.  3. T2DM: Last Hgb A1c 8.3 on 04/12/16.   4. HTN: Systolic BPs in 010U/725D. Restarted norvasc 10 mg 11/7. Metoprolol  increased to 25 mg BID 11/8. On metoprolol succinate 100 mg, imdur 60 mg, and norvasc 10 mg at home -- though note from [facility] mentions only norvasc and labetolol instead. Appears that imdur was stopped for low BPs on 11/3. Fluid status  net negative 3.9 L since admission and weight stable. Would continue to up-titrate beta blocker to home dose.   5. Constipation: To have enema today, 11/9  Olene Floss, MD [place], PGY-2 04/18/2016, 7:46 AM   I have seen and examined this patient and agree with plan and assessment in the above note with renal recommendations/interventions as highlighted.  With IVF creatinine is improving. Unfortunately [place] did not image her transplanted kidney (and she agrees to repeat US only if creatinine worsens again and "not in a bed"). Tweaking BP meds. Will continue to follow.  Dujuan Stankowski B,MD 04/18/2016 9:59 AM

## 2016-04-18 NOTE — Progress Notes (Signed)
Inpatient Diabetes Program Recommendations  AACE/ADA: New Consensus Statement on Inpatient Glycemic Control (2015)  Target Ranges:  Prepandial:   less than 140 mg/dL      Peak postprandial:   less than 180 mg/dL (1-2 hours)      Critically ill patients:  140 - 180 mg/dL   Lab Results  Component Value Date   GLUCAP 97 04/18/2016   HGBA1C 8.3 (H) 04/12/2016    Review of Glycemic Control Results for April Pena, April Pena (MRN [id number]) as of 04/18/2016 09:39  Ref. Range 04/17/2016 06:16 04/17/2016 11:29 04/17/2016 16:04 04/17/2016 21:02 04/18/2016 06:15  Glucose-Capillary Latest Ref Range: 65 - 99 mg/dL 154 (H) 137 (H) 218 (H) 211 (H) 97   Diabetes history: DM2 Outpatient Diabetes medications: Novolog 10-30 units TIDAC, Levemir 10-20 units QHS Current orders for Inpatient glycemic control: Levemir 20 units qd+Novolog correction 0-24 units tid& hs  Inpatient Diabetes Program Recommendations:    Please consider  -Decrease correction to Novolog 0-15 units tid and 0-5 units hs -Add Novolog meal coverage 3-4 units tid if eats 50%  Thank you, Bethena Roys E. Zuhayr Deeney, RN, MSN, CDE Inpatient Glycemic Control Team Team Pager [phone] (8am-5pm) 04/18/2016 9:40 AM

## 2016-04-18 NOTE — Progress Notes (Addendum)
[address]       [place] [address]             [phone]      6 Days Post-Op Procedure(s) (LRB): CORONARY ARTERY BYPASS GRAFTING (CABG) times three using left saphenous vein harvested endoscopically (N/A) TRANSESOPHAGEAL ECHOCARDIOGRAM (TEE) (N/A) Subjective: Had some issues feeling paranoid yesterday/last night, feeling much better currently. Some liquid stool- minimal  Objective: Vital signs in last 24 hours: Temp:  [97.5 F (36.4 C)-98.7 F (37.1 C)] 98.7 F (37.1 C) (11/09 0617) Pulse Rate:  [81-91] 88 (11/09 0617) Cardiac Rhythm: Normal sinus rhythm (11/09 0009) Resp:  [18-20] 20 (11/09 0617) BP: (156-177)/(71-91) 156/71 (11/09 0617) SpO2:  [96 %-100 %] 100 % (11/09 0617) Weight:  [198 lb 11.2 oz (90.1 kg)] 198 lb 11.2 oz (90.1 kg) (11/09 0617)  Hemodynamic parameters for last 24 hours:    Intake/Output from previous day: 11/08 0701 - 11/09 0700 In: 933.3 [P.O.:840; I.V.:93.3] Out: 1650 [Urine:1650] Intake/Output this shift: No intake/output data recorded.  General appearance: alert, cooperative and no distress Heart: regular rate and rhythm and soft systolic murmur Lungs: slightly coarse Abdomen: benign Extremities: minimal edema Wound: incis healing well  Lab Results:  Recent Labs  04/17/16 0326 04/18/16 0551  WBC 7.8 6.0  HGB 9.5* 9.3*  HCT 29.7* 29.1*  PLT 316 347   BMET:  Recent Labs  04/17/16 0326 04/18/16 0551  NA 133* 135  K 4.9 4.4  CL 103 105  CO2 22 22  GLUCOSE 204* 106*  BUN 28* 26*  CREATININE 2.49* 2.13*  CALCIUM 9.4 9.3    PT/INR: No results for input(s): LABPROT, INR in the last 72 hours. ABG    Component Value Date/Time   PHART 7.392 04/13/2016 0528   HCO3 21.5 04/13/2016 0528   TCO2 23 04/13/2016 1707   ACIDBASEDEF 3.0 (H) 04/13/2016 0528   O2SAT 99.0 04/13/2016 0528   CBG (last 3)   Recent Labs  04/17/16 1604 04/17/16 2102 04/18/16 0615  GLUCAP 218* 211* 97    Meds Scheduled  Meds: . amLODipine  10 mg Oral Daily  . aspirin EC  81 mg Oral Daily  . atorvastatin  40 mg Oral QHS  . chlorhexidine gluconate (MEDLINE KIT)  15 mL Mouth Rinse BID  . clopidogrel  75 mg Oral Daily  . docusate sodium  200 mg Oral Daily  . dorzolamide-timolol  1 drop Right Eye BID  . enoxaparin (LOVENOX) injection  30 mg Subcutaneous QHS  . famotidine  20 mg Oral Daily  . insulin aspart  0-24 Units Subcutaneous TID AC & HS  . insulin detemir  20 Units Subcutaneous Daily  . mouth rinse  15 mL Mouth Rinse QID  . metoCLOPramide  5 mg Oral TID AC  . metoprolol tartrate  25 mg Oral BID  . mycophenolate  360 mg Oral BID  . predniSONE  5 mg Oral Q breakfast  . sodium chloride flush  3 mL Intravenous Q12H  . sulfamethoxazole-trimethoprim  1 tablet Oral Q M,W,F  . tacrolimus  2 mg Oral QHS  . tacrolimus  3 mg Oral Q breakfast   Continuous Infusions: . sodium chloride 50 mL/hr at 04/17/16 1607   PRN Meds:.sodium chloride, acetaminophen, bisacodyl **OR** bisacodyl, lactulose, ondansetron **OR** ondansetron (ZOFRAN) IV, oxyCODONE-acetaminophen, simethicone, sodium chloride flush, traMADol  Xrays US Renal Transplant W/doppler  Result Date: 04/17/2016 CLINICAL DATA:  Status post CABG with renal insufficiency. History of prior renal transplantation. EXAM:  RENAL / URINARY TRACT ULTRASOUND COMPLETE COMPARISON:  None. FINDINGS: Right Kidney: Length: 7.0 cm. Shrunken echogenic kidney without obstruction. No focal lesion identified. Left Kidney: Length: 7.4 cm. Shrunken echogenic kidney without hydronephrosis or focal lesion. Bladder: Appears normal for degree of bladder distention. After initial ultrasound of the native kidneys, the patient was brought back for imaging of her transplanted kidney as it was initially not recognized by the sonographer that the patient had a history of renal transplantation. When she was brought back down, the patient adamantly refused to undergo any imaging of her transplant  kidney. This has been documented by Ultrasound staff. IMPRESSION: 1. Small echogenic native kidneys consistent with end-stage renal disease. No evidence of obstruction or mass of the native kidneys. 2. The patient was brought back to the department for imaging of her transplanted kidney. She adamantly refused any imaging of the transplant and no further ultrasound imaging was performed. Electronically Signed   By: Aletta Edouard M.D.   On: 04/17/2016 17:26    Assessment/Plan: S/P Procedure(s) (LRB): CORONARY ARTERY BYPASS GRAFTING (CABG) times three using left saphenous vein harvested endoscopically (N/A) TRANSESOPHAGEAL ECHOCARDIOGRAM (TEE) (N/A)   1 doing well overall 2 will try enema today 3 nephrology following- on IVF, creat improved to 2.13 4  sugars remain variable 5 d/c epw's 6 may need additional rx for HTN 7 poss d/c in next 24-48 hours    LOS: 11 days    GOLD,WAYNE E 04/18/2016   Chart reviewed, patient examined, agree with above. Creat down today after fluids. Only a small amount of liquid stool. Plan home when bowels working and nephrology comfortable with her kidney function.

## 2016-04-18 NOTE — Progress Notes (Addendum)
Pt is more paranoid tonight and complaining of pain but refuses pain medication or any medication until she talks to a doctor because she feels like we're not giving her the correct care that she wants. She also requested that I leave the pain medication there for her so she can take it when she wants. She stated that she has been ringing the call bell and no one has been coming to see her when both myself and the NA answered her call bell. She is refusing certain medications and care.  Reorienting pt back present problem and reason for being in hospital.

## 2016-04-18 NOTE — Discharge Summary (Signed)
Physician Discharge Summary  Patient ID: April Pena MRN: [id number] DOB/AGE: 14-Jul-1955 60 y.o.  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 S/p cadaver renal transplant 2011 04/17/2016  . Chronic renal disease, stage III s/p transplant 04/17/2016  . Acute on chronic renal insufficiency post CABG 04/17/2016  . S/P CABG x 3 04/12/16 04/12/2016  . Hypertensive heart disease 04/11/2016  . Hyperlipidemia 04/11/2016  . Groin hematoma, initial encounter 04/11/2016  . Diabetes mellitus type 2 in obese ([place]) 04/11/2016  . NSTEMI (non-ST elevated myocardial infarction) ([place]) 04/07/2016  . Low back pain radiating to right leg 04/04/2011    HPI: at time of admission The patient presents with chest pain.  She came to [facility] ED.   She has a history of renal failure with transplant.    She presents today with chest pain that started at about 4:30 this morning.  She reports that it was like her previous heart attacks.  She has been treated at [facility].  However, I cannot find any records.  She reports that she has had two caths before.  Some years ago she said she had one and she accurately describes a femoral cath.   She reports that she had vessels too small to treat with intervention.  She reports a cath 8 months ago that was radial.  She said that this was so painful in her chest that she is not sure that they were able to complete this.  She doesn't recall what they said but she says that she has never had a stent.   She is very limited in her activities.  She walks with a walker secondary to joint pain and leg weakness.  She has chronic chest pain and takes NTG at home and pain usually goes away.  However, the pain has been happening with less activity.  Today it did not go away after 4 NTG.  She came to the [facility] ED and had T wave inversion in the high lateral leads and ST elevation in the anterior leads that looked like repolarization.  There were no old EKGs to compare.  She was made pain free with morphine and now has very mild residual discomfort.  She reports that the pain was similar to her previous angina.  It was 8/10.  There was radiation around the left side to the back.  Of note her troponin was elevated.  She was felt to require admission for further evaluation and treatment.   Discharged Condition: good  Hospital Course: The patient was admitted through the emergency department for further evaluation and treatment. She was seen in cardiology consultation.She did rule in for a non-STEMI.She was felt to require cardiac catheterization.This was done on 04/08/2016 by Dr. Angelena Form. She was found to have severe multivessel coronary artery disease. Cardiothoracic surgical consultation was obtained with Dr. Cyndia Bent who evaluated the patient and her studies and agreed with recommendations to proceed with CABG.The patient was aggressively medically managed and in particular hypertension.She was felt to be  medically maximized for surgery. On 04/12/2016 she was taken the operating room where she underwent the below described procedure.  Postoperative hospital course: The patient was initially a little slow to wake up but was able to be extubated at 4 AM using standard protocols. She has remained hemodynamically stable in sinus rhythm. Nephrology has assisted with postoperative management as she did have an acute exacerbation of her chronic stage III kidney dysfunction. Her creatinine is returning to the usual  range for her. It was last down to 2.13. She has had some significant difficulty with postoperative constipation but abdominal examinations remained quite stable. She has been treated with the usual measures in a progressive manner. Incisions are noted be healing well without evidence of infection. Blood sugars have been under adequate control although somewhat labile due to dietary intake being different than her usual at home. She will need close medical follow up regarding further management of her diabetes as well as surveillance of HGA1C. Oxygen has been weaned and she is maintaining good saturations on room air.She has had some postoperative hypertension and medications have been adjusted. Per Dr. Cyndia Bent, she is stable for discharge home today.   Consults: nephrology  Significant Diagnostic Studies: angiography: cardiac cath Renal ultrasound  Treatments: surgery:  04/12/2016  Surgeon:  Gaye Pollack, MD  First Assistant: Jadene Pierini, PA-C   Preoperative Diagnosis:  Severe multi-vessel coronary artery disease   Postoperative Diagnosis:  Same   Procedure: Median Sternotomy 1. Extracorporeal circulation 3.   Coronary artery bypass grafting x 3   Sequential SVG to OM3 and OM2  SVG to PDA   4.   Endoscopic vein harvest from the left leg   Anesthesia:  General Endotracheal  Discharge Exam: Blood pressure (!) 153/58, pulse 77, temperature 98.5 F (36.9 C), temperature source Oral, resp. rate 18, height 5\' 3"  (1.6 m), weight 196 lb 9.6 oz (89.2 kg), SpO2 100 %.  General appearance: alert, cooperative and no distress Heart: regular rate and rhythm Lungs: clear to auscultation bilaterally Abdomen: benign Extremities: minor edema Wound: incis healing well  Disposition: 01-Home or Self Care  Discharge Instructions    Amb Referral to Cardiac Rehabilitation    Complete by:  As directed    Referring to [place] Phase 2   Diagnosis:   CABG NSTEMI      CABG X ___:  3       Medication List    STOP taking these medications   isosorbide mononitrate 60 MG 24 hr tablet Commonly known as:  IMDUR   magnesium oxide 400 MG tablet Commonly known as:  MAG-OX   nitroGLYCERIN 0.4 MG SL tablet Commonly known as:  NITROSTAT     TAKE these medications   acetaminophen 500 MG tablet Commonly known as:  TYLENOL Take 500-1,000 mg by mouth every 6 (six) hours as needed (pain).   amLODipine 10 MG tablet Commonly known as:  NORVASC Take 10 mg by mouth daily.   aspirin EC 81 MG tablet Take 81 mg by mouth daily. What changed:  Another medication with the same name was removed. Continue taking this medication, and follow the directions you see here.   atorvastatin 40 MG tablet Commonly known as:  LIPITOR Take 1 tablet (40 mg total) by mouth at bedtime. What changed:  medication strength  how much to take   clopidogrel 75 MG tablet Commonly known as:  PLAVIX Take 75 mg by mouth daily.   dexlansoprazole 60 MG capsule Commonly known as:  DEXILANT Take 60 mg by mouth daily.   dorzolamide-timolol 22.3-6.8 MG/ML ophthalmic solution Commonly known as:  COSOPT Place 1 drop into both eyes 2 (two) times daily.   gabapentin 300 MG capsule Commonly known as:  NEURONTIN Take 1 capsule (300 mg total) by mouth daily.   Insulin Detemir 100 UNIT/ML Pen Commonly known as:  LEVEMIR FLEXTOUCH Inject 20 Units into the skin daily. What changed:  how much to take  when to take this  additional instructions   metoprolol succinate 100 MG 24 hr tablet Commonly known as:  TOPROL-XL Take 100 mg by mouth daily. Take with or immediately following a meal.   mycophenolate 180 MG EC tablet Commonly known as:  MYFORTIC Take 360 mg by mouth 2 (two) times daily.   NOVOLOG FLEXPEN 100 UNIT/ML FlexPen Generic drug:  insulin aspart Inject 10-30 Units into the skin 3 (three) times daily with meals. Based on CBG or carb count   oxyCODONE-acetaminophen  7.5-325 MG tablet Commonly known as:  PERCOCET Take one or two tablet every 4-6 hours PRN severe pain. What changed:  how much to take  how to take this  when to take this  additional instructions   prednisoLONE acetate 1 % ophthalmic suspension Commonly known as:  PRED FORTE Place 1 drop into the left eye 4 (four) times daily as needed (itching).   predniSONE 5 MG tablet Commonly known as:  DELTASONE Take 5 mg by mouth daily.   PROGRAF 1 MG capsule Generic drug:  tacrolimus Take 2-3 mg by mouth See admin instructions. Take 3 capsules (3 mg) by mouth every morning and 2 capsules (2 mg) at night   sodium bicarbonate 650 MG tablet Take 650 mg by mouth 2 (two) times daily.   sulfamethoxazole-trimethoprim 400-80 MG tablet Commonly known as:  BACTRIM,SEPTRA Take 1 tablet by mouth every Monday, Wednesday, and Friday.      Follow-up Information    Gaye Pollack, MD Follow up on 05/22/2016.   Specialty:  Cardiothoracic Surgery Why:  PA/LAT CXR to be taken (at [facility] which is in the same building as Dr. Vivi Martens office) on 12/13/201 at 12:30 pm;Appointment time is at 1:00 pm Contact information: [address] [place] [address] [phone]        [facility] [place] Follow up.   Specialty:  Cardiothoracic Surgery Why:  Nurse appointment for suture removal on November 17 at 10 AM. Contact information: [place], [place], [place] Follow up.   Specialty:  Internal Medicine Why:  Call for a follow up appointment regarding further diabetes management and surveillance of HGA1C 8.3 Contact information: [place] [address] [place], PA-C Follow up on 05/08/2016.   Specialties:  Cardiology, Radiology Why:  Appointment time is at 9:30 am Contact information: [place] [address] [phone]           Signed: Nani Skillern PA-C 04/19/2016, 1:35 PM

## 2016-04-18 NOTE — Progress Notes (Signed)
CARDIAC REHAB PHASE I   PRE:  Rate/Rhythm: 72 SR  BP:  Sitting: 104/50        SaO2: 97 RA  MODE:  Ambulation: 500 ft   POST:  Rate/Rhythm: 79 SR  BP:  Sitting: 144/63         SaO2: 98 RA  Pt ambulated 500 ft on RA, IV, rolling walker, assist x1, steady gait, tolerated well with no complaints other than soreness in her chest. Pt states she ambulated independently earlier this morning. Encouraged IS, additional ambulation x1 more today. Pt did states she lives alone, does not have 24 hour care for discharge. Pt to edge of bed per pt request after walk, call bell within reach. Will follow.   [id number] Lenna Sciara, RN, BSN 04/18/2016 11:52 AM

## 2016-04-19 LAB — BASIC METABOLIC PANEL
ANION GAP: 8 (ref 5–15)
BUN: 28 mg/dL — ABNORMAL HIGH (ref 6–20)
CHLORIDE: 108 mmol/L (ref 101–111)
CO2: 23 mmol/L (ref 22–32)
CREATININE: 2.13 mg/dL — AB (ref 0.44–1.00)
Calcium: 9.5 mg/dL (ref 8.9–10.3)
GFR calc non Af Amer: 24 mL/min — ABNORMAL LOW (ref >60)
GFR, EST AFRICAN AMERICAN: 28 mL/min — AB (ref >60)
Glucose, Bld: 106 mg/dL — ABNORMAL HIGH (ref 65–99)
POTASSIUM: 4.8 mmol/L (ref 3.5–5.1)
SODIUM: 139 mmol/L (ref 135–145)

## 2016-04-19 LAB — TACROLIMUS LEVEL: Tacrolimus (FK506) - LabCorp: 6.9 ng/mL (ref 2.0–20.0)

## 2016-04-19 LAB — CBC
HCT: 27.4 % — ABNORMAL LOW (ref 36.0–46.0)
HEMOGLOBIN: 8.8 g/dL — AB (ref 12.0–15.0)
MCH: 27.3 pg (ref 26.0–34.0)
MCHC: 32.1 g/dL (ref 30.0–36.0)
MCV: 85.1 fL (ref 78.0–100.0)
PLATELETS: 369 10*3/uL (ref 150–400)
RBC: 3.22 MIL/uL — AB (ref 3.87–5.11)
RDW: 14.1 % (ref 11.5–15.5)
WBC: 4.9 10*3/uL (ref 4.0–10.5)

## 2016-04-19 LAB — GLUCOSE, CAPILLARY
Glucose-Capillary: 110 mg/dL — ABNORMAL HIGH (ref 65–99)
Glucose-Capillary: 215 mg/dL — ABNORMAL HIGH (ref 65–99)

## 2016-04-19 MED ORDER — GABAPENTIN 300 MG PO CAPS
300.0000 mg | ORAL_CAPSULE | Freq: Every day | ORAL | Status: AC
Start: 2016-04-19 — End: 2029-12-27

## 2016-04-19 MED ORDER — OXYCODONE-ACETAMINOPHEN 7.5-325 MG PO TABS: ORAL_TABLET | ORAL | 0 refills | Status: DC

## 2016-04-19 MED ORDER — ATORVASTATIN CALCIUM 40 MG PO TABS: 40.0000 mg | ORAL_TABLET | Freq: Every day | ORAL | 1 refills | Status: DC

## 2016-04-19 MED ORDER — INSULIN DETEMIR 100 UNIT/ML FLEXPEN: 20.0000 [IU] | PEN_INJECTOR | Freq: Every day | SUBCUTANEOUS | 11 refills | Status: DC

## 2016-04-19 NOTE — Progress Notes (Addendum)
[facility] Progress Note   Subjective:   Had a better day yesterday with more pain medication. Had BM x 3. Appetite improved and still making her regular amount of urine.    Objective:   BP (!) 152/78 (BP Location: Right Arm)   Pulse 73   Temp 98.5 F (36.9 C) (Oral)   Resp 20   Ht 5' 3"  (1.6 m)   Wt 196 lb 9.6 oz (89.2 kg)   SpO2 99%   BMI 34.83 kg/m  No intake or output data in the 24 hours ending 04/19/16 0735 Weight change: -2 lb 1.6 oz (-0.953 kg)  Physical Exam: Gen: Obese female, very pleasant, sitting at edge of bed; L eye opaque cornea HENT: MMM  CVS: RRR, no murmurs, no JVD, no LE edema; sternotomy scar clean and dry Resp: CTAB, no increased WOB, on RA Abd: Distended but soft, no TTP, no rebound or guarding, + BS Ext: No edema, moves all spontaneously Neuro: AOx3   Recent Labs Lab 04/13/16 0350  04/13/16 1707 04/14/16 0500 04/15/16 0313 04/16/16 0236 04/17/16 0326 04/18/16 0551 04/19/16 0214  NA 136  --  136 134* 133* 135 133* 135 139  K 4.7  --  4.6 4.7 4.2 4.7 4.9 4.4 4.8  CL 108  --  103 102 102 102 103 105 108  CO2 23  --   --  23 23 23 22 22 23   GLUCOSE 125*  --  140* 148* 65 98 204* 106* 106*  BUN 23*  --  21* 18 20 24* 28* 26* 28*  CREATININE 1.90*  < > 1.80* 1.68* 1.80* 2.02* 2.49* 2.13* 2.13*  CALCIUM 8.8*  --   --  9.1 8.9 9.5 9.4 9.3 9.5  < > = values in this interval not displayed.   Recent Labs Lab 04/16/16 0236 04/17/16 0326 04/18/16 0551 04/19/16 0214  WBC 9.3 7.8 6.0 4.9  HGB 9.8* 9.5* 9.3* 8.8*  HCT 30.1* 29.7* 29.1* 27.4*  MCV 84.3 85.6 84.8 85.1  PLT 234 316 347 369    Medications:    . amLODipine  10 mg Oral Daily  . aspirin EC  81 mg Oral Daily  . atorvastatin  40 mg Oral QHS  . chlorhexidine gluconate (MEDLINE KIT)  15 mL Mouth Rinse BID  . clopidogrel  75 mg Oral Daily  . docusate sodium  200 mg Oral Daily  . dorzolamide-timolol  1 drop Right Eye BID  . enoxaparin (LOVENOX) injection  30 mg  Subcutaneous QHS  . famotidine  20 mg Oral Daily  . insulin aspart  0-24 Units Subcutaneous TID AC & HS  . insulin detemir  20 Units Subcutaneous Daily  . mouth rinse  15 mL Mouth Rinse QID  . metoCLOPramide  5 mg Oral TID AC  . metoprolol tartrate  50 mg Oral BID  . mycophenolate  360 mg Oral BID  . predniSONE  5 mg Oral Q breakfast  . sodium chloride flush  3 mL Intravenous Q12H  . sodium phosphate  1 enema Rectal Once  . sulfamethoxazole-trimethoprim  1 tablet Oral Q M,W,F  . tacrolimus  2 mg Oral QHS  . tacrolimus  3 mg Oral Q breakfast   Background: April Pena is a 60 y.o. year-old female with hx of DM2, HTN, CAD, MI, glaucoma and ESRD with functional renal transplant (was on HD started 08/06/2004, got [place] deceased donor renal transplant [facility] right pelvis[facility] in 08/06/2010, per pt baseline creat is around "2")  and is on prograf/myfortic/prednisone. Transplant performed for ESRD thought to be 2/2 diabetes and had previously been dependent peritoneal dialysis. Primary Nephrologist is Dr. Joesph July in [facility].   Admitted Oct 29th with refractory CP and found to have severe 3 vessel disease on cath 10/30. Underwent CABG on 11/3. Creatinine has fluctuated before and after surgery (2.3 post-cath, 1.65- 2.0 postop). Nephrology was consulted 11/4 for recommendations regarding medications for CKD w renal transplant and to r/o other problems related to renal function. Signed off for stable SCr and good UOP on 11/5. Since that time SCr has risen from 1.68 to 1.80 to 2.02, so Nephrology was asked to re-consult.   Per Care Everywhere, SCr has ranged from 1.75 to 2.02 over the last 6 months.   Assessment/ Plan:   1. 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 UOP adequate at 1.7 L. Per patient, urinating normally. Decreased PO intake due to nausea but tolerating fluids. Net negative 3.9 L. No longer hyponatremic. Continue home medications of myfortic, prograf,  prednisone and prophylactic bactrim. Says she is also on bicarb at home but CO2 > 20 during admission, so do not feel this needs to be restarted at this time.Tacrolimus level pending. Had wanted renal U/S of transplanted kidney but only native kidneys imaged 11/8. Will recommend U/S prior to discharge, as SCr lingering above normal baseline. Follow-up with regular nephrologist within a few days of d/c.   2. S/p CABG: Management per CTS.   3. T2DM: Last Hgb A1c 8.3 on 04/12/16.   4. HTN: Systolic BPs in 510-258N. Restarted norvasc 10 mg. Metoprolol increased to 50 mg BID 11/9, as on metoprolol succinate 100 mg, imdur 60 mg, and norvasc 10 mg at home -- though note from [place] mentions only norvasc and labetolol instead. Appears that imdur was stopped for low BPs on 11/3. Fluid status net negative 3.9 L since admission but weight down.   Olene Floss, MD [place], PGY-2 04/19/2016, 7:35 AM   I have seen and examined this patient and agree with plan and assessment in the above note with renal recommendations/intervention highlighted. Creatinine improved with fluid to 2.13 and is stable at that level today, still slightly above baseline. Voiding well, no allograft tenderness or SP tenderness. Still no prograf level result. For completeness, agrees to [place] of her allograft.   Pax Reasoner B,MD 04/19/2016 12:39 PM

## 2016-04-19 NOTE — Progress Notes (Addendum)
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  Ready to go home.   Inpatient Medications    Scheduled Meds: . amLODipine  10 mg Oral Daily  . aspirin EC  81 mg Oral Daily  . atorvastatin  40 mg Oral QHS  . chlorhexidine gluconate (MEDLINE KIT)  15 mL Mouth Rinse BID  . clopidogrel  75 mg Oral Daily  . docusate sodium  200 mg Oral Daily  . dorzolamide-timolol  1 drop Right Eye BID  . enoxaparin (LOVENOX) injection  30 mg Subcutaneous QHS  . famotidine  20 mg Oral Daily  . insulin aspart  0-24 Units Subcutaneous TID AC & HS  . insulin detemir  20 Units Subcutaneous Daily  . mouth rinse  15 mL Mouth Rinse QID  . metoCLOPramide  5 mg Oral TID AC  . metoprolol tartrate  50 mg Oral BID  . mycophenolate  360 mg Oral BID  . predniSONE  5 mg Oral Q breakfast  . sodium chloride flush  3 mL Intravenous Q12H  . sulfamethoxazole-trimethoprim  1 tablet Oral Q M,W,F  . tacrolimus  2 mg Oral QHS  . tacrolimus  3 mg Oral Q breakfast   Continuous Infusions:  PRN Meds: sodium chloride, acetaminophen, bisacodyl **OR** bisacodyl, lactulose, ondansetron **OR** ondansetron (ZOFRAN) IV, oxyCODONE-acetaminophen, simethicone, sodium chloride flush, traMADol   Vital Signs    Vitals:   04/18/16 1430 04/18/16 1948 04/19/16 0433 04/19/16 0945  BP: 134/67 (!) 160/87 (!) 152/78 (!) 153/58  Pulse: 68 78 73 77  Resp:  _0 Temp:  97.7 F (36.5 C) 98.5 F (36.9 C) 98.5 F (36.9 C)  TempSrc:  Oral Oral Oral  SpO2:  99%  99% 100%  Weight:   196 lb 9.6 oz (89.2 kg)   Height:        Intake/Output Summary (Last 24 hours) at 04/19/16 1227 Last data filed at 04/19/16 0800  Gross per 24 hour  Intake              240 ml  Output                0 ml  Net              240 ml   Filed Weights   04/17/16 0408 04/18/16 0617 04/19/16 0433  Weight: 198 lb 14.4 oz (90.2 kg) 198 lb 11.2 oz (90.1 kg) 196 lb 9.6 oz (89.2 kg)    Physical Exam    GEN: Well nourished, well developed, in no acute distress.  HEENT: Grossly normal.  Neck: Supple, no JVD, carotid bruits, or masses. Cardiac: RRR, soft SEM LSB rubs, or gallops. No clubbing, cyanosis, edema.  Radials/DP/PT 2+ and equal bilaterally.  Respiratory:  Respirations regular and unlabored, clear to auscultation bilaterally. GI: Soft, nontender, nondistended, BS + x 4. MS: no deformity or atrophy. Skin: warm and dry, no rash. Neuro:  Strength and sensation are intact. Psych: AAOx3.  Normal affect.  Labs    CBC  Recent Labs  04/18/16 0551 04/19/16 0214  WBC 6.0 4.9  HGB 9.3* 8.8*  HCT 29.1* 27.4*  MCV 84.8 85.1  PLT 347 071   Basic Metabolic Panel  Recent Labs  04/18/16 0551 04/19/16 0214  NA 135 139  K 4.4 4.8  CL 105 108  CO2 22 23  GLUCOSE 106* 106*  BUN 26* 28*  CREATININE 2.13* 2.13*  CALCIUM 9.3 9.5     Telemetry    NSR - Personally Reviewed  ECG    NSR, lateral TWI- Personally Reviewed  Radiology    PCXR 04/14/16  PORTABLE CHEST 1 VIEW  COMPARISON:  04/13/2016  FINDINGS: Cardiac shadow remains enlarged. Postoperative changes are again seen. The Swan-Ganz catheter has been removed. Right jugular sheath remains. A mediastinal drain and pericardial drain have been removed as well. No acute bony abnormality is seen. Mild right upper lobe atelectatic changes are seen left basilar atelectasis remains.  IMPRESSION: Interval removal of tubes and lines. No pneumothorax is seen. Mild right upper lobe atelectasis is seen.  Persistent left basilar atelectasis is noted  Cardiac Studies   Echo 04/09/16 Study Conclusions  - Left ventricle: The cavity size was normal. Wall thickness was   increased in a pattern of mild LVH. Moderate septal hypertrophy. Systolic function was vigorous. The estimated ejection fraction was in the range of 65% to 70%.  Wall motion was normal; there were no regional wall motion abnormalities.  Doppler parameters are consistent with abnormal left ventricular relaxation (grade 1 diastolic dysfunction).  Doppler parameters are consistent with high ventricular filling pressure. - Mitral valve: Calcified annulus.  Patient Profile     60 y/o female with a history of renal disease, s/p renal transplant in 2011, CRI-3 post transplant, IDDM, and HTN- admitted 04/07/16 with a NSTEMI. 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 LVF was normal by echo. RCA PCI was unsuccessful.  She underwent CABG x 3 with SVG-OM1-OM2 and SVG-PDA 04/12/16. Post op she has had acute on chronic renal insufficiency.   Assessment & Plan     CABG x 3 04/12/16- SVG to OMs and PDA, native LAD 40%.  Agree with discharge meds.  We have follow up scheduled.   H/O renal transplant- at [facility] in 2011 with acute on chronic renal insufficiency post CABG, renal following.   IDDM- on sliding scale, BS stable  Essential HTN- B/P controlled  Plan: F/U with Kerin Ransom arranged.   Signed, Minus Breeding, MD  04/19/2016, 12:27 PM

## 2016-04-19 NOTE — Progress Notes (Addendum)
[address]       [place] [address]             [phone]      7 Days Post-Op Procedure(s) (LRB): CORONARY ARTERY BYPASS GRAFTING (CABG) times three using left saphenous vein harvested endoscopically (N/A) TRANSESOPHAGEAL ECHOCARDIOGRAM (TEE) (N/A) Subjective: Feels well, Has had 3 normal BM's  Objective: Vital signs in last 24 hours: Temp:  [97.7 F (36.5 C)-98.9 F (37.2 C)] 98.5 F (36.9 C) (11/10 0433) Pulse Rate:  [68-80] 73 (11/10 0433) Cardiac Rhythm: Normal sinus rhythm (11/09 2030) Resp:  [20] 20 (11/10 0433) BP: (110-160)/(57-87) 152/78 (11/10 0433) SpO2:  [99 %-100 %] 99 % (11/10 0433) Weight:  [196 lb 9.6 oz (89.2 kg)] 196 lb 9.6 oz (89.2 kg) (11/10 0433)  Hemodynamic parameters for last 24 hours:    Intake/Output from previous day: No intake/output data recorded. Intake/Output this shift: No intake/output data recorded.  General appearance: alert, cooperative and no distress Heart: regular rate and rhythm Lungs: clear to auscultation bilaterally Abdomen: benign Extremities: minor edema Wound: incis healing well  Lab Results:  Recent Labs  04/18/16 0551 04/19/16 0214  WBC 6.0 4.9  HGB 9.3* 8.8*  HCT 29.1* 27.4*  PLT 347 369   BMET:  Recent Labs  04/18/16 0551 04/19/16 0214  NA 135 139  K 4.4 4.8  CL 105 108  CO2 22 23  GLUCOSE 106* 106*  BUN 26* 28*  CREATININE 2.13* 2.13*  CALCIUM 9.3 9.5    PT/INR: No results for input(s): LABPROT, INR in the last 72 hours. ABG    Component Value Date/Time   PHART 7.392 04/13/2016 0528   HCO3 21.5 04/13/2016 0528   TCO2 23 04/13/2016 1707   ACIDBASEDEF 3.0 (H) 04/13/2016 0528   O2SAT 99.0 04/13/2016 0528   CBG (last 3)   Recent Labs  04/18/16 1055 04/18/16 1645 04/18/16 2043  GLUCAP 269* 74 109*    Meds Scheduled Meds: . amLODipine  10 mg Oral Daily  . aspirin EC  81 mg Oral Daily  . atorvastatin  40 mg Oral QHS  . chlorhexidine gluconate (MEDLINE KIT)   15 mL Mouth Rinse BID  . clopidogrel  75 mg Oral Daily  . docusate sodium  200 mg Oral Daily  . dorzolamide-timolol  1 drop Right Eye BID  . enoxaparin (LOVENOX) injection  30 mg Subcutaneous QHS  . famotidine  20 mg Oral Daily  . insulin aspart  0-24 Units Subcutaneous TID AC & HS  . insulin detemir  20 Units Subcutaneous Daily  . mouth rinse  15 mL Mouth Rinse QID  . metoCLOPramide  5 mg Oral TID AC  . metoprolol tartrate  50 mg Oral BID  . mycophenolate  360 mg Oral BID  . predniSONE  5 mg Oral Q breakfast  . sodium chloride flush  3 mL Intravenous Q12H  . sodium phosphate  1 enema Rectal Once  . sulfamethoxazole-trimethoprim  1 tablet Oral Q M,W,F  . tacrolimus  2 mg Oral QHS  . tacrolimus  3 mg Oral Q breakfast   Continuous Infusions: PRN Meds:.sodium chloride, acetaminophen, bisacodyl **OR** bisacodyl, lactulose, ondansetron **OR** ondansetron (ZOFRAN) IV, oxyCODONE-acetaminophen, simethicone, sodium chloride flush, traMADol  Xrays US Renal Transplant W/doppler  Result Date: 04/17/2016 CLINICAL DATA:  Status post CABG with renal insufficiency. History of prior renal transplantation. EXAM: RENAL / URINARY TRACT ULTRASOUND COMPLETE COMPARISON:  None. FINDINGS: Right Kidney: Length: 7.0 cm. Shrunken echogenic kidney without  obstruction. No focal lesion identified. Left Kidney: Length: 7.4 cm. Shrunken echogenic kidney without hydronephrosis or focal lesion. Bladder: Appears normal for degree of bladder distention. After initial ultrasound of the native kidneys, the patient was brought back for imaging of her transplanted kidney as it was initially not recognized by the sonographer that the patient had a history of renal transplantation. When she was brought back down, the patient adamantly refused to undergo any imaging of her transplant kidney. This has been documented by Ultrasound staff. IMPRESSION: 1. Small echogenic native kidneys consistent with end-stage renal disease. No evidence  of obstruction or mass of the native kidneys. 2. The patient was brought back to the department for imaging of her transplanted kidney. She adamantly refused any imaging of the transplant and no further ultrasound imaging was performed. Electronically Signed   By: Aletta Edouard M.D.   On: 04/17/2016 17:26    Assessment/Plan: S/P Procedure(s) (LRB): CORONARY ARTERY BYPASS GRAFTING (CABG) times three using left saphenous vein harvested endoscopically (N/A) TRANSESOPHAGEAL ECHOCARDIOGRAM (TEE) (N/A)  1 doing well 2 BP still elevated at times, probably can be managed as outpatient 3 poss d/c later today if all agree, she wishes to talk to social worker  LOS: 12 days    GOLD,WAYNE E 04/19/2016  Chart reviewed, patient examined, agree with above. She is doing well and bowels working normally. I think she can be discharged. She apparently does not qualify for SNF so she will have to go home by herself. Hopefully some family can check on her.

## 2016-04-19 NOTE — Progress Notes (Signed)
Discussed with the patient and all questioned fully answered. She will call me if any problems arise.  Pt given paperwork. Daughter witnessed. Pt requested copy of her signature page - given to pt. Pt given paper prescriptions for oxycodone and lipitor. IVs discontinued, telemetry removed.  Fritz Pickerel, RN

## 2016-04-19 NOTE — Progress Notes (Signed)
[id number] Education  Completed with pt and daughter who voiced understanding. Encouraged IS and walking for pulmonary function. Discussed heart healthy and diabetic diet. Discussed CRP 2 and will refer to [place] porgram. Reviewed sternal precautions. Graylon Good RN BSN 04/19/2016 12:10 PM

## 2016-04-22 NOTE — Care Management Note (Signed)
Case Management Note Marvetta Gibbons RN, BSN Unit 2W-Case Manager [phone]  Patient Details  Name: April Pena MRN: [id number] Date of Birth: 1955-08-18  Subjective/Objective:  Pt s/p CABG                Action/Plan: PTA pt lived at home alone- asked to speak with pt and daughter regarding d/c plans- in to speak with pt and daughter at bedside- per conversation daughter works- and pt does not have 24/7 supervision- they are interested in rehab- explained to pt and daughter that pt is walking 500 ft and would not be approved for INPT rehab- also explained that insurance would not pay for STSNF as pt does not therapy at this time. Discussed options at home for supervision. Explained that pt would not qualify under Medicaid for [facility] therapies. Pt and daughter state that they are not interested in paying out of pocket. Plan for pt to return home with family support.   Expected Discharge Date:      04/19/16            Expected Discharge Plan:  Home/Self Care  In-House Referral:  Clinical Social Work  Discharge planning Services  CM Consult  Post Acute Care Choice:  NA Choice offered to:  NA  DME Arranged:  N/A DME Agency:  NA  HH Arranged:  NA HH Agency:  NA  Status of Service:  Completed, signed off  If discussed at [employer] of Stay Meetings, dates discussed:    Additional Comments:  Dawayne Patricia, RN 04/22/2016, 10:48 AM

## 2016-04-26 ENCOUNTER — Ambulatory Visit (INDEPENDENT_AMBULATORY_CARE_PROVIDER_SITE_OTHER): Payer: Self-pay | Admitting: *Deleted

## 2016-04-26 DIAGNOSIS — Z951 Presence of aortocoronary bypass graft: Secondary | ICD-10-CM

## 2016-04-26 DIAGNOSIS — Z4802 Encounter for removal of sutures: Secondary | ICD-10-CM

## 2016-04-26 DIAGNOSIS — I2510 Atherosclerotic heart disease of native coronary artery without angina pectoris: Secondary | ICD-10-CM

## 2016-04-26 NOTE — Progress Notes (Signed)
April Pena returns s/p CABG X 3 for suture removal of 2 previous chest tube sites. These sites as well as the sternal incision and the bilateral leg EVH incisions are all very well healed. Diet and bowels are good. She is using a rolling walker with seat. The sutures were easily  removed. She is concerned about the sternal notch being large at this point. I reassured her that it should smooth out. She will return as scheduled with a CXR.

## 2016-05-08 ENCOUNTER — Ambulatory Visit: Payer: Medicaid Other | Admitting: Cardiology

## 2016-05-08 ENCOUNTER — Ambulatory Visit: Payer: Medicare Other | Admitting: Physician Assistant

## 2016-05-15 ENCOUNTER — Ambulatory Visit (INDEPENDENT_AMBULATORY_CARE_PROVIDER_SITE_OTHER): Payer: Medicaid Other | Admitting: Cardiology

## 2016-05-15 ENCOUNTER — Encounter: Payer: Self-pay | Admitting: Cardiology

## 2016-05-15 VITALS — BP 134/62 | HR 59 | Ht 64.0 in | Wt 199.6 lb

## 2016-05-15 DIAGNOSIS — R748 Abnormal levels of other serum enzymes: Secondary | ICD-10-CM | POA: Diagnosis not present

## 2016-05-15 DIAGNOSIS — I214 Non-ST elevation (NSTEMI) myocardial infarction: Secondary | ICD-10-CM

## 2016-05-15 DIAGNOSIS — E1169 Type 2 diabetes mellitus with other specified complication: Secondary | ICD-10-CM

## 2016-05-15 DIAGNOSIS — N189 Chronic kidney disease, unspecified: Secondary | ICD-10-CM

## 2016-05-15 DIAGNOSIS — I10 Essential (primary) hypertension: Secondary | ICD-10-CM

## 2016-05-15 DIAGNOSIS — N289 Disorder of kidney and ureter, unspecified: Secondary | ICD-10-CM

## 2016-05-15 DIAGNOSIS — R778 Other specified abnormalities of plasma proteins: Secondary | ICD-10-CM

## 2016-05-15 DIAGNOSIS — Z951 Presence of aortocoronary bypass graft: Secondary | ICD-10-CM

## 2016-05-15 DIAGNOSIS — I2510 Atherosclerotic heart disease of native coronary artery without angina pectoris: Secondary | ICD-10-CM | POA: Diagnosis not present

## 2016-05-15 DIAGNOSIS — R7989 Other specified abnormal findings of blood chemistry: Principal | ICD-10-CM

## 2016-05-15 DIAGNOSIS — E669 Obesity, unspecified: Secondary | ICD-10-CM

## 2016-05-15 DIAGNOSIS — E785 Hyperlipidemia, unspecified: Secondary | ICD-10-CM

## 2016-05-15 DIAGNOSIS — Z940 Kidney transplant status: Secondary | ICD-10-CM

## 2016-05-15 NOTE — Assessment & Plan Note (Signed)
Controlled.  

## 2016-05-15 NOTE — Progress Notes (Signed)
05/15/2016 April Pena   November 26, 1955  [id number]  Primary Physician Guadlupe Spanish, MD Primary Cardiologist: Dr Percival Spanish (new)  HPI:  60 y/o AA female with a history of prior CAD followed in [place] (medically treated), end stage renal disease, s/p renal transplant in 2011 at [facility] [place] post transplant followed by Dr Joesph July in [facility], IDDM, and HTN- admitted 04/07/16 with a NSTEMI. 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 LVF was normal by echo. RCA PCI was unsuccessful.  She underwent CABG x 3 with SVG-OM1-OM2 and SVG-PDA 04/12/16. Post op she has had acute on chronic renal insufficiency but otherwise did well. She is in the office today for her first post CABG follow up. She has had some discomfort across her chest with movement- likely related to the surgery itself. She denies any unusual dyspnea or tachycardia.    Current Outpatient Prescriptions  Medication Sig Dispense Refill  . acetaminophen (TYLENOL) 500 MG tablet Take 500-1,000 mg by mouth every 6 (six) hours as needed (pain).     Marland Kitchen amLODipine (NORVASC) 10 MG tablet Take 10 mg by mouth daily.    Marland Kitchen aspirin EC 81 MG tablet Take 81 mg by mouth daily.    Marland Kitchen atorvastatin (LIPITOR) 20 MG tablet Take 20 mg by mouth daily.    . clopidogrel (PLAVIX) 75 MG tablet Take 75 mg by mouth daily.    Marland Kitchen dexlansoprazole (DEXILANT) 60 MG capsule Take 60 mg by mouth daily.    . dorzolamide-timolol (COSOPT) 22.3-6.8 MG/ML ophthalmic solution Place 1 drop into both eyes 2 (two) times daily.    Marland Kitchen gabapentin (NEURONTIN) 300 MG capsule Take 1 capsule (300 mg total) by mouth daily.    . insulin aspart (NOVOLOG FLEXPEN) 100 UNIT/ML FlexPen Inject 10-30 Units into the skin 3 (three) times daily with meals. Based on CBG or carb count    . Insulin Detemir (LEVEMIR FLEXTOUCH) 100 UNIT/ML Pen Inject 20 Units into the skin daily. 15 mL 11  . isosorbide mononitrate (IMDUR) 60 MG 24 hr tablet Take 60 mg  by mouth daily.    . magnesium oxide (MAG-OX) 400 MG tablet Take 400 mg by mouth 2 (two) times daily.    . metoprolol succinate (TOPROL-XL) 100 MG 24 hr tablet Take 100 mg by mouth daily. Take with or immediately following a meal.    . mycophenolate (MYFORTIC) 180 MG EC tablet Take 360 mg by mouth 2 (two) times daily.     Marland Kitchen oxyCODONE-acetaminophen (PERCOCET) 7.5-325 MG tablet Take one or two tablet every 4-6 hours PRN severe pain. 28 tablet 0  . prednisoLONE acetate (PRED FORTE) 1 % ophthalmic suspension Place 1 drop into the left eye 4 (four) times daily as needed (itching).    . predniSONE (DELTASONE) 5 MG tablet Take 5 mg by mouth daily.      . sodium bicarbonate 650 MG tablet Take 650 mg by mouth 2 (two) times daily.    Marland Kitchen sulfamethoxazole-trimethoprim (BACTRIM,SEPTRA) 400-80 MG per tablet Take 1 tablet by mouth every Monday, Wednesday, and Friday.     . tacrolimus (PROGRAF) 1 MG capsule Take 2-3 mg by mouth See admin instructions. Take 3 capsules (3 mg) by mouth every morning and 2 capsules (2 mg) at night     No current facility-administered medications for this visit.     Allergies  Allergen Reactions  . Iron Hives and Other (See Comments)    Elevated blood  sugar, glaucoma worsened,   . Promethazine Other (See Comments)    Pt states her muscles jerk  . Wellbutrin [Bupropion Hcl] Nausea And Vomiting    Social History   Social History  . Marital status: Divorced    Spouse name: N/A  . Number of children: 2  . Years of education: N/A   Occupational History  . Disabled    Social History Main Topics  . Smoking status: Former Research scientist (life sciences)  . Smokeless tobacco: Never Used     Comment: Years ago  . Alcohol use No  . Drug use: No  . Sexual activity: Not on file   Other Topics Concern  . Not on file   Social History Narrative   Lives alone        Review of Systems: General: negative for chills, fever, night sweats or weight changes.  Cardiovascular: negative for chest pain,  dyspnea on exertion, edema, orthopnea, palpitations, paroxysmal nocturnal dyspnea or shortness of breath Dermatological: negative for rash Respiratory: negative for cough or wheezing Urologic: negative for hematuria Abdominal: negative for nausea, vomiting, diarrhea, bright red blood per rectum, melena, or hematemesis Neurologic: negative for visual changes, syncope, or dizziness All other systems reviewed and are otherwise negative except as noted above.    Blood pressure 134/62, pulse (!) 59, height 5\' 4"  (1.626 m), weight 199 lb 9.6 oz (90.5 kg).  General appearance: alert, cooperative, no distress and OS opaque Neck: no JVD Lungs: clear to auscultation bilaterally Heart: regular rate and rhythm Abdomen: RLQ surgical scar Extremities: extremities normal, atraumatic, no cyanosis or edema Skin: Skin color, texture, turgor normal. No rashes or lesions Neurologic: Grossly normal  EKG NSRTWI AVL and lead 1, Q wave in V2.  ASSESSMENT AND PLAN:   S/P CABG x 3 -Nov 2017 SVG-OM1-OM2, SVG-PD. 40% native LAD, normal LVF by echo  S/p cadaver renal transplant 2011 S/P renal transplant at [facility]  Acute on chronic renal insufficiency post CABG Followed by Dr Joesph July in [facility], SCr 2.13 on 04/19/16  Diabetes mellitus type 2 in obese (HCC) Type 2 IDDM  Dyslipidemia On high dose statin Rx  Essential hypertension Controlled   PLAN  Same cardiac Rx. She has a follow up with Dr Joesph July on Dec 11th and Dr Cyndia Bent Dec 13th. F/U with dr Percival Spanish in three months.   Kerin Ransom PA-C 05/15/2016 3:42 PM

## 2016-05-15 NOTE — Assessment & Plan Note (Signed)
S/P renal transplant at [facility]

## 2016-05-15 NOTE — Assessment & Plan Note (Signed)
Type 2 IDDM 

## 2016-05-15 NOTE — Patient Instructions (Addendum)
Medication Instructions:  Your physician recommends that you continue on your current medications as directed. Please refer to the Current Medication list given to you today.   Labwork: None ordered  Testing/Procedures: None ordered  Follow-Up: Your physician recommends that you schedule a follow-up appointment in: 3 MONTHS WITH DR. HOCHREIN   Any Other Special Instructions Will Be Listed Below (If Applicable).     If you need a refill on your cardiac medications before your next appointment, please call your pharmacy.

## 2016-05-15 NOTE — Assessment & Plan Note (Signed)
Followed by Dr Joesph July in [facility], SCr 2.13 on 04/19/16

## 2016-05-15 NOTE — Assessment & Plan Note (Signed)
SVG-OM1-OM2, SVG-PD. 40% native LAD, normal LVF by echo

## 2016-05-15 NOTE — Assessment & Plan Note (Signed)
On high dose statin Rx 

## 2016-05-21 ENCOUNTER — Other Ambulatory Visit: Payer: Self-pay | Admitting: Surgery

## 2016-05-21 DIAGNOSIS — Z951 Presence of aortocoronary bypass graft: Secondary | ICD-10-CM

## 2016-05-22 ENCOUNTER — Ambulatory Visit (INDEPENDENT_AMBULATORY_CARE_PROVIDER_SITE_OTHER): Payer: Self-pay | Admitting: Surgery

## 2016-05-22 ENCOUNTER — Encounter: Payer: Self-pay | Admitting: Surgery

## 2016-05-22 ENCOUNTER — Ambulatory Visit
Admission: RE | Admit: 2016-05-22 | Discharge: 2016-05-22 | Disposition: A | Discharge status: Home / Self Care | Payer: Medicaid Other | Source: Ambulatory Visit | Attending: Surgery | Admitting: Surgery

## 2016-05-22 VITALS — BP 132/65 | HR 60 | Resp 16 | Ht 64.0 in | Wt 199.0 lb

## 2016-05-22 DIAGNOSIS — Z951 Presence of aortocoronary bypass graft: Secondary | ICD-10-CM

## 2016-05-22 DIAGNOSIS — I2510 Atherosclerotic heart disease of native coronary artery without angina pectoris: Secondary | ICD-10-CM

## 2016-05-22 NOTE — Progress Notes (Signed)
ALL OPERATIVE INCISIONS ARE VERY WELL HEALED.

## 2016-05-23 ENCOUNTER — Encounter: Payer: Self-pay | Admitting: Surgery

## 2016-05-23 NOTE — Progress Notes (Signed)
HPI: Patient returns for routine postoperative follow-up having undergone CABG x 3 on 04/12/2016. The patient's early postoperative recovery while in the hospital was notable for development of a rising creatinine to 2.5 but then it decreased with IV hydration prior to discharge. She was seen by nephrology. Since hospital discharge the patient reports that she has been feeling better every week. She is walking without chest pain or shortness of breath.   Current Outpatient Prescriptions  Medication Sig Dispense Refill  . acetaminophen (TYLENOL) 500 MG tablet Take 500-1,000 mg by mouth every 6 (six) hours as needed (pain).     Marland Kitchen amLODipine (NORVASC) 10 MG tablet Take 10 mg by mouth daily.    Marland Kitchen aspirin EC 81 MG tablet Take 81 mg by mouth daily.    Marland Kitchen atorvastatin (LIPITOR) 20 MG tablet Take 20 mg by mouth daily.    . clopidogrel (PLAVIX) 75 MG tablet Take 75 mg by mouth daily.    Marland Kitchen dexlansoprazole (DEXILANT) 60 MG capsule Take 60 mg by mouth daily.    . dorzolamide-timolol (COSOPT) 22.3-6.8 MG/ML ophthalmic solution Place 1 drop into both eyes 2 (two) times daily.    Marland Kitchen gabapentin (NEURONTIN) 300 MG capsule Take 1 capsule (300 mg total) by mouth daily.    . insulin aspart (NOVOLOG FLEXPEN) 100 UNIT/ML FlexPen Inject 10-30 Units into the skin 3 (three) times daily with meals. Based on CBG or carb count    . Insulin Detemir (LEVEMIR FLEXTOUCH) 100 UNIT/ML Pen Inject 20 Units into the skin daily. 15 mL 11  . isosorbide mononitrate (IMDUR) 60 MG 24 hr tablet Take 60 mg by mouth daily.    . magnesium oxide (MAG-OX) 400 MG tablet Take 400 mg by mouth 2 (two) times daily.    . metoprolol succinate (TOPROL-XL) 100 MG 24 hr tablet Take 100 mg by mouth daily. Take with or immediately following a meal.    . mycophenolate (MYFORTIC) 180 MG EC tablet Take 360 mg by mouth 2 (two) times daily.     Marland Kitchen oxyCODONE-acetaminophen (PERCOCET) 7.5-325 MG tablet Take one or two tablet every 4-6 hours PRN severe  pain. 28 tablet 0  . prednisoLONE acetate (PRED FORTE) 1 % ophthalmic suspension Place 1 drop into the left eye 4 (four) times daily as needed (itching).    . predniSONE (DELTASONE) 5 MG tablet Take 5 mg by mouth daily.      . sodium bicarbonate 650 MG tablet Take 650 mg by mouth 2 (two) times daily.    Marland Kitchen sulfamethoxazole-trimethoprim (BACTRIM,SEPTRA) 400-80 MG per tablet Take 1 tablet by mouth every Monday, Wednesday, and Friday.     . tacrolimus (PROGRAF) 1 MG capsule Take 2-3 mg by mouth See admin instructions. Take 3 capsules (3 mg) by mouth every morning and 2 capsules (2 mg) at night     No current facility-administered medications for this visit.     Physical Exam: BP 132/65 (BP Location: Right Arm, Patient Position: Sitting, Cuff Size: Large)   Pulse 60   Resp 16   Ht 5\' 4"  (1.626 m)   Wt 199 lb (90.3 kg)   SpO2 98% Comment: ON RA  BMI 34.16 kg/m  She looks well Lungs are clear Cardiac exam shows a regular rate and rhythm with normal heart sounds. The chest incision is healing well and the sternum is stable. The leg incisions are healing well and there is no significant edema.   Diagnostic Tests:  CLINICAL DATA:  History of  CABG, chest pain  EXAM: CHEST  2 VIEW  COMPARISON:  04/14/2016  FINDINGS: Cardiomediastinal silhouette is stable. Status post CABG. No infiltrate or pleural effusion. No pulmonary edema. Bony thorax is unremarkable.  IMPRESSION: No active cardiopulmonary disease.  Status post CABG.   Electronically Signed   By: Lahoma Crocker M.D.   On: 05/22/2016 13:08   Impression:  Overall I think she is doing well. I encouraged her to continue walking. She is planning to participate in cardiac rehab. I told her not to lift anything heavier than 10 lbs for three months postop.   Plan:  She will continue to follow up with her PCP and nephrologist as well as Dr. Percival Spanish for her cardiology care. She will return to see me if she has any problems  with her incisions.   Gaye Pollack, MD [facility] [phone]

## 2016-08-09 ENCOUNTER — Encounter: Payer: Self-pay | Admitting: Cardiology

## 2016-08-12 ENCOUNTER — Emergency Department (HOSPITAL_BASED_OUTPATIENT_CLINIC_OR_DEPARTMENT_OTHER)
Admission: EM | Admit: 2016-08-12 | Discharge: 2016-08-12 | Disposition: A | Discharge status: Home / Self Care | Payer: Medicaid Other | Attending: Emergency Medicine | Admitting: Emergency Medicine

## 2016-08-12 ENCOUNTER — Encounter (HOSPITAL_BASED_OUTPATIENT_CLINIC_OR_DEPARTMENT_OTHER): Payer: Self-pay

## 2016-08-12 ENCOUNTER — Emergency Department (HOSPITAL_BASED_OUTPATIENT_CLINIC_OR_DEPARTMENT_OTHER): Payer: Medicaid Other

## 2016-08-12 DIAGNOSIS — I130 Hypertensive heart and chronic kidney disease with heart failure and stage 1 through stage 4 chronic kidney disease, or unspecified chronic kidney disease: Secondary | ICD-10-CM | POA: Diagnosis not present

## 2016-08-12 DIAGNOSIS — Z951 Presence of aortocoronary bypass graft: Secondary | ICD-10-CM | POA: Diagnosis not present

## 2016-08-12 DIAGNOSIS — I509 Heart failure, unspecified: Secondary | ICD-10-CM | POA: Insufficient documentation

## 2016-08-12 DIAGNOSIS — Z87891 Personal history of nicotine dependence: Secondary | ICD-10-CM | POA: Diagnosis not present

## 2016-08-12 DIAGNOSIS — R600 Localized edema: Secondary | ICD-10-CM | POA: Diagnosis not present

## 2016-08-12 DIAGNOSIS — M7989 Other specified soft tissue disorders: Secondary | ICD-10-CM | POA: Diagnosis present

## 2016-08-12 DIAGNOSIS — E1122 Type 2 diabetes mellitus with diabetic chronic kidney disease: Secondary | ICD-10-CM | POA: Insufficient documentation

## 2016-08-12 DIAGNOSIS — Z79899 Other long term (current) drug therapy: Secondary | ICD-10-CM | POA: Insufficient documentation

## 2016-08-12 DIAGNOSIS — N183 Chronic kidney disease, stage 3 (moderate): Secondary | ICD-10-CM | POA: Insufficient documentation

## 2016-08-12 DIAGNOSIS — Z7982 Long term (current) use of aspirin: Secondary | ICD-10-CM | POA: Diagnosis not present

## 2016-08-12 DIAGNOSIS — Z794 Long term (current) use of insulin: Secondary | ICD-10-CM | POA: Insufficient documentation

## 2016-08-12 LAB — CBC WITH DIFFERENTIAL/PLATELET
BASOS PCT: 0 %
Basophils Absolute: 0 10*3/uL (ref 0.0–0.1)
EOS ABS: 0.1 10*3/uL (ref 0.0–0.7)
EOS PCT: 1 %
HCT: 35.4 % — ABNORMAL LOW (ref 36.0–46.0)
Hemoglobin: 11.3 g/dL — ABNORMAL LOW (ref 12.0–15.0)
Lymphocytes Relative: 19 %
Lymphs Abs: 1 10*3/uL (ref 0.7–4.0)
MCH: 27.6 pg (ref 26.0–34.0)
MCHC: 31.9 g/dL (ref 30.0–36.0)
MCV: 86.6 fL (ref 78.0–100.0)
MONO ABS: 0.5 10*3/uL (ref 0.1–1.0)
MONOS PCT: 9 %
NEUTROS PCT: 71 %
Neutro Abs: 3.6 10*3/uL (ref 1.7–7.7)
PLATELETS: 230 10*3/uL (ref 150–400)
RBC: 4.09 MIL/uL (ref 3.87–5.11)
RDW: 13.9 % (ref 11.5–15.5)
WBC: 5.1 10*3/uL (ref 4.0–10.5)

## 2016-08-12 LAB — BASIC METABOLIC PANEL
Anion gap: 7 (ref 5–15)
BUN: 28 mg/dL — AB (ref 6–20)
CALCIUM: 9.4 mg/dL (ref 8.9–10.3)
CO2: 25 mmol/L (ref 22–32)
CREATININE: 1.68 mg/dL — AB (ref 0.44–1.00)
Chloride: 107 mmol/L (ref 101–111)
GFR calc non Af Amer: 32 mL/min — ABNORMAL LOW (ref >60)
GFR, EST AFRICAN AMERICAN: 37 mL/min — AB (ref >60)
GLUCOSE: 124 mg/dL — AB (ref 65–99)
Potassium: 4.8 mmol/L (ref 3.5–5.1)
Sodium: 139 mmol/L (ref 135–145)

## 2016-08-12 NOTE — ED Notes (Signed)
ED Provider at bedside. 

## 2016-08-12 NOTE — ED Notes (Signed)
Richardson Landry, RT at bedside for lab draw.

## 2016-08-12 NOTE — ED Notes (Signed)
Patient transported to Ultrasound 

## 2016-08-12 NOTE — ED Provider Notes (Signed)
[facility] DEPT [facility] Provider Note   CSN: [id number] Arrival date & time: 08/12/16  1259   By signing my name below, I, Avnee Patel, attest that this documentation has been prepared under the direction and in the presence of Quintella Reichert, MD  Electronically Signed: Delton Prairie, ED Scribe. 08/12/16. 3:45 PM.   History   Chief Complaint Chief Complaint  Patient presents with  . Leg Swelling   The history is provided by the patient. No language interpreter was used.   HPI Comments:  Candida Vetter is a 61 y.o. female, with a PMHx of CHF, DM, HTN and PSHx of CAGB in 04/2016 and kidney transplant in 2011, who presents to the Emergency Department complaining of acute onset, moderate bilateral leg swelling with associated pain onset 2 days. She notes the pain is localized behind her knees. She is on Plavix and immunosuppressants. No alleviating factors noted. Pt denies fever, nausea, vomiting, SOB, chest pain, difficulty urinating, hormone use, change in medications or any other associated symptoms. Pt is a non-smoker.   Past Medical History:  Diagnosis Date  . Blind left eye   . Blood transfusion without reported diagnosis   . CHF (congestive heart failure) ([place])   . Diabetes mellitus   . Diabetes mellitus type 2 in obese ([place]) 04/11/2016  . Glaucoma   . Glaucoma   . Groin hematoma, initial encounter 04/11/2016  . Hyperlipidemia 04/11/2016  . Hypertension   . Hypertensive heart disease 04/11/2016  . MI (myocardial infarction)   . Renal disorder    kidney transplant  . Unstable angina [facility])     Patient Active Problem List   Diagnosis Date Noted  . S/p cadaver renal transplant 2011 04/17/2016  . Chronic renal disease, stage III s/p transplant 04/17/2016  . Acute on chronic renal insufficiency post CABG 04/17/2016  . 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 Essential hypertension 04/11/2016  . Dyslipidemia 04/11/2016  . Groin hematoma, initial encounter 04/11/2016  . Diabetes  mellitus type 2 in obese ([place]) 04/11/2016  . NSTEMI (non-ST elevated myocardial infarction) ([place]) 04/07/2016  . Low back pain radiating to right leg 04/04/2011    Past Surgical History:  Procedure Laterality Date  . ABDOMINAL HYSTERECTOMY    . CARDIAC CATHETERIZATION N/A 04/08/2016   Procedure: Left Heart Cath and Coronary Angiography;  Surgeon: Burnell Blanks, MD;  Location: [facility];  Service: Cardiovascular;  Laterality: N/A;  . CATARACT EXTRACTION    . CHOLECYSTECTOMY    . CORONARY ARTERY BYPASS GRAFT N/A 04/12/2016   Procedure: CORONARY ARTERY BYPASS GRAFTING (CABG) times three using left saphenous vein harvested endoscopically;  Surgeon: Gaye Pollack, MD;  Location: [facility] OR;  Service: Open Heart Surgery;  Laterality: N/A;  SEQ SVG-OM1-OM2 SVG-PD  . EYE SURGERY     cataract  . KIDNEY TRANSPLANT  2011  . TEE WITHOUT CARDIOVERSION N/A 04/12/2016   Procedure: TRANSESOPHAGEAL ECHOCARDIOGRAM (TEE);  Surgeon: Gaye Pollack, MD;  Location: [place];  Service: Open Heart Surgery;  Laterality: N/A;  . TONSILLECTOMY    . TRIGGER FINGER RELEASE      OB History    No data available       Home Medications    Prior to Admission medications   Medication Sig Start Date End Date Taking? Authorizing Provider  acetaminophen (TYLENOL) 500 MG tablet Take 500-1,000 mg by mouth every 6 (six) hours as needed (pain).     Historical Provider, MD  amLODipine (NORVASC) 10 MG tablet  Take 10 mg by mouth daily.    Historical Provider, MD  aspirin EC 81 MG tablet Take 81 mg by mouth daily.    Historical Provider, MD  atorvastatin (LIPITOR) 20 MG tablet Take 20 mg by mouth daily.    Historical Provider, MD  clopidogrel (PLAVIX) 75 MG tablet Take 75 mg by mouth daily.    Historical Provider, MD  dexlansoprazole (DEXILANT) 60 MG capsule Take 60 mg by mouth daily.    Historical Provider, MD  dorzolamide-timolol (COSOPT) 22.3-6.8 MG/ML ophthalmic solution Place 1 drop into both eyes 2 (two)  times daily.    Historical Provider, MD  gabapentin (NEURONTIN) 300 MG capsule Take 1 capsule (300 mg total) by mouth daily. 04/19/16 12/27/29  Donielle Liston Alba, PA-C  insulin aspart (NOVOLOG FLEXPEN) 100 UNIT/ML FlexPen Inject 10-30 Units into the skin 3 (three) times daily with meals. Based on CBG or carb count    Historical Provider, MD  Insulin Detemir (LEVEMIR FLEXTOUCH) 100 UNIT/ML Pen Inject 20 Units into the skin daily. 04/19/16   Donielle Liston Alba, PA-C  isosorbide mononitrate (IMDUR) 60 MG 24 hr tablet Take 60 mg by mouth daily. 11/11/09   Historical Provider, MD  magnesium oxide (MAG-OX) 400 MG tablet Take 400 mg by mouth 2 (two) times daily.    Historical Provider, MD  metoprolol succinate (TOPROL-XL) 100 MG 24 hr tablet Take 100 mg by mouth daily. Take with or immediately following a meal.    Historical Provider, MD  mycophenolate (MYFORTIC) 180 MG EC tablet Take 360 mg by mouth 2 (two) times daily.     Historical Provider, MD  oxyCODONE-acetaminophen (PERCOCET) 7.5-325 MG tablet Take one or two tablet every 4-6 hours PRN severe pain. 04/19/16   Donielle Liston Alba, PA-C  prednisoLONE acetate (PRED FORTE) 1 % ophthalmic suspension Place 1 drop into the left eye 4 (four) times daily as needed (itching).    Historical Provider, MD  predniSONE (DELTASONE) 5 MG tablet Take 5 mg by mouth daily.      Historical Provider, MD  sodium bicarbonate 650 MG tablet Take 650 mg by mouth 2 (two) times daily.    Historical Provider, MD  sulfamethoxazole-trimethoprim (BACTRIM,SEPTRA) 400-80 MG per tablet Take 1 tablet by mouth every Monday, Wednesday, and Friday.     Historical Provider, MD  tacrolimus (PROGRAF) 1 MG capsule Take 2-3 mg by mouth See admin instructions. Take 3 capsules (3 mg) by mouth every morning and 2 capsules (2 mg) at night    Historical Provider, MD    Family History Family History  Problem Relation Age of Onset  . Diabetes Mother   . Hypertension Mother   . Heart attack  Mother   . Diabetes Father   . Hypertension Sister   . Diabetes Brother   . Hypertension Brother   . Diabetes Paternal Grandmother   . Diabetes Daughter   . Hypertension Daughter   . Hyperlipidemia Neg Hx   . Sudden death Neg Hx     Social History Social History  Substance Use Topics  . Smoking status: Former Research scientist (life sciences)  . Smokeless tobacco: Never Used     Comment: Years ago  . Alcohol use No     Allergies   Iron; Promethazine; and Wellbutrin [bupropion hcl]   Review of Systems Review of Systems  Constitutional: Negative for fever.  Respiratory: Negative for shortness of breath.   Cardiovascular: Positive for leg swelling. Negative for chest pain.  Gastrointestinal: Negative for nausea and vomiting.  Genitourinary: Negative  for difficulty urinating.  All other systems reviewed and are negative.    Physical Exam Updated Vital Signs BP 148/61   Pulse (!) 55   Temp 98 F (36.7 C) (Oral)   Resp 18   Ht 5\' 4"  (1.626 m)   Wt 220 lb (99.8 kg)   SpO2 100%   BMI 37.76 kg/m   Physical Exam  Constitutional: She is oriented to person, place, and time. She appears well-developed and well-nourished.  HENT:  Head: Normocephalic and atraumatic.  Cardiovascular: Normal rate and regular rhythm.   Murmur heard.  Systolic murmur is present  Chronic systolic ejection murmur   Pulmonary/Chest: Effort normal and breath sounds normal. No respiratory distress.  Abdominal: Soft. There is no tenderness. There is no rebound and no guarding.  Musculoskeletal: She exhibits edema. She exhibits no tenderness.  2+ DP pulses with trace non pitting edema from knees to feet.   Neurological: She is alert and oriented to person, place, and time.  Skin: Skin is warm and dry.  Psychiatric: She has a normal mood and affect. Her behavior is normal.  Nursing note and vitals reviewed.  ED Treatments / Results  DIAGNOSTIC STUDIES:  Oxygen Saturation is 100% on RA, normal by my interpretation.      COORDINATION OF CARE:  3:41 PM Discussed treatment plan with pt at bedside and pt agreed to plan.  Labs (all labs ordered are listed, but only abnormal results are displayed) Labs Reviewed  BASIC METABOLIC PANEL - Abnormal; Notable for the following:       Result Value   Glucose, Bld 124 (*)    BUN 28 (*)    Creatinine, Ser 1.68 (*)    GFR calc non Af Amer 32 (*)    GFR calc Af Amer 37 (*)    All other components within normal limits  CBC WITH DIFFERENTIAL/PLATELET - Abnormal; Notable for the following:    Hemoglobin 11.3 (*)    HCT 35.4 (*)    All other components within normal limits    EKG  EKG Interpretation None       Radiology US Venous Img Lower Bilateral  Result Date: 08/12/2016 CLINICAL DATA:  Bilateral calf and popliteal fossa of swelling for 2-3 days. EXAM: BILATERAL LOWER EXTREMITY VENOUS DOPPLER ULTRASOUND TECHNIQUE: Gray-scale sonography with graded compression, as well as color Doppler and duplex ultrasound were performed to evaluate the lower extremity deep venous systems from the level of the common femoral vein and including the common femoral, femoral, profunda femoral, popliteal and calf veins including the posterior tibial, peroneal and gastrocnemius veins when visible. The superficial great saphenous vein was also interrogated. Spectral Doppler was utilized to evaluate flow at rest and with distal augmentation maneuvers in the common femoral, femoral and popliteal veins. COMPARISON:  None. FINDINGS: RIGHT LOWER EXTREMITY Common Femoral Vein: No evidence of thrombus. Normal compressibility, respiratory phasicity and response to augmentation. Saphenofemoral Junction: No evidence of thrombus. Normal compressibility and flow on color Doppler imaging. Profunda Femoral Vein: No evidence of thrombus. Normal compressibility and flow on color Doppler imaging. Femoral Vein: No evidence of thrombus. Normal compressibility, respiratory phasicity and response to  augmentation. Popliteal Vein: No evidence of thrombus. Normal compressibility, respiratory phasicity and response to augmentation. Calf Veins: No evidence of thrombus. Normal compressibility and flow on color Doppler imaging. Superficial Great Saphenous Vein: No evidence of thrombus. Normal compressibility and flow on color Doppler imaging. Venous Reflux:  None. Other Findings:  None. LEFT LOWER EXTREMITY Common Femoral Vein:  No evidence of thrombus. Normal compressibility, respiratory phasicity and response to augmentation. Saphenofemoral Junction: No evidence of thrombus. Normal compressibility and flow on color Doppler imaging. Profunda Femoral Vein: No evidence of thrombus. Normal compressibility and flow on color Doppler imaging. Femoral Vein: No evidence of thrombus. Normal compressibility, respiratory phasicity and response to augmentation. Popliteal Vein: No evidence of thrombus. Normal compressibility, respiratory phasicity and response to augmentation. Calf Veins: No evidence thrombus, but the left peroneal vein is not visualized. Superficial Great Saphenous Vein: No evidence of thrombus. Greater saphenous vein of the level of the left knee is not visualize. Normal compressibility and flow on color Doppler imaging. Venous Reflux:  None. Other Findings:  None. IMPRESSION: No evidence of deep venous thrombosis of bilateral lower extremities. Electronically Signed   By: Kathreen Devoid   On: 08/12/2016 17:16    Procedures Procedures (including critical care time)  Medications Ordered in ED Medications - No data to display   Initial Impression / Assessment and Plan / ED Course  I have reviewed the triage vital signs and the nursing notes.  Pertinent labs & imaging results that were available during my care of the patient were reviewed by me and considered in my medical decision making (see chart for details).     Patient with history of CKD, CABG, renal transplant here with bilateral lower  extremity swelling with pain in the left calf. She is well perfused on examination with no evidence of cellulitis. No evidence of CHF on exam or by history. Renal function is at her baseline. Vascular ultrasound is negative for DVT. D/w patient home care for lower extremity edema, likely related to recent activity changes. Discussed outpatient follow-up and return precautions.  Final Clinical Impressions(s) / ED Diagnoses   Final diagnoses:  Leg edema    New Prescriptions Discharge Medication List as of 08/12/2016  5:30 PM    I personally performed the services described in this documentation, which was scribed in my presence. The recorded information has been reviewed and is accurate.     Quintella Reichert, MD 08/13/16 [phone]

## 2016-08-12 NOTE — ED Triage Notes (Signed)
C/o bilat LE swelling x 3 days-was better then worse x today-NAD-steady gait with own roller walker

## 2016-08-15 NOTE — Progress Notes (Signed)
Cardiology Office Note   Date:  08/16/2016   ID:  Zoejane Gaulin, DOB 1955/10/09, MRN [id number]  PCP:  Guadlupe Spanish, MD  Cardiologist:   Minus Breeding, MD    Chief Complaint  Patient presents with  . Coronary Artery Disease      History of Present Illness: April Pena is a 61 y.o. female who presents for follow up of CAD.    She has a complicated medical history with CAD status post transplant x 3.  In October of last she she had an NSTEMI and was found to have two vessel CAD.  She had native LAD 40% with high grade Dx disease. LVF was normal by echo. RCA PCI was unsuccessful. She underwent CABG x 3 with SVG-OM1-OM2 and SVG-PDA 04/12/16. Post op she has had acute on chronic renal insufficiency but otherwise did well.  She presents for follow up.  She was in the ED for evaluation of leg edema last week.  I reviewed these records for this visit.  She had no DVT on ultrasound.    Since I last saw her she has done well. She was seen in Dec she has otherwise done well except for the discomfort as above.  She still has some right leg soreness.  The patient denies any new symptoms such as chest discomfort that she had previously, neck or arm discomfort. There has been no new shortness of breath, PND or orthopnea. There have been no reported palpitations, presyncope or syncope.   She does have some sternal soreness.    Past Medical History:  Diagnosis Date  . Blind left eye   . Blood transfusion without reported diagnosis   . CHF (congestive heart failure) ([place])   . Diabetes mellitus   . Diabetes mellitus type 2 in obese ([place]) 04/11/2016  . Glaucoma   . Glaucoma   . Groin hematoma, initial encounter 04/11/2016  . Hyperlipidemia 04/11/2016  . Hypertension   . Hypertensive heart disease 04/11/2016  . MI (myocardial infarction)   . Renal disorder    kidney transplant  . Unstable angina [facility])     Past Surgical History:  Procedure Laterality Date  . ABDOMINAL HYSTERECTOMY    .  CARDIAC CATHETERIZATION N/A 04/08/2016   Procedure: Left Heart Cath and Coronary Angiography;  Surgeon: Burnell Blanks, MD;  Location: [place] [facility];  Service: Cardiovascular;  Laterality: N/A;  . CATARACT EXTRACTION    . CHOLECYSTECTOMY    . CORONARY ARTERY BYPASS GRAFT N/A 04/12/2016   Procedure: CORONARY ARTERY BYPASS GRAFTING (CABG) times three using left saphenous vein harvested endoscopically;  Surgeon: Gaye Pollack, MD;  Location: [place] OR;  Service: Open Heart Surgery;  Laterality: N/A;  SEQ SVG-OM1-OM2 SVG-PD  . EYE SURGERY     cataract  . KIDNEY TRANSPLANT  2011  . TEE WITHOUT CARDIOVERSION N/A 04/12/2016   Procedure: TRANSESOPHAGEAL ECHOCARDIOGRAM (TEE);  Surgeon: Gaye Pollack, MD;  Location: [place];  Service: Open Heart Surgery;  Laterality: N/A;  . TONSILLECTOMY    . TRIGGER FINGER RELEASE       Current Outpatient Prescriptions  Medication Sig Dispense Refill  . acetaminophen (TYLENOL) 500 MG tablet Take 500-1,000 mg by mouth every 6 (six) hours as needed (pain).     Marland Kitchen amLODipine (NORVASC) 10 MG tablet Take 10 mg by mouth daily.    Marland Kitchen aspirin EC 81 MG tablet Take 81 mg by mouth daily.    Marland Kitchen atorvastatin (LIPITOR) 20 MG tablet Take 20 mg  by mouth daily.    . clopidogrel (PLAVIX) 75 MG tablet Take 75 mg by mouth daily.    Marland Kitchen dexlansoprazole (DEXILANT) 60 MG capsule Take 60 mg by mouth daily.    . dorzolamide-timolol (COSOPT) 22.3-6.8 MG/ML ophthalmic solution Place 1 drop into both eyes 2 (two) times daily.    Marland Kitchen gabapentin (NEURONTIN) 300 MG capsule Take 1 capsule (300 mg total) by mouth daily.    . insulin aspart (NOVOLOG FLEXPEN) 100 UNIT/ML FlexPen Inject 10-30 Units into the skin 3 (three) times daily with meals. Based on CBG or carb count    . Insulin Detemir (LEVEMIR FLEXTOUCH) 100 UNIT/ML Pen Inject 20 Units into the skin daily. 15 mL 11  . isosorbide mononitrate (IMDUR) 60 MG 24 hr tablet Take 60 mg by mouth daily.    . magnesium oxide (MAG-OX) 400 MG tablet  Take 400 mg by mouth 2 (two) times daily.    . metoprolol succinate (TOPROL-XL) 100 MG 24 hr tablet Take 100 mg by mouth daily. Take with or immediately following a meal.    . mycophenolate (MYFORTIC) 180 MG EC tablet Take 360 mg by mouth 2 (two) times daily.     Marland Kitchen oxyCODONE-acetaminophen (PERCOCET) 7.5-325 MG tablet Take one or two tablet every 4-6 hours PRN severe pain. 28 tablet 0  . prednisoLONE acetate (PRED FORTE) 1 % ophthalmic suspension Place 1 drop into the left eye 4 (four) times daily as needed (itching).    . predniSONE (DELTASONE) 5 MG tablet Take 5 mg by mouth daily.      . sodium bicarbonate 650 MG tablet Take 650 mg by mouth 2 (two) times daily.    Marland Kitchen sulfamethoxazole-trimethoprim (BACTRIM,SEPTRA) 400-80 MG per tablet Take 1 tablet by mouth every Monday, Wednesday, and Friday.     . tacrolimus (PROGRAF) 1 MG capsule Take 2-3 mg by mouth See admin instructions. Take 3 capsules (3 mg) by mouth every morning and 2 capsules (2 mg) at night     No current facility-administered medications for this visit.     Allergies:   Iodinated diagnostic agents; Iron; Promethazine; and Wellbutrin [bupropion hcl]    ROS:  Please see the history of present illness.   Otherwise, review of systems are positive for none.   All other systems are reviewed and negative.    PHYSICAL EXAM: VS:  BP (!) 149/54   Pulse (!) 53   Ht 5\' 4"  (1.626 m)   Wt 223 lb (101.2 kg)   BMI 38.28 kg/m  , BMI Body mass index is 38.28 kg/m. GENERAL:  Well appearing HEENT:  Left eye blind.  NECK:  No jugular venous distention, waveform within normal limits, carotid upstroke brisk and symmetric, no bruits, no thyromegaly LYMPHATICS:  No cervical, inguinal adenopathy LUNGS:  Clear to auscultation bilaterally CHEST:  Well healed sternotomy scar. HEART:  PMI not displaced or sustained,S1 and S2 within normal limits, no S3, no S4, no clicks, no rubs, 3/6 apical systolic murmur without diastolic murmurs ABD:  Flat,  positive bowel sounds normal in frequency in pitch, no bruits, no rebound, no guarding, no midline pulsatile mass, no hepatomegaly, no splenomegaly EXT:  2 plus pulses throughout, mild leg edema, no cyanosis no clubbing   EKG:  EKG is not ordered today.    Recent Labs: 04/11/2016: ALT 26 04/13/2016: Magnesium 2.4 08/12/2016: BUN 28; Creatinine, Ser 1.68; Hemoglobin 11.3; Platelets 230; Potassium 4.8; Sodium 139    Lipid Panel    Component Value Date/Time   CHOL  149 04/08/2016 0345   TRIG 71 04/08/2016 0345   HDL 61 04/08/2016 0345   CHOLHDL 2.4 04/08/2016 0345   VLDL 14 04/08/2016 0345   LDLCALC 74 04/08/2016 0345      Wt Readings from Last 3 Encounters:  08/16/16 223 lb (101.2 kg)  08/12/16 220 lb (99.8 kg)  05/22/16 199 lb (90.3 kg)      Other studies Reviewed: Additional studies/ records that were reviewed today include: ED records.. Review of the above records demonstrates:  Please see elsewhere in the note.     ASSESSMENT AND PLAN:  CABG x 3 -Nov 2017 SVG-OM1-OM2, SVG-PD. 40% native LAD, normal LVF by echo.  She will remain on meds as listed and I will refer her to cardiac rehab.  Status post renal transplant S/P renal transplant at [facility]   Followed by Dr Joesph July in [facility], SCr 2.13 on 04/19/16 but 1.68 in the ED earlier this week.  No change in therapy is planned.    Diabetes mellitus type 2 in obese (HCC) Last A1C was 8.3.  I will defer to Guadlupe Spanish, MD  Dyslipidemia She will remain on the statin as listed and could have follow up ordered at her next visit.   Essential hypertension BP is slightly elevated.  However this is not usual.  No change in therapy is planned.   Current medicines are reviewed at length with the patient today.  The patient does not have concerns regarding medicines.  The following changes have been made:  no change  Labs/ tests ordered today include: None  Orders Placed This Encounter  Procedures  . AMB referral  to cardiac rehabilitation     Disposition:   FU with APP in 3 months.       Signed, Minus Breeding, MD  08/16/2016 1:32 PM    [facility]

## 2016-08-16 ENCOUNTER — Ambulatory Visit (INDEPENDENT_AMBULATORY_CARE_PROVIDER_SITE_OTHER): Payer: Medicaid Other | Admitting: Cardiology

## 2016-08-16 ENCOUNTER — Encounter: Payer: Self-pay | Admitting: Cardiology

## 2016-08-16 VITALS — BP 149/54 | HR 53 | Ht 64.0 in | Wt 223.0 lb

## 2016-08-16 DIAGNOSIS — M7989 Other specified soft tissue disorders: Secondary | ICD-10-CM

## 2016-08-16 DIAGNOSIS — M79604 Pain in right leg: Secondary | ICD-10-CM | POA: Diagnosis not present

## 2016-08-16 DIAGNOSIS — Z951 Presence of aortocoronary bypass graft: Secondary | ICD-10-CM | POA: Diagnosis not present

## 2016-08-16 NOTE — Patient Instructions (Addendum)
Medication Instructions:  Continue current medications  Labwork: None Ordered  Testing/Procedures: None Ordered  Follow-Up: You have been referred to    Your physician recommends that you schedule a follow-up appointment in: 3 Months with Kerin Ransom   Any Other Special Instructions Will Be Listed Below (If Applicable).   If you need a refill on your cardiac medications before your next appointment, please call your pharmacy.

## 2016-08-26 ENCOUNTER — Telehealth (HOSPITAL_COMMUNITY): Payer: Self-pay | Admitting: *Deleted

## 2016-08-26 NOTE — Telephone Encounter (Signed)
Received referral for pt to participate in cardiac rehab here at [facility]. During pt hospitalization, pt indicated that she would like to participate in CR at [facility].  Called pt to determine what her preference would be.  Pt indicated that she would like to participate here at [facility].  Will send Medicaid order to Dr. Percival Spanish.  Once pt is deemed eligible to participate in cardiac rehab she will be contacted and signed up for exercise. Cherre Huger, BSN Cardiac and Training and development officer

## 2016-09-13 ENCOUNTER — Telehealth (HOSPITAL_COMMUNITY): Payer: Self-pay | Admitting: Internal Medicine

## 2016-09-13 NOTE — Telephone Encounter (Signed)
Medicaid reimbursement form faxed to Dr. Percival Spanish on 09/13/16 for determination of eligibility to attend Cardiac Rehab.

## 2016-09-16 ENCOUNTER — Telehealth (HOSPITAL_COMMUNITY): Payer: Self-pay | Admitting: Internal Medicine

## 2016-09-16 NOTE — Telephone Encounter (Signed)
Verified Medicaid Insurance Benefits through [employer]. Reference [phone]... KJ

## 2016-09-20 ENCOUNTER — Telehealth: Payer: Self-pay | Admitting: *Deleted

## 2016-09-20 NOTE — Telephone Encounter (Signed)
Orders for phase 2 faxed

## 2016-09-30 ENCOUNTER — Telehealth (HOSPITAL_COMMUNITY): Payer: Self-pay | Admitting: Internal Medicine

## 2016-10-16 ENCOUNTER — Telehealth (HOSPITAL_COMMUNITY): Payer: Self-pay | Admitting: *Deleted

## 2016-10-16 NOTE — Telephone Encounter (Signed)
Pt called and left a message indicating she needed to cancel cardiac rehab.  Called pt back to clarify what she intended.  Pt has hurt her foot and will require physical therapy.  Indicated to pt that because she has medicaid she will need to start cardiac rehab within 6 months of her cardiac event.  Pt stated that her heart surgery was on 04/12/2016.  Indicated to pt that she would be ineligible to participate in cardiac rehab due to her appt falling outside the medicaid range. Pt verbalized that she understood. Cherre Huger, BSN Cardiac and Training and development officer

## 2016-10-22 ENCOUNTER — Ambulatory Visit (HOSPITAL_COMMUNITY): Payer: Medicaid Other

## 2016-10-28 ENCOUNTER — Ambulatory Visit (HOSPITAL_COMMUNITY): Payer: Medicaid Other

## 2016-10-30 ENCOUNTER — Ambulatory Visit (HOSPITAL_COMMUNITY): Payer: Medicaid Other

## 2016-11-01 ENCOUNTER — Ambulatory Visit (HOSPITAL_COMMUNITY): Payer: Medicaid Other

## 2016-11-06 ENCOUNTER — Ambulatory Visit (HOSPITAL_COMMUNITY): Payer: Medicaid Other

## 2016-11-08 ENCOUNTER — Ambulatory Visit (HOSPITAL_COMMUNITY): Payer: Medicaid Other

## 2016-11-11 ENCOUNTER — Ambulatory Visit (HOSPITAL_COMMUNITY): Payer: Medicaid Other

## 2016-11-13 ENCOUNTER — Ambulatory Visit (HOSPITAL_COMMUNITY): Payer: Medicaid Other

## 2016-11-15 ENCOUNTER — Ambulatory Visit (HOSPITAL_COMMUNITY): Payer: Medicaid Other

## 2016-11-18 ENCOUNTER — Ambulatory Visit (HOSPITAL_COMMUNITY): Payer: Medicaid Other

## 2016-11-20 ENCOUNTER — Ambulatory Visit (HOSPITAL_COMMUNITY): Payer: Medicaid Other

## 2016-11-22 ENCOUNTER — Ambulatory Visit (HOSPITAL_COMMUNITY): Payer: Medicaid Other

## 2016-11-25 ENCOUNTER — Ambulatory Visit (HOSPITAL_COMMUNITY): Payer: Medicaid Other

## 2016-11-27 ENCOUNTER — Ambulatory Visit (HOSPITAL_COMMUNITY): Payer: Medicaid Other

## 2016-11-28 ENCOUNTER — Ambulatory Visit (INDEPENDENT_AMBULATORY_CARE_PROVIDER_SITE_OTHER): Payer: Medicaid Other | Admitting: Cardiology

## 2016-11-28 ENCOUNTER — Encounter: Payer: Self-pay | Admitting: Cardiology

## 2016-11-28 VITALS — BP 146/70 | HR 52 | Ht 64.0 in | Wt 226.0 lb

## 2016-11-28 DIAGNOSIS — I10 Essential (primary) hypertension: Secondary | ICD-10-CM

## 2016-11-28 DIAGNOSIS — E669 Obesity, unspecified: Secondary | ICD-10-CM

## 2016-11-28 DIAGNOSIS — E1169 Type 2 diabetes mellitus with other specified complication: Secondary | ICD-10-CM | POA: Diagnosis not present

## 2016-11-28 DIAGNOSIS — E785 Hyperlipidemia, unspecified: Secondary | ICD-10-CM | POA: Diagnosis not present

## 2016-11-28 DIAGNOSIS — N183 Chronic kidney disease, stage 3 unspecified: Secondary | ICD-10-CM

## 2016-11-28 DIAGNOSIS — Z951 Presence of aortocoronary bypass graft: Secondary | ICD-10-CM | POA: Diagnosis not present

## 2016-11-28 DIAGNOSIS — Z940 Kidney transplant status: Secondary | ICD-10-CM

## 2016-11-28 MED ORDER — ATORVASTATIN CALCIUM 20 MG PO TABS: 20.0000 mg | ORAL_TABLET | Freq: Every day | ORAL | 6 refills | Status: DC

## 2016-11-28 MED ORDER — AMLODIPINE BESYLATE 10 MG PO TABS: 10.0000 mg | ORAL_TABLET | Freq: Every day | ORAL | 6 refills | Status: AC

## 2016-11-28 MED ORDER — METOPROLOL SUCCINATE ER 100 MG PO TB24: 100.0000 mg | ORAL_TABLET | Freq: Every day | ORAL | 6 refills | Status: DC

## 2016-11-28 MED ORDER — CLOPIDOGREL BISULFATE 75 MG PO TABS: 75.0000 mg | ORAL_TABLET | Freq: Every day | ORAL | 6 refills | Status: DC

## 2016-11-28 MED ORDER — ISOSORBIDE MONONITRATE ER 60 MG PO TB24: 60.0000 mg | ORAL_TABLET | Freq: Every day | ORAL | 11 refills | Status: DC

## 2016-11-28 NOTE — Patient Instructions (Signed)
Medication Instructions:  Your physician recommends that you continue on your current medications as directed. Please refer to the Current Medication list given to you today.  Labwork: NONE   Testing/Procedures: NONE   Follow-Up: Your physician wants you to follow-up in: [place]. You will receive a reminder letter in the mail two months in advance. If you don't receive a letter, please call our office to schedule the follow-up appointment.  Any Other Special Instructions Will Be Listed Below (If Applicable).  MEDICATION REFILLS HAVE BEEN SENT TO THE PHARMACY   If you need a refill on your cardiac medications before your next appointment, please call your pharmacy.

## 2016-11-28 NOTE — Assessment & Plan Note (Signed)
SVG-OM1-OM2, SVG-PD. 40% native LAD, normal LVF by echo Pt doing well though she ran out of Imdur
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
## 2016-11-29 ENCOUNTER — Ambulatory Visit (HOSPITAL_COMMUNITY): Payer: Medicaid Other

## 2016-12-02 ENCOUNTER — Ambulatory Visit (HOSPITAL_COMMUNITY): Payer: Medicaid Other

## 2016-12-04 ENCOUNTER — Ambulatory Visit (HOSPITAL_COMMUNITY): Payer: Medicaid Other

## 2016-12-06 ENCOUNTER — Ambulatory Visit (HOSPITAL_COMMUNITY): Payer: Medicaid Other

## 2016-12-09 ENCOUNTER — Ambulatory Visit (HOSPITAL_COMMUNITY): Payer: Medicaid Other

## 2016-12-13 ENCOUNTER — Ambulatory Visit (HOSPITAL_COMMUNITY): Payer: Medicaid Other

## 2016-12-16 ENCOUNTER — Ambulatory Visit (HOSPITAL_COMMUNITY): Payer: Medicaid Other

## 2016-12-18 ENCOUNTER — Ambulatory Visit (HOSPITAL_COMMUNITY): Payer: Medicaid Other

## 2016-12-20 ENCOUNTER — Ambulatory Visit (HOSPITAL_COMMUNITY): Payer: Medicaid Other

## 2016-12-23 ENCOUNTER — Ambulatory Visit (HOSPITAL_COMMUNITY): Payer: Medicaid Other

## 2016-12-25 ENCOUNTER — Ambulatory Visit (HOSPITAL_COMMUNITY): Payer: Medicaid Other

## 2016-12-27 ENCOUNTER — Ambulatory Visit (HOSPITAL_COMMUNITY): Payer: Medicaid Other

## 2016-12-30 ENCOUNTER — Ambulatory Visit (HOSPITAL_COMMUNITY): Payer: Medicaid Other

## 2017-01-01 ENCOUNTER — Ambulatory Visit (HOSPITAL_COMMUNITY): Payer: Medicaid Other

## 2017-01-03 ENCOUNTER — Ambulatory Visit (HOSPITAL_COMMUNITY): Payer: Medicaid Other

## 2017-01-06 ENCOUNTER — Ambulatory Visit (HOSPITAL_COMMUNITY): Payer: Medicaid Other

## 2017-01-08 ENCOUNTER — Ambulatory Visit (HOSPITAL_COMMUNITY): Payer: Medicaid Other

## 2017-01-10 ENCOUNTER — Ambulatory Visit (HOSPITAL_COMMUNITY): Payer: Medicaid Other

## 2017-01-13 ENCOUNTER — Ambulatory Visit (HOSPITAL_COMMUNITY): Payer: Medicaid Other

## 2017-01-15 ENCOUNTER — Ambulatory Visit (HOSPITAL_COMMUNITY): Payer: Medicaid Other

## 2017-03-18 ENCOUNTER — Telehealth: Payer: Self-pay | Admitting: *Deleted

## 2017-03-18 NOTE — Telephone Encounter (Addendum)
Requesting surgical clearance:   1. Type of surgery: Tube Shunt Implant  2. Surgeon: Ronney Lion, MD  3. Surgical date: 04/01/2017  4. Medications that need to be help: Plavix 5 days Aspirin 7 days  5. [place]: [phone]

## 2017-03-18 NOTE — Telephone Encounter (Deleted)
How long should pt hold Plavix and Aspirin

## 2017-03-24 NOTE — Telephone Encounter (Signed)
Given the low risk nature of the procedure, the absence of acute symptoms and the fact that she had revascularization last year no further testing is indicated prior to her procedure that would alter her surgical risk.  She should proceed with surgery with an understanding that she has moderate cardiovascular risk.  She can stop her Plavix.  Does not need to resume.

## 2017-03-24 NOTE — Telephone Encounter (Signed)
Note faxed to Dr. Edilia Bo via epic fax by Robbie Lis, PA-C. Dayna Dunn PA-C

## 2017-03-24 NOTE — Telephone Encounter (Signed)
    Chart reviewed as part of pre-operative protocol coverage. Patient contaced 03/24/2017 in reference to pre-operative risk assessment for pending surgery as outlined below.  April Pena was last seen by  April Pena on 11/28/16.  Since that day, April Pena has done [facility]. Seems patient has poor functional capacity and only able to do light house hold work. No recent use of nitro. She does have occasional chest discomfort that relieves with layding down. Per RCRI risk is 11%. [facility] activity status index is 3.84 METS. Will route to April Pena prior to clearance to get his input and also need guidance regarding anti platelets medications. She is unsure regarding type of anesthesia needed for surgery (has appointment tomorrow with surgeon).   [name], PA 03/24/2017, 2:12 PM

## 2017-03-25 NOTE — Telephone Encounter (Signed)
Spoke with patient and recited advice from  Dr. Percival Spanish including instructions to stop Plavix and do not resume after surgery. Patient states surgeon does want her to hold aspirin and was waiting for approval from Dr. Percival Spanish. I advised patient that she may hold per surgeon recommendation and that she should resume aspirin as soon as surgeon indicates it is safe. Patient verbalized understanding and agreement with plan and thanked me for the call.

## 2017-03-25 NOTE — Telephone Encounter (Signed)
   Chart reviewed as part of pre-operative protocol coverage. Please see phone notes below. Per Dr. Rosezella Florida final commentary, "Given the low risk nature of the procedure, the absence of acute symptoms and the fact that she had revascularization last year no further testing is indicated prior to her procedure that would alter her surgical risk.  She should proceed with surgery with an understanding that she has moderate cardiovascular risk.  She can stop her Plavix.  Does not need to resume." Regarding aspirin, he states "If the procedure cannot be done on ASA she can hold the ASA as well."  I will route this note to the requested fax.  Will also route to call back pool for nurse to call patient to inform her she can proceed with surgery with understanding that she has moderate cardiovascular risk, and that Dr. Percival Spanish feels she can stop her Plavix completely.   Charlie Pitter, PA-C 03/25/2017, 1:38 PM

## 2017-03-25 NOTE — Telephone Encounter (Signed)
Dr. Doree Albee office calling, Elayne Snare PA, saying she hasn't heard from [place] regarding pt's Plavix or ASA, I see fax was sent yesterday and she doesn't have it, can we resend, also needs to know if pt can be off ASA for 7 days--fax [phone]

## 2017-03-25 NOTE — Telephone Encounter (Signed)
If the procedure cannot be done on ASA she can hold the ASA as well.

## 2017-03-25 NOTE — Telephone Encounter (Signed)
Faxed to sonja PA again thru Epic-she is notified that this was sent

## 2017-03-25 NOTE — Telephone Encounter (Signed)
Dr. Percival Spanish, can this patient be off aspirin for 7 days? You responded yesterday regarding the Plavix. Please Quicknote your response to P CV DIV PREPO. Thanks. Dayna Dunn PA-C

## 2017-03-25 NOTE — Telephone Encounter (Signed)
Will also cc back to pre-op pool. Tyan Dy PA-C

## 2017-05-07 ENCOUNTER — Other Ambulatory Visit: Payer: Self-pay | Admitting: Cardiology

## 2017-05-10 ENCOUNTER — Other Ambulatory Visit: Payer: Self-pay

## 2017-05-10 ENCOUNTER — Emergency Department (HOSPITAL_BASED_OUTPATIENT_CLINIC_OR_DEPARTMENT_OTHER)
Admission: EM | Admit: 2017-05-10 | Discharge: 2017-05-10 | Disposition: A | Discharge status: Home / Self Care | Payer: Medicaid Other | Attending: Emergency Medicine | Admitting: Emergency Medicine

## 2017-05-10 ENCOUNTER — Encounter (HOSPITAL_BASED_OUTPATIENT_CLINIC_OR_DEPARTMENT_OTHER): Payer: Self-pay | Admitting: Emergency Medicine

## 2017-05-10 DIAGNOSIS — E162 Hypoglycemia, unspecified: Secondary | ICD-10-CM

## 2017-05-10 DIAGNOSIS — N183 Chronic kidney disease, stage 3 (moderate): Secondary | ICD-10-CM | POA: Diagnosis not present

## 2017-05-10 DIAGNOSIS — Z7982 Long term (current) use of aspirin: Secondary | ICD-10-CM | POA: Diagnosis not present

## 2017-05-10 DIAGNOSIS — Z79899 Other long term (current) drug therapy: Secondary | ICD-10-CM | POA: Insufficient documentation

## 2017-05-10 DIAGNOSIS — Z794 Long term (current) use of insulin: Secondary | ICD-10-CM | POA: Insufficient documentation

## 2017-05-10 DIAGNOSIS — Z940 Kidney transplant status: Secondary | ICD-10-CM | POA: Insufficient documentation

## 2017-05-10 DIAGNOSIS — I509 Heart failure, unspecified: Secondary | ICD-10-CM | POA: Insufficient documentation

## 2017-05-10 DIAGNOSIS — Z951 Presence of aortocoronary bypass graft: Secondary | ICD-10-CM | POA: Diagnosis not present

## 2017-05-10 DIAGNOSIS — R7989 Other specified abnormal findings of blood chemistry: Secondary | ICD-10-CM

## 2017-05-10 DIAGNOSIS — E1122 Type 2 diabetes mellitus with diabetic chronic kidney disease: Secondary | ICD-10-CM | POA: Diagnosis not present

## 2017-05-10 DIAGNOSIS — Z87891 Personal history of nicotine dependence: Secondary | ICD-10-CM | POA: Insufficient documentation

## 2017-05-10 DIAGNOSIS — E11649 Type 2 diabetes mellitus with hypoglycemia without coma: Secondary | ICD-10-CM | POA: Diagnosis not present

## 2017-05-10 DIAGNOSIS — I130 Hypertensive heart and chronic kidney disease with heart failure and stage 1 through stage 4 chronic kidney disease, or unspecified chronic kidney disease: Secondary | ICD-10-CM | POA: Insufficient documentation

## 2017-05-10 DIAGNOSIS — G43009 Migraine without aura, not intractable, without status migrainosus: Secondary | ICD-10-CM | POA: Diagnosis not present

## 2017-05-10 DIAGNOSIS — R51 Headache: Secondary | ICD-10-CM | POA: Diagnosis present

## 2017-05-10 DIAGNOSIS — I252 Old myocardial infarction: Secondary | ICD-10-CM | POA: Insufficient documentation

## 2017-05-10 LAB — CBG MONITORING, ED
GLUCOSE-CAPILLARY: 45 mg/dL — AB (ref 65–99)
Glucose-Capillary: 124 mg/dL — ABNORMAL HIGH (ref 65–99)
Glucose-Capillary: 33 mg/dL — CL (ref 65–99)
Glucose-Capillary: 118 mg/dL — ABNORMAL HIGH (ref 65–99)
Glucose-Capillary: 113 mg/dL — ABNORMAL HIGH (ref 65–99)

## 2017-05-10 LAB — COMPREHENSIVE METABOLIC PANEL
ALK PHOS: 102 U/L (ref 38–126)
ALT: 50 U/L (ref 14–54)
AST: 35 U/L (ref 15–41)
Albumin: 4.6 g/dL (ref 3.5–5.0)
Anion gap: 6 (ref 5–15)
BILIRUBIN TOTAL: 0.8 mg/dL (ref 0.3–1.2)
BUN: 29 mg/dL — AB (ref 6–20)
CO2: 24 mmol/L (ref 22–32)
CREATININE: 2.37 mg/dL — AB (ref 0.44–1.00)
Calcium: 8.6 mg/dL — ABNORMAL LOW (ref 8.9–10.3)
Chloride: 109 mmol/L (ref 101–111)
GFR calc Af Amer: 24 mL/min — ABNORMAL LOW (ref >60)
GFR, EST NON AFRICAN AMERICAN: 21 mL/min — AB (ref >60)
GLUCOSE: 44 mg/dL — AB (ref 65–99)
Potassium: 4.4 mmol/L (ref 3.5–5.1)
Sodium: 139 mmol/L (ref 135–145)
TOTAL PROTEIN: 7.8 g/dL (ref 6.5–8.1)

## 2017-05-10 LAB — CBC WITH DIFFERENTIAL/PLATELET
BASOS ABS: 0 10*3/uL (ref 0.0–0.1)
Basophils Relative: 0 %
Eosinophils Absolute: 0.1 10*3/uL (ref 0.0–0.7)
Eosinophils Relative: 2 %
HEMATOCRIT: 33.5 % — AB (ref 36.0–46.0)
HEMOGLOBIN: 10.5 g/dL — AB (ref 12.0–15.0)
LYMPHS PCT: 25 %
Lymphs Abs: 1.3 10*3/uL (ref 0.7–4.0)
MCH: 27.9 pg (ref 26.0–34.0)
MCHC: 31.3 g/dL (ref 30.0–36.0)
MCV: 89.1 fL (ref 78.0–100.0)
MONO ABS: 0.8 10*3/uL (ref 0.1–1.0)
Monocytes Relative: 15 %
NEUTROS ABS: 3 10*3/uL (ref 1.7–7.7)
NEUTROS PCT: 58 %
Platelets: 230 10*3/uL (ref 150–400)
RBC: 3.76 MIL/uL — AB (ref 3.87–5.11)
RDW: 13.7 % (ref 11.5–15.5)
WBC: 5.2 10*3/uL (ref 4.0–10.5)

## 2017-05-10 LAB — TROPONIN I: Troponin I: <0.03 ng/mL (ref <0.03)

## 2017-05-10 MED ORDER — KETOROLAC TROMETHAMINE 30 MG/ML IJ SOLN
30.0000 mg | Freq: Once | INTRAMUSCULAR | Status: AC
  Administered 2017-05-10: 30 mg via INTRAVENOUS
  Filled 2017-05-10: qty 1

## 2017-05-10 MED ORDER — DOCUSATE SODIUM 100 MG PO CAPS: 100.0000 mg | ORAL_CAPSULE | Freq: Two times a day (BID) | ORAL | 0 refills | Status: DC

## 2017-05-10 MED ORDER — DEXTROSE 50 % IV SOLN
25.0000 mL | Freq: Once | INTRAVENOUS | Status: AC
Start: 2017-05-10 — End: 2017-05-10
  Administered 2017-05-10: 25 mL via INTRAVENOUS
  Filled 2017-05-10: qty 50
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
## 2017-05-10 NOTE — ED Provider Notes (Signed)
[facility] EMERGENCY DEPARTMENT Provider Note   CSN: [id number] Arrival date & time: 05/10/17  1824     History   Chief Complaint Chief Complaint  Patient presents with  . Headache    HPI April Pena is a 61 y.o. female.  Patient is a 61 year old female with a history of CHF, diabetes, glaucoma, left eye blindness, kidney transplant, hypertension, hyperlipidemia who presents with a headache.  She states that she woke up about 4:00 this afternoon with a gradually worsening bifrontal type headache.  She had a history of migraines in the past with the last one being about a year ago.  She states her headache feels similar to her prior migraines.  She has had some nausea but no vomiting.  She has a sensation that her ears are full like she is underwater but she states that this is been going on for several months and has been seen by her PCP.  She also notes that she has some congestion in her throat which she feels like makes it hard to swallow but this also has been going on for about 4-5 months and is unchanged from her baseline.  She has blindness in her left eye and vision loss in her right eye which is unchanged from her baseline.  She denies any speech deficits.  No numbness to her face.  No numbness or weakness to her extremities.  No facial drooping.  No recent head trauma.  No fevers.  No neck pain.  She was noted in triage to be hypoglycemic.  She states that she took her insulin this morning but has not eaten anything today since breakfast.  She has not taken any of her insulin since this morning.  She states with her prior migrainous type headache she normally had to get her shot of her pill and the headache would improve.  She denies any unusual symptoms with this headache.      Past Medical History:  Diagnosis Date  . Blind left eye   . Blood transfusion without reported diagnosis   . CHF (congestive heart failure) ([place])   . Diabetes mellitus   . Diabetes  mellitus type 2 in obese ([place]) 04/11/2016  . Glaucoma   . Glaucoma   . Groin hematoma, initial encounter 04/11/2016  . Hyperlipidemia 04/11/2016  . Hypertension   . Hypertensive heart disease 04/11/2016  . MI (myocardial infarction) ([place])   . Renal disorder    kidney transplant  . Unstable angina [facility])     Patient Active Problem List   Diagnosis Date Noted  . S/p cadaver renal transplant 2011 04/17/2016  . Chronic renal disease, stage III s/p transplant 04/17/2016  . Acute on chronic renal insufficiency post CABG 04/17/2016  . 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 Essential hypertension 04/11/2016  . Dyslipidemia 04/11/2016  . Groin hematoma, initial encounter 04/11/2016  . Diabetes mellitus type 2 in obese ([place]) 04/11/2016  . NSTEMI (non-ST elevated myocardial infarction) ([place]) 04/07/2016  . Low back pain radiating to right leg 04/04/2011    Past Surgical History:  Procedure Laterality Date  . ABDOMINAL HYSTERECTOMY    . CARDIAC CATHETERIZATION N/A 04/08/2016   Procedure: Left Heart Cath and Coronary Angiography;  Surgeon: Burnell Blanks, MD;  Location: [facility];  Service: Cardiovascular;  Laterality: N/A;  . CATARACT EXTRACTION    . CHOLECYSTECTOMY    . CORONARY ARTERY BYPASS GRAFT N/A 04/12/2016   Procedure: CORONARY ARTERY BYPASS GRAFTING (CABG) times  three using left saphenous vein harvested endoscopically;  Surgeon: Gaye Pollack, MD;  Location: [facility];  Service: Open Heart Surgery;  Laterality: N/A;  SEQ SVG-OM1-OM2 SVG-PD  . EYE SURGERY     cataract  . KIDNEY TRANSPLANT  2011  . TEE WITHOUT CARDIOVERSION N/A 04/12/2016   Procedure: TRANSESOPHAGEAL ECHOCARDIOGRAM (TEE);  Surgeon: Gaye Pollack, MD;  Location: [facility];  Service: Open Heart Surgery;  Laterality: N/A;  . TONSILLECTOMY    . TRIGGER FINGER RELEASE      OB History    No data available       Home Medications    Prior to Admission medications   Medication Sig Start Date End Date Taking?  Authorizing Provider  acetaminophen (TYLENOL) 500 MG tablet Take 500-1,000 mg by mouth every 6 (six) hours as needed (pain).     [provider]  amLODipine (NORVASC) 10 MG tablet Take 1 tablet (10 mg total) by mouth daily. 11/28/16   Erlene Quan, PA-C  aspirin EC 81 MG tablet Take 81 mg by mouth daily.    [provider]  atorvastatin (LIPITOR) 20 MG tablet TAKE 1 TABLET BY MOUTH ONCE DAILY 05/08/17   Lelon Perla, MD  dexlansoprazole (DEXILANT) 60 MG capsule Take 60 mg by mouth daily.    [provider]  dorzolamide-timolol (COSOPT) 22.3-6.8 MG/ML ophthalmic solution Place 1 drop into both eyes 2 (two) times daily.    [provider]  gabapentin (NEURONTIN) 300 MG capsule Take 1 capsule (300 mg total) by mouth daily. 04/19/16 12/27/29  Nani Skillern, PA-C  insulin aspart (NOVOLOG FLEXPEN) 100 UNIT/ML FlexPen Inject 10-30 Units into the skin 3 (three) times daily with meals. Based on CBG or carb count    [provider]  Insulin Detemir (LEVEMIR FLEXTOUCH) 100 UNIT/ML Pen Inject 20 Units into the skin daily. 04/19/16   Nani Skillern, PA-C  isosorbide mononitrate (IMDUR) 60 MG 24 hr tablet Take 1 tablet (60 mg total) by mouth daily. 11/28/16   Erlene Quan, PA-C  magnesium oxide (MAG-OX) 400 MG tablet Take 400 mg by mouth 2 (two) times daily.    [provider]  metoprolol succinate (TOPROL-XL) 100 MG 24 hr tablet Take 1 tablet (100 mg total) by mouth daily. Take with or immediately following a meal. 11/28/16   Kilroy, Doreene Burke, PA-C  mycophenolate (MYFORTIC) 180 MG EC tablet Take 360 mg by mouth 2 (two) times daily.     [provider]  oxyCODONE-acetaminophen (PERCOCET) 7.5-325 MG tablet Take one or two tablet every 4-6 hours PRN severe pain. 04/19/16   Nani Skillern, PA-C  prednisoLONE acetate (PRED FORTE) 1 % ophthalmic suspension Place 1 drop into the left eye 4 (four) times daily as needed (itching).     [provider]  predniSONE (DELTASONE) 5 MG tablet Take 5 mg by mouth daily.      [provider]  sodium bicarbonate 650 MG tablet Take 650 mg by mouth 2 (two) times daily.    [provider]  sulfamethoxazole-trimethoprim (BACTRIM,SEPTRA) 400-80 MG per tablet Take 1 tablet by mouth every Monday, Wednesday, and Friday.     [provider]  tacrolimus (PROGRAF) 1 MG capsule Take 2-3 mg by mouth See admin instructions. Take 3 capsules (3 mg) by mouth every morning and 2 capsules (2 mg) at night    [provider]    Family History Family History  Problem Relation Age of Onset  . Diabetes  Mother   . Hypertension Mother   . Heart attack Mother   . Diabetes Father   . Hypertension Sister   . Diabetes Brother   . Hypertension Brother   . Diabetes Paternal Grandmother   . Diabetes Daughter   . Hypertension Daughter   . Hyperlipidemia Neg Hx   . Sudden death Neg Hx     Social History Social History   Tobacco Use  . Smoking status: Former Research scientist (life sciences)  . Smokeless tobacco: Never Used  . Tobacco comment: Years ago  Substance Use Topics  . Alcohol use: No  . Drug use: No     Allergies   Iodinated diagnostic agents; Iron; Promethazine; and Wellbutrin [bupropion hcl]   Review of Systems Review of Systems  Constitutional: Negative for chills, diaphoresis, fatigue and fever.  HENT: Negative for congestion, ear discharge, ear pain, rhinorrhea and sneezing.   Eyes: Negative.   Respiratory: Negative for cough, chest tightness and shortness of breath.   Cardiovascular: Negative for chest pain and leg swelling.  Gastrointestinal: Negative for abdominal pain, blood in stool, diarrhea, nausea and vomiting.  Genitourinary: Negative for difficulty urinating, flank pain, frequency and hematuria.  Musculoskeletal: Negative for arthralgias and back pain.  Skin: Negative for rash.  Neurological: Positive for headaches. Negative for dizziness, speech  difficulty, weakness and numbness.     Physical Exam Updated Vital Signs BP (!) 141/81   Pulse (!) 47   Temp 98 F (36.7 C) (Oral)   Resp 17   Ht 5\' 4"  (1.626 m)   Wt 104.3 kg (230 lb)   SpO2 98%   BMI 39.48 kg/m   Physical Exam  Constitutional: She is oriented to person, place, and time. She appears well-developed and well-nourished.  HENT:  Head: Normocephalic and atraumatic.  Eyes:  Patient has obliteration of the pupil in the left eye.  There is haziness to both cornea.  Neck: Normal range of motion. Neck supple. No neck rigidity.  Cardiovascular: Normal rate, regular rhythm and normal heart sounds.  Pulmonary/Chest: Effort normal and breath sounds normal. No respiratory distress. She has no wheezes. She has no rales. She exhibits no tenderness.  Abdominal: Soft. Bowel sounds are normal. There is no tenderness. There is no rebound and no guarding.  Musculoskeletal: Normal range of motion. She exhibits no edema.  Lymphadenopathy:    She has no cervical adenopathy.  Neurological: She is alert and oriented to person, place, and time. She has normal strength. No cranial nerve deficit or sensory deficit. Coordination and gait normal. GCS eye subscore is 4. GCS verbal subscore is 5. GCS motor subscore is 6.  Skin: Skin is warm and dry. No rash noted.  Psychiatric: She has a normal mood and affect.     ED Treatments / Results  Labs (all labs ordered are listed, but only abnormal results are displayed) Labs Reviewed  CBC WITH DIFFERENTIAL/PLATELET - Abnormal; Notable for the following components:      Result Value   RBC 3.76 (*)    Hemoglobin 10.5 (*)    HCT 33.5 (*)    All other components within normal limits  COMPREHENSIVE METABOLIC PANEL - Abnormal; Notable for the following components:   Glucose, Bld 44 (*)    BUN 29 (*)    Creatinine, Ser 2.37 (*)    Calcium 8.6 (*)    GFR calc non Af Amer 21 (*)    GFR calc Af Amer 24 (*)    All other components within normal  limits  CBG MONITORING, ED - Abnormal; Notable for the following components:   Glucose-Capillary 124 (*)    All other components within normal limits  CBG MONITORING, ED - Abnormal; Notable for the following components:   Glucose-Capillary 33 (*)    All other components within normal limits  CBG MONITORING, ED - Abnormal; Notable for the following components:   Glucose-Capillary 45 (*)    All other components within normal limits  CBG MONITORING, ED - Abnormal; Notable for the following components:   Glucose-Capillary 118 (*)    All other components within normal limits  CBG MONITORING, ED - Abnormal; Notable for the following components:   Glucose-Capillary 113 (*)    All other components within normal limits  TROPONIN I    EKG  EKG Interpretation  Date/Time:  Saturday May 10 2017 18:42:00 EST Ventricular Rate:  52 PR Interval:  142 QRS Duration: 98 QT Interval:  514 QTC Calculation: 478 R Axis:   30 Text Interpretation:  Sinus bradycardia Septal infarct , age undetermined ST & T wave abnormality, consider lateral ischemia Abnormal ECG Confirmed by Malvin Johns [phone]) on 05/10/2017 8:28:08 PM       Radiology No results found.  Procedures Procedures (including critical care time)  Medications Ordered in ED Medications  dextrose 50 % solution 25 mL (25 mLs Intravenous Given 05/10/17 2045)  ketorolac (TORADOL) 30 MG/ML injection 30 mg (30 mg Intravenous Given 05/10/17 2131)     Initial Impression / Assessment and Plan / ED Course  I have reviewed the triage vital signs and the nursing notes.  Pertinent labs & imaging results that were available during my care of the patient were reviewed by me and considered in my medical decision making (see chart for details).     Patient is a 61 year old female with multiple medical problems to presents with a migraine type headache.  She has had similar headaches in the past.  She denies any unusual symptoms or atypical  type pain.  Her pain resolved after 1 dose of Toradol.  Of note, she was given Toradol prior to her creatinine resulting.  Her creatinine is mildly elevated over her recent prior values.  It is however still in the range that her creatinines have been over the last year or so.  She is status post a kidney transplant.  She states she is taking her medications regularly.  I did advise her that she needs to have close follow-up on this and make sure that her creatinine remained stable or improved.  She also was noted to have hypoglycemia.  She states that she took her insulin shot this morning and has not eaten since breakfast.  She was given half an amp of D50 as well as food and orange juice.  Her glucose has remained stable since that time.  I advised her to keep a close check on her blood sugar at home and return for any worsening symptoms.  Her heart rate is in the 40s and 50s.  She does not appear symptomatic with this.  Her blood pressure is stable.  She is in a sinus bradycardia.  I reviewed her records and her heart rate was in the 40s on her recent endocrinology visit.  This may be baseline for her but I did advise her to have close follow-up with her PCP.  She was given strict return precautions.  She reports some fullness in her ears and trouble with swallowing and that she feels like things get clogged in the  back of her throat.  These both appear to be chronic issues and are unchanged from her baseline.  She does not have any new findings which would be more suggestive of a stroke or intracranial hemorrhage.  She is completely asymptomatic after treatment with Toradol in the ED.  She was discharged home in good condition.  She was advised to have close follow-up with her PCP.  Return precautions were given.  Final Clinical Impressions(s) / ED Diagnoses   Final diagnoses:  Migraine without aura and without status migrainosus, not intractable  Hypoglycemia  Elevated serum creatinine    ED  Discharge Orders    None       Malvin Johns, MD 05/10/17 2306

## 2017-05-10 NOTE — ED Notes (Addendum)
Pt requested glucose check from Nurse 1st RN; CBG 33- charge nurse notified and pt was given 8 oz OJ to drink.

## 2017-05-10 NOTE — ED Triage Notes (Signed)
Per EMS patient from home.  C/o headache started today at 0800 and a "water sensation in ear" x 2-3 days.  Denies head injury, LOC, neuro deficits. States she has a hx of vertigo and this seems similar.

## 2017-05-10 NOTE — ED Notes (Signed)
Assisted to waiting room in a wheelchair to wait on cab.

## 2017-05-10 NOTE — ED Notes (Addendum)
MD aware that blood sugar is 45 after OJ. Orders received. Pt symptomatic, skin clammy and pt states that she just feels "weird" Pt moved to a room. Placed on a monitor. Orders received.

## 2017-05-10 NOTE — Discharge Instructions (Signed)
YOU NEED TO HAVE YOUR CREATININE RECHECKED.  CHECK YOUR BLOOD SUGARS FREQUENTLY.  YOUR HEART RATE WAS LOW, BUT WAS ALSO LOW ON YOUR RECENT DOCTOR VISITS.  RETURN FOR ANY WORSENING SYMPTOMS.

## 2017-05-10 NOTE — ED Triage Notes (Addendum)
Patient reports headache which began at 1600 today.  States that she also feels like she has water in her right ear.  Reports pain to both ears.  Patient alert and oriented at present, speaking without difficulty.  Patient states she has never had symptoms like this previously and states dizziness upon standing.

## 2017-09-03 ENCOUNTER — Inpatient Hospital Stay (HOSPITAL_COMMUNITY)
Admission: EM | Admit: 2017-09-03 | Discharge: 2017-09-05 | DRG: 291 | Disposition: A | Discharge status: Home / Self Care | Payer: Medicaid Other | Attending: Internal Medicine | Admitting: Internal Medicine

## 2017-09-03 ENCOUNTER — Emergency Department (HOSPITAL_COMMUNITY): Payer: Medicaid Other

## 2017-09-03 ENCOUNTER — Other Ambulatory Visit: Payer: Self-pay

## 2017-09-03 ENCOUNTER — Encounter (HOSPITAL_COMMUNITY): Payer: Self-pay | Admitting: Emergency Medicine

## 2017-09-03 DIAGNOSIS — Z8249 Family history of ischemic heart disease and other diseases of the circulatory system: Secondary | ICD-10-CM | POA: Diagnosis not present

## 2017-09-03 DIAGNOSIS — Z794 Long term (current) use of insulin: Secondary | ICD-10-CM

## 2017-09-03 DIAGNOSIS — Z87891 Personal history of nicotine dependence: Secondary | ICD-10-CM | POA: Diagnosis not present

## 2017-09-03 DIAGNOSIS — N186 End stage renal disease: Secondary | ICD-10-CM | POA: Diagnosis present

## 2017-09-03 DIAGNOSIS — E669 Obesity, unspecified: Secondary | ICD-10-CM | POA: Diagnosis present

## 2017-09-03 DIAGNOSIS — R0789 Other chest pain: Secondary | ICD-10-CM | POA: Insufficient documentation

## 2017-09-03 DIAGNOSIS — Z91041 Radiographic dye allergy status: Secondary | ICD-10-CM | POA: Diagnosis not present

## 2017-09-03 DIAGNOSIS — Z7952 Long term (current) use of systemic steroids: Secondary | ICD-10-CM | POA: Diagnosis not present

## 2017-09-03 DIAGNOSIS — Z888 Allergy status to other drugs, medicaments and biological substances status: Secondary | ICD-10-CM

## 2017-09-03 DIAGNOSIS — E1169 Type 2 diabetes mellitus with other specified complication: Secondary | ICD-10-CM | POA: Diagnosis present

## 2017-09-03 DIAGNOSIS — Z951 Presence of aortocoronary bypass graft: Secondary | ICD-10-CM | POA: Diagnosis not present

## 2017-09-03 DIAGNOSIS — N183 Chronic kidney disease, stage 3 (moderate): Secondary | ICD-10-CM | POA: Diagnosis not present

## 2017-09-03 DIAGNOSIS — I10 Essential (primary) hypertension: Secondary | ICD-10-CM | POA: Diagnosis present

## 2017-09-03 DIAGNOSIS — I5031 Acute diastolic (congestive) heart failure: Secondary | ICD-10-CM | POA: Diagnosis not present

## 2017-09-03 DIAGNOSIS — H5462 Unqualified visual loss, left eye, normal vision right eye: Secondary | ICD-10-CM | POA: Diagnosis present

## 2017-09-03 DIAGNOSIS — R001 Bradycardia, unspecified: Secondary | ICD-10-CM | POA: Diagnosis present

## 2017-09-03 DIAGNOSIS — Z6839 Body mass index (BMI) 39.0-39.9, adult: Secondary | ICD-10-CM

## 2017-09-03 DIAGNOSIS — Z940 Kidney transplant status: Secondary | ICD-10-CM | POA: Diagnosis not present

## 2017-09-03 DIAGNOSIS — I132 Hypertensive heart and chronic kidney disease with heart failure and with stage 5 chronic kidney disease, or end stage renal disease: Principal | ICD-10-CM | POA: Diagnosis present

## 2017-09-03 DIAGNOSIS — R3911 Hesitancy of micturition: Secondary | ICD-10-CM | POA: Diagnosis not present

## 2017-09-03 DIAGNOSIS — I2510 Atherosclerotic heart disease of native coronary artery without angina pectoris: Secondary | ICD-10-CM | POA: Diagnosis present

## 2017-09-03 DIAGNOSIS — Z833 Family history of diabetes mellitus: Secondary | ICD-10-CM

## 2017-09-03 DIAGNOSIS — Z9114 Patient's other noncompliance with medication regimen: Secondary | ICD-10-CM

## 2017-09-03 DIAGNOSIS — Z79899 Other long term (current) drug therapy: Secondary | ICD-10-CM | POA: Diagnosis not present

## 2017-09-03 DIAGNOSIS — I5033 Acute on chronic diastolic (congestive) heart failure: Secondary | ICD-10-CM | POA: Diagnosis present

## 2017-09-03 DIAGNOSIS — Z9111 Patient's noncompliance with dietary regimen: Secondary | ICD-10-CM

## 2017-09-03 DIAGNOSIS — I509 Heart failure, unspecified: Secondary | ICD-10-CM

## 2017-09-03 DIAGNOSIS — H409 Unspecified glaucoma: Secondary | ICD-10-CM | POA: Diagnosis present

## 2017-09-03 DIAGNOSIS — I252 Old myocardial infarction: Secondary | ICD-10-CM

## 2017-09-03 DIAGNOSIS — Z91048 Other nonmedicinal substance allergy status: Secondary | ICD-10-CM

## 2017-09-03 DIAGNOSIS — I071 Rheumatic tricuspid insufficiency: Secondary | ICD-10-CM

## 2017-09-03 DIAGNOSIS — E785 Hyperlipidemia, unspecified: Secondary | ICD-10-CM | POA: Diagnosis present

## 2017-09-03 DIAGNOSIS — E1122 Type 2 diabetes mellitus with diabetic chronic kidney disease: Secondary | ICD-10-CM | POA: Diagnosis present

## 2017-09-03 DIAGNOSIS — I130 Hypertensive heart and chronic kidney disease with heart failure and stage 1 through stage 4 chronic kidney disease, or unspecified chronic kidney disease: Secondary | ICD-10-CM

## 2017-09-03 LAB — CBC
HCT: 32.2 % — ABNORMAL LOW (ref 36.0–46.0)
HEMOGLOBIN: 9.7 g/dL — AB (ref 12.0–15.0)
MCH: 26.3 pg (ref 26.0–34.0)
MCHC: 30.1 g/dL (ref 30.0–36.0)
MCV: 87.3 fL (ref 78.0–100.0)
Platelets: 147 10*3/uL — ABNORMAL LOW (ref 150–400)
RBC: 3.69 MIL/uL — AB (ref 3.87–5.11)
RDW: 15 % (ref 11.5–15.5)
WBC: 3.4 10*3/uL — ABNORMAL LOW (ref 4.0–10.5)

## 2017-09-03 LAB — HEMOGLOBIN A1C
Hgb A1c MFr Bld: 8.5 % — ABNORMAL HIGH (ref 4.8–5.6)
Mean Plasma Glucose: 197.25 mg/dL

## 2017-09-03 LAB — BASIC METABOLIC PANEL
Anion gap: 9 (ref 5–15)
BUN: 29 mg/dL — ABNORMAL HIGH (ref 6–20)
CHLORIDE: 110 mmol/L (ref 101–111)
CO2: 22 mmol/L (ref 22–32)
Calcium: 8.2 mg/dL — ABNORMAL LOW (ref 8.9–10.3)
Creatinine, Ser: 1.94 mg/dL — ABNORMAL HIGH (ref 0.44–1.00)
GFR calc Af Amer: 31 mL/min — ABNORMAL LOW (ref >60)
GFR calc non Af Amer: 27 mL/min — ABNORMAL LOW (ref >60)
GLUCOSE: 204 mg/dL — AB (ref 65–99)
POTASSIUM: 4.9 mmol/L (ref 3.5–5.1)
Sodium: 141 mmol/L (ref 135–145)

## 2017-09-03 LAB — CBG MONITORING, ED: Glucose-Capillary: 292 mg/dL — ABNORMAL HIGH (ref 65–99)

## 2017-09-03 LAB — I-STAT TROPONIN, ED: Troponin i, poc: 0.01 ng/mL (ref 0.00–0.08)

## 2017-09-03 LAB — TROPONIN I: Troponin I: <0.03 ng/mL (ref <0.03)

## 2017-09-03 LAB — BRAIN NATRIURETIC PEPTIDE: B Natriuretic Peptide: 919 pg/mL — ABNORMAL HIGH (ref 0.0–100.0)

## 2017-09-03 MED ORDER — ONDANSETRON HCL 4 MG/2ML IJ SOLN: 4.0000 mg | Freq: Four times a day (QID) | INTRAMUSCULAR | Status: DC | PRN

## 2017-09-03 MED ORDER — AMLODIPINE BESYLATE 10 MG PO TABS
10.0000 mg | ORAL_TABLET | Freq: Every day | ORAL | Status: DC
  Administered 2017-09-03 – 2017-09-05 (×3): 10 mg via ORAL
  Filled 2017-09-03: qty 2
  Filled 2017-09-03 (×2): qty 1

## 2017-09-03 MED ORDER — FUROSEMIDE 10 MG/ML IJ SOLN
40.0000 mg | Freq: Once | INTRAMUSCULAR | Status: AC
  Administered 2017-09-03: 40 mg via INTRAVENOUS
  Filled 2017-09-03: qty 4

## 2017-09-03 MED ORDER — GABAPENTIN 300 MG PO CAPS
300.0000 mg | ORAL_CAPSULE | Freq: Every day | ORAL | Status: DC
  Administered 2017-09-03 – 2017-09-05 (×3): 300 mg via ORAL
  Filled 2017-09-03 (×3): qty 1

## 2017-09-03 MED ORDER — METOPROLOL SUCCINATE ER 100 MG PO TB24: 100.0000 mg | ORAL_TABLET | Freq: Every day | ORAL | Status: DC

## 2017-09-03 MED ORDER — MAGNESIUM OXIDE 400 (241.3 MG) MG PO TABS
400.0000 mg | ORAL_TABLET | Freq: Two times a day (BID) | ORAL | Status: DC
  Administered 2017-09-03 – 2017-09-05 (×4): 400 mg via ORAL
  Filled 2017-09-03 (×5): qty 1

## 2017-09-03 MED ORDER — SULFAMETHOXAZOLE-TRIMETHOPRIM 400-80 MG PO TABS
1.0000 | ORAL_TABLET | ORAL | Status: DC
  Administered 2017-09-03 – 2017-09-05 (×2): 1 via ORAL
  Filled 2017-09-03 (×2): qty 1

## 2017-09-03 MED ORDER — METOPROLOL SUCCINATE ER 100 MG PO TB24
100.0000 mg | ORAL_TABLET | Freq: Every day | ORAL | Status: DC
  Administered 2017-09-05: 100 mg via ORAL
  Filled 2017-09-03 (×3): qty 1

## 2017-09-03 MED ORDER — ACETAMINOPHEN 650 MG RE SUPP: 650.0000 mg | Freq: Four times a day (QID) | RECTAL | Status: DC | PRN

## 2017-09-03 MED ORDER — MYCOPHENOLATE SODIUM 180 MG PO TBEC
360.0000 mg | DELAYED_RELEASE_TABLET | Freq: Two times a day (BID) | ORAL | Status: DC
  Administered 2017-09-03 – 2017-09-05 (×4): 360 mg via ORAL
  Filled 2017-09-03 (×6): qty 2

## 2017-09-03 MED ORDER — MORPHINE SULFATE (PF) 4 MG/ML IV SOLN
4.0000 mg | Freq: Once | INTRAVENOUS | Status: AC
  Administered 2017-09-03: 4 mg via INTRAVENOUS
  Filled 2017-09-03: qty 1

## 2017-09-03 MED ORDER — HEPARIN SODIUM (PORCINE) 5000 UNIT/ML IJ SOLN
5000.0000 [IU] | Freq: Three times a day (TID) | INTRAMUSCULAR | Status: DC
  Administered 2017-09-03 – 2017-09-05 (×6): 5000 [IU] via SUBCUTANEOUS
  Filled 2017-09-03 (×7): qty 1

## 2017-09-03 MED ORDER — ONDANSETRON HCL 4 MG PO TABS: 4.0000 mg | ORAL_TABLET | Freq: Four times a day (QID) | ORAL | Status: DC | PRN

## 2017-09-03 MED ORDER — DORZOLAMIDE HCL-TIMOLOL MAL 2-0.5 % OP SOLN
1.0000 [drp] | Freq: Two times a day (BID) | OPHTHALMIC | Status: DC
  Administered 2017-09-03 – 2017-09-05 (×4): 1 [drp] via OPHTHALMIC
  Filled 2017-09-03: qty 10

## 2017-09-03 MED ORDER — INSULIN ASPART 100 UNIT/ML ~~LOC~~ SOLN
0.0000 [IU] | Freq: Three times a day (TID) | SUBCUTANEOUS | Status: DC
  Administered 2017-09-04 (×2): 3 [IU] via SUBCUTANEOUS
  Administered 2017-09-04: 5 [IU] via SUBCUTANEOUS
  Administered 2017-09-05: 4 [IU] via SUBCUTANEOUS
  Administered 2017-09-05: 8 [IU] via SUBCUTANEOUS
  Administered 2017-09-05: 3 [IU] via SUBCUTANEOUS

## 2017-09-03 MED ORDER — SODIUM BICARBONATE 650 MG PO TABS
650.0000 mg | ORAL_TABLET | Freq: Two times a day (BID) | ORAL | Status: DC
  Administered 2017-09-03 – 2017-09-05 (×4): 650 mg via ORAL
  Filled 2017-09-03 (×5): qty 1

## 2017-09-03 MED ORDER — ISOSORBIDE MONONITRATE ER 30 MG PO TB24: 60.0000 mg | ORAL_TABLET | Freq: Every day | ORAL | Status: DC

## 2017-09-03 MED ORDER — TACROLIMUS 1 MG PO CAPS
3.0000 mg | ORAL_CAPSULE | Freq: Every day | ORAL | Status: DC
  Administered 2017-09-04 – 2017-09-05 (×2): 3 mg via ORAL
  Filled 2017-09-03 (×3): qty 3

## 2017-09-03 MED ORDER — FUROSEMIDE 10 MG/ML IJ SOLN
40.0000 mg | Freq: Two times a day (BID) | INTRAMUSCULAR | Status: DC
  Administered 2017-09-03 – 2017-09-05 (×5): 40 mg via INTRAVENOUS
  Filled 2017-09-03 (×5): qty 4

## 2017-09-03 MED ORDER — ASPIRIN EC 81 MG PO TBEC
81.0000 mg | DELAYED_RELEASE_TABLET | Freq: Every day | ORAL | Status: DC
  Administered 2017-09-03 – 2017-09-05 (×3): 81 mg via ORAL
  Filled 2017-09-03 (×3): qty 1

## 2017-09-03 MED ORDER — ACETAMINOPHEN 325 MG PO TABS
650.0000 mg | ORAL_TABLET | Freq: Four times a day (QID) | ORAL | Status: DC | PRN
  Administered 2017-09-03: 650 mg via ORAL
  Filled 2017-09-03: qty 2

## 2017-09-03 MED ORDER — SENNOSIDES-DOCUSATE SODIUM 8.6-50 MG PO TABS: 1.0000 | ORAL_TABLET | Freq: Every evening | ORAL | Status: DC | PRN

## 2017-09-03 MED ORDER — ISOSORBIDE MONONITRATE ER 60 MG PO TB24
60.0000 mg | ORAL_TABLET | Freq: Every day | ORAL | Status: DC
  Administered 2017-09-04 – 2017-09-05 (×2): 60 mg via ORAL
  Filled 2017-09-03 (×2): qty 1

## 2017-09-03 MED ORDER — PANTOPRAZOLE SODIUM 40 MG PO TBEC
40.0000 mg | DELAYED_RELEASE_TABLET | Freq: Every day | ORAL | Status: DC
  Administered 2017-09-03 – 2017-09-05 (×3): 40 mg via ORAL
  Filled 2017-09-03 (×3): qty 1

## 2017-09-03 MED ORDER — CINACALCET HCL 30 MG PO TABS
30.0000 mg | ORAL_TABLET | ORAL | Status: DC
  Administered 2017-09-05: 30 mg via ORAL
  Filled 2017-09-03: qty 1

## 2017-09-03 MED ORDER — PREDNISOLONE ACETATE 1 % OP SUSP
1.0000 [drp] | Freq: Four times a day (QID) | OPHTHALMIC | Status: DC | PRN
  Administered 2017-09-03 – 2017-09-04 (×2): 1 [drp] via OPHTHALMIC
  Filled 2017-09-03: qty 1

## 2017-09-03 MED ORDER — AMLODIPINE BESYLATE 5 MG PO TABS: 10.0000 mg | ORAL_TABLET | Freq: Every day | ORAL | Status: DC

## 2017-09-03 MED ORDER — INSULIN DETEMIR 100 UNIT/ML ~~LOC~~ SOLN
20.0000 [IU] | Freq: Every day | SUBCUTANEOUS | Status: DC
  Administered 2017-09-03 – 2017-09-04 (×2): 20 [IU] via SUBCUTANEOUS
  Filled 2017-09-03 (×3): qty 0.2

## 2017-09-03 MED ORDER — INSULIN ASPART 100 UNIT/ML ~~LOC~~ SOLN
0.0000 [IU] | Freq: Every day | SUBCUTANEOUS | Status: DC
  Administered 2017-09-03 – 2017-09-04 (×2): 3 [IU] via SUBCUTANEOUS
  Filled 2017-09-03: qty 1

## 2017-09-03 MED ORDER — ILOPERIDONE 4 MG PO TABS
2.0000 mg | ORAL_TABLET | Freq: Every day | ORAL | Status: DC
  Administered 2017-09-04: 2 mg via ORAL
  Filled 2017-09-03 (×3): qty 1

## 2017-09-03 MED ORDER — NITROGLYCERIN 0.4 MG SL SUBL: 0.4000 mg | SUBLINGUAL_TABLET | SUBLINGUAL | Status: DC | PRN

## 2017-09-03 MED ORDER — TACROLIMUS 1 MG PO CAPS
2.0000 mg | ORAL_CAPSULE | Freq: Every day | ORAL | Status: DC
  Administered 2017-09-03 – 2017-09-04 (×2): 2 mg via ORAL
  Filled 2017-09-03 (×3): qty 2

## 2017-09-03 MED ORDER — ATORVASTATIN CALCIUM 20 MG PO TABS
20.0000 mg | ORAL_TABLET | Freq: Every day | ORAL | Status: DC
  Administered 2017-09-03 – 2017-09-05 (×3): 20 mg via ORAL
  Filled 2017-09-03 (×5): qty 1

## 2017-09-03 MED ORDER — ONDANSETRON HCL 4 MG/2ML IJ SOLN
4.0000 mg | Freq: Once | INTRAMUSCULAR | Status: AC
  Administered 2017-09-03: 4 mg via INTRAVENOUS
  Filled 2017-09-03: qty 2

## 2017-09-03 MED ORDER — PREDNISONE 5 MG PO TABS
5.0000 mg | ORAL_TABLET | Freq: Every day | ORAL | Status: DC
  Administered 2017-09-03 – 2017-09-05 (×3): 5 mg via ORAL
  Filled 2017-09-03 (×4): qty 1

## 2017-09-03 NOTE — ED Triage Notes (Signed)
Patient arrived to ED via [facility] from home. Lives alone. EMS reports:  Patient c/o central chest pain x 3 days. Increased this morning. Pressure. Shortness of breath. Patient took ASA 324 PTA of EMS.  EMS administered NTG x 1. No change in chest pain. 3-4/10. Declined any further NTG tabs. Hx - MI, CABG (2018), recent PNA. BP 114/60, Pulse 52, 97% on room air.  Patient took morning meds, but not insulin this morning - per patient.

## 2017-09-03 NOTE — ED Provider Notes (Signed)
[facility] EMERGENCY DEPARTMENT Provider Note   CSN: [id number] Arrival date & time: 09/03/17  0801     History   Chief Complaint Chief Complaint  Patient presents with  . Chest Pain    HPI April Pena is a 62 y.o. female.  Patient with history of congestive heart failure, diabetes, history of CABG x3 in 04/2016 followed by Dr. Percival Spanish, history of kidney transplant on Prograf --presents to the emergency department today with complaint of 3 days of waxing and waning pressure across the bilateral lower chest with occasional sharp and shooting pains.  Occasional pains in her left arm.  She has developed worsening shortness of breath, much worse with exertion.  For the past 3 days she has been sleeping sitting up in a chair because if she tries to lie flat she wakes up gasping for air as soon as she falls asleep.  She has mild lower extremity edema which is unchanged.  She states that she feels "heavy" in the abdomen.  She does not routinely check her weights.  She has had nausea but no persistent vomiting.  No diaphoresis.  Denies any recent fevers or cough.  Patient denies history of blood clots, recent surgery or travel.  EMS administered aspirin and nitroglycerin.  Nitroglycerin did not change her symptoms. The onset of this condition was acute. The course is gradually worsening. Aggravating factors: activity. Alleviating factors: none.       Past Medical History:  Diagnosis Date  . Blind left eye   . Blood transfusion without reported diagnosis   . CHF (congestive heart failure) ([place])   . Diabetes mellitus   . Diabetes mellitus type 2 in obese ([place]) 04/11/2016  . Glaucoma   . Glaucoma   . Groin hematoma, initial encounter 04/11/2016  . Hyperlipidemia 04/11/2016  . Hypertension   . Hypertensive heart disease 04/11/2016  . MI (myocardial infarction) ([place])   . Renal disorder    kidney transplant  . Unstable angina [facility])     Patient Active Problem List   Diagnosis Date Noted  . S/p cadaver renal transplant 2011 04/17/2016  . Chronic renal disease, stage III s/p transplant 04/17/2016  . Acute on chronic renal insufficiency post CABG 04/17/2016  . 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 Essential hypertension 04/11/2016  . Dyslipidemia 04/11/2016  . Groin hematoma, initial encounter 04/11/2016  . Diabetes mellitus type 2 in obese ([place]) 04/11/2016  . NSTEMI (non-ST elevated myocardial infarction) ([place]) 04/07/2016  . Low back pain radiating to right leg 04/04/2011    Past Surgical History:  Procedure Laterality Date  . ABDOMINAL HYSTERECTOMY    . CARDIAC CATHETERIZATION N/A 04/08/2016   Procedure: Left Heart Cath and Coronary Angiography;  Surgeon: Burnell Blanks, MD;  Location: [facility];  Service: Cardiovascular;  Laterality: N/A;  . CATARACT EXTRACTION    . CHOLECYSTECTOMY    . CORONARY ARTERY BYPASS GRAFT N/A 04/12/2016   Procedure: CORONARY ARTERY BYPASS GRAFTING (CABG) times three using left saphenous vein harvested endoscopically;  Surgeon: Gaye Pollack, MD;  Location: [facility] OR;  Service: Open Heart Surgery;  Laterality: N/A;  SEQ SVG-[id number] SVG-PD  . EYE SURGERY     cataract  . KIDNEY TRANSPLANT  2011  . TEE WITHOUT CARDIOVERSION N/A 04/12/2016   Procedure: TRANSESOPHAGEAL ECHOCARDIOGRAM (TEE);  Surgeon: Gaye Pollack, MD;  Location: [facility];  Service: Open Heart Surgery;  Laterality: N/A;  . TONSILLECTOMY    . TRIGGER FINGER RELEASE  OB History   None      Home Medications    Prior to Admission medications   Medication Sig Start Date End Date Taking? Authorizing Provider  amLODipine (NORVASC) 10 MG tablet Take 1 tablet (10 mg total) by mouth daily. 11/28/16  Yes Erlene Quan, PA-C  aspirin EC 81 MG tablet Take 81 mg by mouth daily.   Yes [provider]  atorvastatin (LIPITOR) 20 MG tablet TAKE 1 TABLET BY MOUTH ONCE DAILY 05/08/17  Yes Lelon Perla, MD  cinacalcet (SENSIPAR) 30 MG  tablet Take 30 mg by mouth every Monday, Wednesday, and Friday.   Yes [provider]  dexlansoprazole (DEXILANT) 60 MG capsule Take 60 mg by mouth daily.   Yes [provider]  dorzolamide-timolol (COSOPT) 22.3-6.8 MG/ML ophthalmic solution Place 1 drop into both eyes 2 (two) times daily.   Yes [provider]  gabapentin (NEURONTIN) 300 MG capsule Take 1 capsule (300 mg total) by mouth daily. 04/19/16 12/27/29 Yes Tacy Dura, Donielle M, PA-C  iloperidone (FANAPT) 4 MG TABS tablet Take 4 mg by mouth at bedtime.   Yes [provider]  insulin aspart (NOVOLOG FLEXPEN) 100 UNIT/ML FlexPen Inject 10-30 Units into the skin 3 (three) times daily with meals. Based on CBG or carb count   Yes [provider]  Insulin Detemir (LEVEMIR FLEXTOUCH) 100 UNIT/ML Pen Inject 20 Units into the skin daily. 04/19/16  Yes Lars Pinks M, PA-C  isosorbide mononitrate (IMDUR) 60 MG 24 hr tablet Take 1 tablet (60 mg total) by mouth daily. 11/28/16  Yes Kilroy, Luke K, PA-C  magnesium oxide (MAG-OX) 400 MG tablet Take 400 mg by mouth 2 (two) times daily.   Yes [provider]  metoprolol succinate (TOPROL-XL) 100 MG 24 hr tablet Take 1 tablet (100 mg total) by mouth daily. Take with or immediately following a meal. 11/28/16  Yes Kilroy, Doreene Burke, PA-C  mycophenolate (MYFORTIC) 180 MG EC tablet Take 360 mg by mouth 2 (two) times daily.    Yes [provider]  nitroGLYCERIN (NITROSTAT) 0.4 MG SL tablet Place 0.4 mg under the tongue every 5 (five) minutes as needed for chest pain.   Yes [provider]  oxyCODONE-acetaminophen (PERCOCET) 7.5-325 MG tablet Take one or two tablet every 4-6 hours PRN severe pain. 04/19/16  Yes Lars Pinks M, PA-C  predniSONE (DELTASONE) 5 MG tablet Take 5 mg by mouth daily.     Yes [provider]  sodium bicarbonate 650 MG tablet Take 650 mg by mouth 2 (two) times daily.   Yes [provider]    sulfamethoxazole-trimethoprim (BACTRIM,SEPTRA) 400-80 MG per tablet Take 1 tablet by mouth every Monday, Wednesday, and Friday.    Yes [provider]  tacrolimus (PROGRAF) 1 MG capsule Take 2-3 mg by mouth See admin instructions. Take 3 capsules (3 mg) by mouth every morning and 2.5 capsules (2.5 mg) at night   Yes [provider]  acetaminophen (TYLENOL) 500 MG tablet Take 500-1,000 mg by mouth every 6 (six) hours as needed (pain).     [provider]  prednisoLONE acetate (PRED FORTE) 1 % ophthalmic suspension Place 1 drop into the left eye 4 (four) times daily as needed (itching).    [provider]    Family History Family History  Problem Relation Age of Onset  . Diabetes Mother   . Hypertension Mother   . Heart attack Mother   . Diabetes Father   . Hypertension Sister   .  Diabetes Brother   . Hypertension Brother   . Diabetes Paternal Grandmother   . Diabetes Daughter   . Hypertension Daughter   . Hyperlipidemia Neg Hx   . Sudden death Neg Hx     Social History Social History   Tobacco Use  . Smoking status: Former Research scientist (life sciences)  . Smokeless tobacco: Never Used  . Tobacco comment: Years ago  Substance Use Topics  . Alcohol use: No  . Drug use: No     Allergies   Iodinated diagnostic agents; Iron; Promethazine; and Wellbutrin [bupropion hcl]   Review of Systems Review of Systems  Constitutional: Negative for diaphoresis and fever.  HENT: Negative for rhinorrhea and sore throat.   Eyes: Negative for redness.  Respiratory: Positive for shortness of breath. Negative for cough.   Cardiovascular: Positive for chest pain and leg swelling. Negative for palpitations.  Gastrointestinal: Positive for abdominal distention and nausea. Negative for abdominal pain, diarrhea and vomiting.  Genitourinary: Negative for dysuria.  Musculoskeletal: Negative for back pain, myalgias and neck pain.  Skin: Negative for rash.  Neurological: Negative for  syncope, light-headedness and headaches.  Psychiatric/Behavioral: The patient is not nervous/anxious.      Physical Exam Updated Vital Signs BP (!) 154/66   Pulse (!) 44   Temp (!) 97.4 F (36.3 C) (Oral)   Resp (!) 21   Ht 5\' 4"  (1.626 m)   Wt 104.3 kg (230 lb)   SpO2 97%   BMI 39.48 kg/m   Physical Exam  Constitutional: She appears well-developed and well-nourished.  HENT:  Head: Normocephalic and atraumatic.  Mouth/Throat: Oropharynx is clear and moist.  Eyes: Conjunctivae are normal. Right eye exhibits no discharge. Left eye exhibits no discharge.  Neck: Normal range of motion. Neck supple.  Cardiovascular: Regular rhythm and normal heart sounds. Bradycardia present.  No murmur heard. Pulmonary/Chest: Effort normal and breath sounds normal. No stridor. No respiratory distress. She has no wheezes. She has no rales.  Abdominal: Soft. There is no tenderness. There is no rebound and no guarding.  Musculoskeletal: She exhibits edema. She exhibits no tenderness.  1+ pitting symmetric edema to bilateral knees.   Neurological: She is alert.  Skin: Skin is warm and dry.  Psychiatric: She has a normal mood and affect.  Nursing note and vitals reviewed.    ED Treatments / Results  Labs (all labs ordered are listed, but only abnormal results are displayed) Labs Reviewed  CBC - Abnormal; Notable for the following components:      Result Value   WBC 3.4 (*)    RBC 3.69 (*)    Hemoglobin 9.7 (*)    HCT 32.2 (*)    Platelets 147 (*)    All other components within normal limits  BASIC METABOLIC PANEL - Abnormal; Notable for the following components:   Glucose, Bld 204 (*)    BUN 29 (*)    Creatinine, Ser 1.94 (*)    Calcium 8.2 (*)    GFR calc non Af Amer 27 (*)    GFR calc Af Amer 31 (*)    All other components within normal limits  BRAIN NATRIURETIC PEPTIDE - Abnormal; Notable for the following components:   B Natriuretic Peptide 919.0 (*)    All other components  within normal limits  I-STAT TROPONIN, ED    EKG ED ECG REPORT   Date: 09/03/2017  Rate: 45  Rhythm: sinus bradycardia  QRS Axis: normal  Intervals: normal  ST/T Wave abnormalities: nonspecific T wave changes  Conduction Disutrbances:none  Narrative Interpretation:   Old EKG Reviewed: unchanged but V6 t-wave no longer inverted  I have personally reviewed the EKG tracing and agree with the computerized printout as noted.  Radiology Dg Chest 2 View  Result Date: 09/03/2017 CLINICAL DATA:  Central chest pain and shortness of breath. EXAM: CHEST - 2 VIEW COMPARISON:  Chest x-ray dated July 29, 2017. FINDINGS: Stable cardiomegaly status post CABG. Persistent pulmonary vascular congestion and mild interstitial edema. No focal consolidation, pleural effusion, or pneumothorax. No acute osseous abnormality. IMPRESSION: Pulmonary vascular congestion and mild interstitial edema. Electronically Signed   By: Titus Dubin M.D.   On: 09/03/2017 09:48    Procedures Procedures (including critical care time)  Medications Ordered in ED Medications  furosemide (LASIX) injection 40 mg (has no administration in time range)  morphine 4 MG/ML injection 4 mg (4 mg Intravenous Given 09/03/17 0958)  ondansetron (ZOFRAN) injection 4 mg (4 mg Intravenous Given 09/03/17 0958)     Initial Impression / Assessment and Plan / ED Course  I have reviewed the triage vital signs and the nursing notes.  Pertinent labs & imaging results that were available during my care of the patient were reviewed by me and considered in my medical decision making (see chart for details).     Patient seen and examined. EKG reviewed. Work-up initiated. History suggestive of worsening CHF. Will eval current kidney function, ACS. Do not suspect PE, dissection at this point.   Reviewed previous echo.   Vital signs reviewed and are as follows: BP (!) 146/106   Pulse (!) 46   Temp (!) 97.4 F (36.3 C) (Oral)   Resp 18    Ht 5\' 4"  (1.626 m)   Wt 104.3 kg (230 lb)   SpO2 99%   BMI 39.48 kg/m   11:36 AM patient with edema on chest x-ray.  Blood pressure is relatively controlled 146/106.  Patient's kidney function appears to be at her baseline.  I have ordered 40 mg of IV Lasix.  Will need admission for monitoring and diuresis.   Discussed patient with cardiology, they will see. They will determine if they will take patient primarily.   3:54 PM Cards requested internal med admit. Spoke with IMTS who will see this unassigned patient.   Final Clinical Impressions(s) / ED Diagnoses   Final diagnoses:  Congestive heart failure, unspecified HF chronicity, unspecified heart failure type ([place])  Chest pressure  Sinus bradycardia  H/O kidney transplant   Admit for work-up and treatment.  ED Discharge Orders    None       Carlisle Cater, Hershal Coria 09/03/17 1555    Pattricia Boss, MD 09/04/17 1430

## 2017-09-03 NOTE — ED Notes (Signed)
Pharmacy notifies me that Fanapt is not available and they inquire if she can bring her medication from home.

## 2017-09-03 NOTE — ED Notes (Signed)
Heart Healthy/Carb Modified diet called to Nutrition Services.

## 2017-09-03 NOTE — Consult Note (Addendum)
Cardiology Consultation:   Patient ID: April Pena; [id number]; June 01, 1956   Admit date: 09/03/2017 Date of Consult: 09/03/2017  Primary Care Provider: Guadlupe Spanish, Pena Primary Cardiologist: April Pena Primary Electrophysiologist:  None   Patient Profile:   April Pena is a 62 y.o. female with a PMH of CAD s/p NSTEMI and CABG 9024, chronic diastolic CHF, HTN, HLD, DM type 2 on insulin, ESRD s/p renal transplant with ongoing CKD stage 3, and glaucoma who is being seen today for the evaluation of acute on chronic CHF at the request of April Pena.  History of Present Illness:   April Pena was in her usual state of health until a few days ago when she noticed waxing and waning pressure across her bilateral lower chest which occurs more often with increased activity but has occurred at rest. She describes a non-radiating band-like pressure across her lower chest, without associated symptoms of diaphoresis, SOB, dizziness, or lightheadedness.  She has had progressively worsening DOE and abdominal fullness as well. For the past 3 days she has been sleeping in a chair due to orthopnea. She reports chronic LE edema which is unchanged.   She was last evaluated by cardiology outpatient 11/2016 and thought to be doing well from a cardiac standpoint. She was without anginal complaints and no medications were changed at that time. She is s/p CABG 04/2016 with SVG-OM1-OM2 and SVG-PDA. Last Echo prior to CABG 03/2016 with EF 65-70% and G1DD. She denies prior issues with heart failure. She reports poor eating habits and frequently misses meals due to daytime sleeping. She does not check her weight, however reports clothes have been tighter.   On ROS, she notes recent PNA (s/p 5d course of Levaquin the end of February) with persistent non-productive cough, some nausea without vomiting, and difficulty urinating today. She denies recent fever, abdominal pain, constipation, or diarrhea.   In the ED,  she has been bradycardic and mildly hypertensive, satting well on RA. Labs notable for electrolytes wnl, Cr 1.9 (baseline), hyperglycemia, Hgb 9.7, PLT 147, Troponin 0.01 x1, BNP 919. CXR with pulmonary vascular congestion and mild interstitial edema. EKG with sinus brady, some wandering baseline, isolated TWI aVL (seen on previous), no STE/D. Patient was ordered for IV lasix 40mg  with cardiology consulting for further management.   Past Medical History:  Diagnosis Date  . Blind left eye   . Blood transfusion without reported diagnosis   . CHF (congestive heart failure) ([place])   . Diabetes mellitus   . Diabetes mellitus type 2 in obese ([place]) 04/11/2016  . Glaucoma   . Glaucoma   . Groin hematoma, initial encounter 04/11/2016  . Hyperlipidemia 04/11/2016  . Hypertension   . Hypertensive heart disease 04/11/2016  . MI (myocardial infarction) ([place])   . Renal disorder    kidney transplant  . Unstable angina [facility])     Past Surgical History:  Procedure Laterality Date  . ABDOMINAL HYSTERECTOMY    . CARDIAC CATHETERIZATION N/A 04/08/2016   Procedure: Left Heart Cath and Coronary Angiography;  Surgeon: April Blanks, Pena;  Location: [facility];  Service: Cardiovascular;  Laterality: N/A;  . CATARACT EXTRACTION    . CHOLECYSTECTOMY    . CORONARY ARTERY BYPASS GRAFT N/A 04/12/2016   Procedure: CORONARY ARTERY BYPASS GRAFTING (CABG) times three using left saphenous vein harvested endoscopically;  Surgeon: April Pollack, Pena;  Location: [facility];  Service: Open Heart Surgery;  Laterality: N/A;  SEQ SVG-OM1-OM2 SVG-PD  . EYE SURGERY  cataract  . KIDNEY TRANSPLANT  2011  . TEE WITHOUT CARDIOVERSION N/A 04/12/2016   Procedure: TRANSESOPHAGEAL ECHOCARDIOGRAM (TEE);  Surgeon: April Pollack, Pena;  Location: [facility];  Service: Open Heart Surgery;  Laterality: N/A;  . TONSILLECTOMY    . TRIGGER FINGER RELEASE       Home Medications:  Prior to Admission medications   Medication Sig Start  Date End Date Taking? Authorizing Provider  amLODipine (NORVASC) 10 MG tablet Take 1 tablet (10 mg total) by mouth daily. 11/28/16  Yes April Pena  aspirin EC 81 MG tablet Take 81 mg by mouth daily.   Yes Provider, Historical, Pena  atorvastatin (LIPITOR) 20 MG tablet TAKE 1 TABLET BY MOUTH ONCE DAILY 05/08/17  Yes April Pena  cinacalcet (SENSIPAR) 30 MG tablet Take 30 mg by mouth every Monday, Wednesday, and Friday.   Yes Provider, Historical, Pena  dexlansoprazole (DEXILANT) 60 MG capsule Take 60 mg by mouth daily.   Yes Provider, Historical, Pena  dorzolamide-timolol (COSOPT) 22.3-6.8 MG/ML ophthalmic solution Place 1 drop into both eyes 2 (two) times daily.   Yes Provider, Historical, Pena  gabapentin (NEURONTIN) 300 MG capsule Take 1 capsule (300 mg total) by mouth daily. 04/19/16 12/27/29 Yes April Pena  iloperidone (FANAPT) 4 MG TABS tablet Take 4 mg by mouth at bedtime.   Yes Provider, Historical, Pena  insulin aspart (NOVOLOG FLEXPEN) 100 UNIT/ML FlexPen Inject 10-30 Units into the skin 3 (three) times daily with meals. Based on CBG or carb count   Yes Provider, Historical, Pena  Insulin Detemir (LEVEMIR FLEXTOUCH) 100 UNIT/ML Pen Inject 20 Units into the skin daily. 04/19/16  Yes April Pena  isosorbide mononitrate (IMDUR) 60 MG 24 hr tablet Take 1 tablet (60 mg total) by mouth daily. 11/28/16  Yes April Pena  magnesium oxide (MAG-OX) 400 MG tablet Take 400 mg by mouth 2 (two) times daily.   Yes Provider, Historical, Pena  metoprolol succinate (TOPROL-XL) 100 MG 24 hr tablet Take 1 tablet (100 mg total) by mouth daily. Take with or immediately following a meal. 11/28/16  Yes April Pena  mycophenolate (MYFORTIC) 180 MG EC tablet Take 360 mg by mouth 2 (two) times daily.    Yes Provider, Historical, Pena  nitroGLYCERIN (NITROSTAT) 0.4 MG SL tablet Place 0.4 mg under the tongue every 5 (five) minutes as needed for chest pain.   Yes Provider,  Historical, Pena  oxyCODONE-acetaminophen (PERCOCET) 7.5-325 MG tablet Take one or two tablet every 4-6 hours PRN severe pain. 04/19/16  Yes April Pena  predniSONE (DELTASONE) 5 MG tablet Take 5 mg by mouth daily.     Yes Provider, Historical, Pena  sodium bicarbonate 650 MG tablet Take 650 mg by mouth 2 (two) times daily.   Yes Provider, Historical, Pena  sulfamethoxazole-trimethoprim (BACTRIM,SEPTRA) 400-80 MG per tablet Take 1 tablet by mouth every Monday, Wednesday, and Friday.    Yes Provider, Historical, Pena  tacrolimus (PROGRAF) 1 MG capsule Take 2-3 mg by mouth See admin instructions. Take 3 capsules (3 mg) by mouth every morning and 2.5 capsules (2.5 mg) at night   Yes Provider, Historical, Pena  acetaminophen (TYLENOL) 500 MG tablet Take 500-1,000 mg by mouth every 6 (six) hours as needed (pain).     Provider, Historical, Pena  prednisoLONE acetate (PRED FORTE) 1 % ophthalmic suspension Place 1 drop into the left eye 4 (four) times daily as needed (itching).    Provider,  Historical, Pena    Inpatient Medications: Scheduled Meds: . furosemide  40 mg Intravenous Once   Continuous Infusions:  PRN Meds:   Allergies:    Allergies  Allergen Reactions  . Iodinated Diagnostic Agents Other (See Comments)    Kidneys  . Iron Hives and Other (See Comments)    Elevated blood sugar, glaucoma worsened,   . Promethazine Other (See Comments)    Pt states her muscles jerk  . Wellbutrin [Bupropion Hcl] Nausea And Vomiting    Social History:   Social History   Socioeconomic History  . Marital status: Divorced    Spouse name: Not on file  . Number of children: 2  . Years of education: Not on file  . Highest education level: Not on file  Occupational History  . Occupation: Disabled  Social Needs  . Financial resource strain: Not on file  . Food insecurity:    Worry: Not on file    Inability: Not on file  . Transportation needs:    Medical: Not on file    Non-medical: Not on  file  Tobacco Use  . Smoking status: Former Research scientist (life sciences)  . Smokeless tobacco: Never Used  . Tobacco comment: Years ago  Substance and Sexual Activity  . Alcohol use: No  . Drug use: No  . Sexual activity: Not on file  Lifestyle  . Physical activity:    Days per week: Not on file    Minutes per session: Not on file  . Stress: Not on file  Relationships  . Social connections:    Talks on phone: Not on file    Gets together: Not on file    Attends religious service: Not on file    Active member of club or organization: Not on file    Attends meetings of clubs or organizations: Not on file    Relationship status: Not on file  . Intimate partner violence:    Fear of current or ex partner: Not on file    Emotionally abused: Not on file    Physically abused: Not on file    Forced sexual activity: Not on file  Other Topics Concern  . Not on file  Social History Narrative   Lives alone    Family History:    Family History  Problem Relation Age of Onset  . Diabetes Mother   . Hypertension Mother   . Heart attack Mother   . Diabetes Father   . Hypertension Sister   . Diabetes Brother   . Hypertension Brother   . Diabetes Paternal Grandmother   . Diabetes Daughter   . Hypertension Daughter   . Hyperlipidemia Neg Hx   . Sudden death Neg Hx      ROS:  Please see the history of present illness.   All other ROS reviewed and negative.     Physical Exam/Data:   Vitals:   09/03/17 1015 09/03/17 1045 09/03/17 1200 09/03/17 1230  BP: (!) 157/76 121/78 (!) 157/61 (!) 158/60  Pulse: (!) 42 (!) 40 (!) 44 (!) 43  Resp: (!) 24 (!) 21 (!) 21 12  Temp:      TempSrc:      SpO2: 97% 95% 97% 95%  Weight:      Height:       No intake or output data in the 24 hours ending 09/03/17 1258 Filed Weights   09/03/17 0835  Weight: 230 lb (104.3 kg)   Body mass index is 39.48 kg/m.  General:  Obese  elderly female laying in bed in no acute distress HEENT: sclera anicteric  Neck: no JVD  appreciated Endocrine:  No thryomegaly Vascular: No carotid bruits; distal pulses 2+ bilaterally Cardiac:  normal S1, S2; bradycardic, regular rhythm; no murmurs, gallops, or rubs Lungs:  Decreased breath sounds at bases with mild crackles Abd: NABS, soft, nontender, no hepatomegaly Ext: 1+ LE edema to knees Musculoskeletal:  No deformities, BUE and BLE strength normal and equal Skin: warm and dry  Neuro:  CNs 2-12 intact, no focal abnormalities noted Psych:  Normal affect   EKG:  The EKG was personally reviewed and demonstrates:  Sinus brady with some baseline wander, isolate TWI avL (seen on previous) Telemetry:  Telemetry was personally reviewed and demonstrates:  Sinus brady  Relevant CV Studies:  LHC 03/2016: Conclusion     2nd Mrg lesion, 99 %stenosed.  Dist Cx-2 lesion, 100 %stenosed.  Dist Cx-1 lesion, 30 %stenosed.  3rd Mrg lesion, 99 %stenosed.  Prox RCA to Mid RCA lesion, 80 %stenosed.  Mid RCA to Dist RCA lesion, 99 %stenosed.  Ost 1st Diag to 1st Diag lesion, 99 %stenosed.  Mid LAD lesion, 40 %stenosed.  Dist LAD-1 lesion, 40 %stenosed.  Dist LAD-2 lesion, 40 %stenosed.   1. Severe double vessel CAD 2. The RCA is diffusely diseased in the proximal, mid and distal segments. This vessel is moderate in caliber but heavily calcified. This vessel is not a favorable target for PCI. (Of note, PCI of the RCA attempted at [facility] in 2016 and unsuccessful).  3. The Circumflex is a large caliber vessel with high grade stenosis in a moderate caliber obtuse marginal branch and total occlusion of the mid AV groove segment. This entire vessel is heavily calcified and not favorable for PCI.  4. Moderate non-obstructive heavily calcified disease in the proximal, mid and distal LAD.  5. Chronic kidney disease, s/p renal transplant.   Recommendations: She has severe CAD and this is not favorable for PCI based on length of lesions, heavy calcification and  chronic total occlusion of the Circumflex.  I will ask CT surgery to see her to discuss bypass of the RCA and Circumflex. The LAD has no flow limiting lesions. If she is not felt to be a surgical candidate, would pursue medical management only. Will hold Plavix while surgical evaluation is pending. Will resume heparin drip 8 hours post sheath pull.    Echocardiogram 03/2016: Study Conclusions  - Left ventricle: The cavity size was normal. Wall thickness was   increased in a pattern of mild LVH. Moderate septal hypertrophy.   Systolic function was vigorous. The estimated ejection fraction   was in the range of 65% to 70%. Wall motion was normal; there   were no regional wall motion abnormalities. Doppler parameters   are consistent with abnormal left ventricular relaxation (grade 1   diastolic dysfunction). Doppler parameters are consistent with   high ventricular filling pressure. - Mitral valve: Calcified annulus.  Laboratory Data:  Chemistry Recent Labs  Lab 09/03/17 1012  NA 141  April 4.9  CL 110  CO2 22  GLUCOSE 204*  BUN 29*  CREATININE 1.94*  CALCIUM 8.2*  GFRNONAA 27*  GFRAA 31*  ANIONGAP 9    No results for input(s): PROT, ALBUMIN, AST, ALT, ALKPHOS, BILITOT in the last 168 hours. Hematology Recent Labs  Lab 09/03/17 1012  WBC 3.4*  RBC 3.69*  HGB 9.7*  HCT 32.2*  MCV 87.3  MCH 26.3  MCHC 30.1  RDW  15.0  PLT 147*   Cardiac EnzymesNo results for input(s): TROPONINI in the last 168 hours.  Recent Labs  Lab 09/03/17 1016  TROPIPOC 0.01    BNP Recent Labs  Lab 09/03/17 1008  BNP 919.0*    DDimer No results for input(s): DDIMER in the last 168 hours.  Radiology/Studies:  Dg Chest 2 View  Result Date: 09/03/2017 CLINICAL DATA:  Central chest pain and shortness of breath. EXAM: CHEST - 2 VIEW COMPARISON:  Chest x-ray dated July 29, 2017. FINDINGS: Stable cardiomegaly status post CABG. Persistent pulmonary vascular congestion and mild interstitial  edema. No focal consolidation, pleural effusion, or pneumothorax. No acute osseous abnormality. IMPRESSION: Pulmonary vascular congestion and mild interstitial edema. Electronically Signed   By: Titus Dubin M.D.   On: 09/03/2017 09:48    Assessment and Plan:   1. Acute on chronic diastolic CHF: p/w DOE, SOB, orthopnea, and abdominal fullness. Also with b/l lower chest pressure. CXR with pulmonary vascular congestion and mild interstitial edema. BNP 919. Weight reportedly 235lbs on last outpt visit 08/12/17. S/p IV lasix 40mg  x1. - Continue metoprolol and amlodipine - Will check echocardiogram - Will continue IV lasix 40mg  BID  2. CP in pt with CAD s/p NSTEMI and CABG 2017: chest pressure likely 2/2 volume overload in the setting of acute on chronic diastolic CHF. Her symptoms were unrelieved with SL nitro administer en route to ED. EKG with sinus brady with some baseline wander, isolate TWI avL (seen on previous). Troponin negative x1.  - Continue to trend troponin x3 or to peak - Continue ASA and statin - Continue imdur - Would like to obtain a NST to further characterize chest pain, however patient refusing given adverse reaction with prior NST.  - Will check echocardiogram  3. ESRD s/p renal transplant with ongoing CKD stage III: Cr 1.94 (baseline ~2.0).  - Continue to monitor Cr closely with diuresis - Avoid nephrotoxic agents - Continue tacrolimus, mycophenolate, prednisone, and bactrim TIW  4. HTN: BP stable - Continue current regimen  5. DM type 2: on insulin outpatient. No recent A1C; goal <7 - Continue ISS per primary team  6. Dyslipidema: goal LDL <70 - Continue statin  7. Glaucoma: follows with ophthalmology at [facility] - Continue current gtt regimen  8. Urinary hesitancy: reports difficulty urinating - Will check bladder scan  - Straight cath if retaining       For questions or updates, please contact [place] Please consult [email or web address] for contact info  under Cardiology/STEMI.   Signed, Abigail Butts, Pena  09/03/2017 12:58 PM [phone]

## 2017-09-03 NOTE — H&P (Addendum)
Date: 09/03/2017               Patient Name:  April Pena MRN: [id number]  DOB: 1956-03-04 Age / Sex: 62 y.o., female   PCP: Guadlupe Spanish, MD         Medical Service: Internal Medicine Teaching Service         Attending Physician: Dr. Pattricia Boss, MD    First Contact: Dr. Lars Mage Pager: [phone]  Second Contact: Dr. Einar Gip Pager: [phone]       After Hours (After 5p/  First Contact Pager: [phone]  weekends / holidays): Second Contact Pager: [phone]   Chief Complaint: Chest pain  History of Present Illness: April Pena is a 62 year old female with coronary artery disease status post CABG x3 in November 2017, essential hypertension, diabetes mellitus type 2, CKD 3 status post transplant on prograf, acute diastolic heart failure who presented with chest pain for the past 3-4 days. Patient states that chest pain is present over anterior and posterior chest. She describes the pain as a "bear hug with pins being stuck in her under her left breast". The pain lasts <10 min and is alleviated when she massages her chest wall and when she stops moving around. The pain is accompanied by left arm pain that is shooting in nature.  Her last cardiac cath done in 2017 showed severe double vessel coronary artery disease, diffuse RCA CAD in proximal mid and distal segments, high-grade stenosis of circumflex  artery, moderate nonobstructive mid and distal LAD. Patient's last echo in 2017 showed EF 60-65%, trace tricuspid regurg, concentric hypertrophy, mild mitral annular calcification.   The patient states that she restricts her fluid intake to an 8oz bottle of water and 8oz bolle of soda per day. She states that she eats hot dogs, hamburgers, dumplings, and canned tuna. She eats about 700g of sodium per day. She has noticed that she gets short of breath when she lays down. She has also noticed her extremities swelling, abdominal distention, and a weight gain of 7 lbs (230 to 237lbs).    ED course: 146/106, afebrile, bradycardic (46), good saturations BNP=919, I stat trop=0.01  Meds:  Current Meds  Medication Sig  . amLODipine (NORVASC) 10 MG tablet Take 1 tablet (10 mg total) by mouth daily.  Marland Kitchen aspirin EC 81 MG tablet Take 81 mg by mouth daily.  Marland Kitchen atorvastatin (LIPITOR) 20 MG tablet TAKE 1 TABLET BY MOUTH ONCE DAILY  . cinacalcet (SENSIPAR) 30 MG tablet Take 30 mg by mouth every Monday, Wednesday, and Friday.  Marland Kitchen dexlansoprazole (DEXILANT) 60 MG capsule Take 60 mg by mouth daily.  . dorzolamide-timolol (COSOPT) 22.3-6.8 MG/ML ophthalmic solution Place 1 drop into both eyes 2 (two) times daily.  Marland Kitchen gabapentin (NEURONTIN) 300 MG capsule Take 1 capsule (300 mg total) by mouth daily.  Marland Kitchen iloperidone (FANAPT) 4 MG TABS tablet Take 4 mg by mouth at bedtime.  . insulin aspart (NOVOLOG FLEXPEN) 100 UNIT/ML FlexPen Inject 10-30 Units into the skin 3 (three) times daily with meals. Based on CBG or carb count  . Insulin Detemir (LEVEMIR FLEXTOUCH) 100 UNIT/ML Pen Inject 20 Units into the skin daily.  . isosorbide mononitrate (IMDUR) 60 MG 24 hr tablet Take 1 tablet (60 mg total) by mouth daily.  . magnesium oxide (MAG-OX) 400 MG tablet Take 400 mg by mouth 2 (two) times daily.  . metoprolol succinate (TOPROL-XL) 100 MG 24 hr tablet Take 1 tablet (100 mg total) by mouth daily.  Take with or immediately following a meal.  . mycophenolate (MYFORTIC) 180 MG EC tablet Take 360 mg by mouth 2 (two) times daily.   . nitroGLYCERIN (NITROSTAT) 0.4 MG SL tablet Place 0.4 mg under the tongue every 5 (five) minutes as needed for chest pain.  Marland Kitchen oxyCODONE-acetaminophen (PERCOCET) 7.5-325 MG tablet Take one or two tablet every 4-6 hours PRN severe pain.  . predniSONE (DELTASONE) 5 MG tablet Take 5 mg by mouth daily.    . sodium bicarbonate 650 MG tablet Take 650 mg by mouth 2 (two) times daily.  Marland Kitchen sulfamethoxazole-trimethoprim (BACTRIM,SEPTRA) 400-80 MG per tablet Take 1 tablet by mouth every Monday,  Wednesday, and Friday.   . tacrolimus (PROGRAF) 1 MG capsule Take 2-3 mg by mouth See admin instructions. Take 3 capsules (3 mg) by mouth every morning and 2.5 capsules (2.5 mg) at night     Allergies: Allergies as of 09/03/2017 - Review Complete 09/03/2017  Allergen Reaction Noted  . Iodinated diagnostic agents Other (See Comments) 02/03/2014  . Iron Hives and Other (See Comments) 03/30/2011  . Promethazine Other (See Comments) 10/13/2012  . Wellbutrin [bupropion hcl] Nausea And Vomiting 03/30/2011   Past Medical History:  Diagnosis Date  . Blind left eye   . Blood transfusion without reported diagnosis   . CHF (congestive heart failure) ([place])   . Diabetes mellitus   . Diabetes mellitus type 2 in obese ([place]) 04/11/2016  . Glaucoma   . Glaucoma   . Groin hematoma, initial encounter 04/11/2016  . Hyperlipidemia 04/11/2016  . Hypertension   . Hypertensive heart disease 04/11/2016  . MI (myocardial infarction) ([place])   . Renal disorder    kidney transplant  . Unstable angina ([facility])     Family History:  Family History  Problem Relation Age of Onset  . Diabetes Mother   . Hypertension Mother   . Heart attack Mother   . Diabetes Father   . Hypertension Sister   . Diabetes Brother   . Hypertension Brother   . Diabetes Paternal Grandmother   . Diabetes Daughter   . Hypertension Daughter   . Hyperlipidemia Neg Hx   . Sudden death Neg Hx     Social History:  Social History   Socioeconomic History  . Marital status: Divorced    Spouse name: Not on file  . Number of children: 2  . Years of education: Not on file  . Highest education level: Not on file  Occupational History  . Occupation: Disabled  Social Needs  . Financial resource strain: Not on file  . Food insecurity:    Worry: Not on file    Inability: Not on file  . Transportation needs:    Medical: Not on file    Non-medical: Not on file  Tobacco Use  . Smoking status: Former Research scientist (life sciences)  . Smokeless tobacco:  Never Used  . Tobacco comment: Years ago  Substance and Sexual Activity  . Alcohol use: No  . Drug use: No  . Sexual activity: Not on file  Lifestyle  . Physical activity:    Days per week: Not on file    Minutes per session: Not on file  . Stress: Not on file  Relationships  . Social connections:    Talks on phone: Not on file    Gets together: Not on file    Attends religious service: Not on file    Active member of club or organization: Not on file    Attends meetings of clubs  or organizations: Not on file    Relationship status: Not on file  Other Topics Concern  . Not on file  Social History Narrative   Lives alone    Review of Systems: A complete ROS was negative except as per HPI.  Headaches, neck and shoulder pain, nausea, vomited twice, bowel movements every few days. Denies abdominal pain.   Physical Exam: Blood pressure (!) 172/74, pulse (!) 46, temperature (!) 97.4 F (36.3 C), temperature source Oral, resp. rate (!) 21, height 5\' 4"  (1.626 m), weight 230 lb (104.3 kg), SpO2 94 %.  Physical Exam  Constitutional: She appears well-developed and well-nourished. No distress.  HENT:  Head: Normocephalic and atraumatic.  Eyes: Conjunctivae are normal.  Neck: JVD (3cm above sternal angle) present.  Cardiovascular: Normal rate, regular rhythm and normal heart sounds.  Respiratory: Effort normal and breath sounds normal. No respiratory distress. She has no wheezes.  GI: Soft. Bowel sounds are normal. She exhibits distension. There is no tenderness.  Musculoskeletal: She exhibits edema (1+ pitting).  Neurological: She is alert.  Skin: She is not diaphoretic. No erythema.  Psychiatric: She has a normal mood and affect. Her behavior is normal. Judgment and thought content normal.   BMP Latest Ref Rng & Units 09/03/2017 05/10/2017 08/12/2016  Glucose 65 - 99 mg/dL 204(H) 44(LL) 124(H)  BUN 6 - 20 mg/dL 29(H) 29(H) 28(H)  Creatinine 0.44 - 1.00 mg/dL 1.94(H) 2.37(H)  1.68(H)  Sodium 135 - 145 mmol/L 141 139 139  Potassium 3.5 - 5.1 mmol/L 4.9 4.4 4.8  Chloride 101 - 111 mmol/L 110 109 107  CO2 22 - 32 mmol/L 22 24 25   Calcium 8.9 - 10.3 mg/dL 8.2(L) 8.6(L) 9.4   CBC Latest Ref Rng & Units 09/03/2017 05/10/2017 08/12/2016  WBC 4.0 - 10.5 K/uL 3.4(L) 5.2 5.1  Hemoglobin 12.0 - 15.0 g/dL 9.7(L) 10.5(L) 11.3(L)  Hematocrit 36.0 - 46.0 % 32.2(L) 33.5(L) 35.4(L)  Platelets 150 - 400 K/uL 147(L) 230 230   Troponin (Point of Care Test) Recent Labs    09/03/17 1016  TROPIPOC 0.01   EKG: personally reviewed my interpretation is st or t wave changes  CXR: personally reviewed my interpretation is pulmonary vascular congestion  Assessment & Plan by Problem:  Ms Kraker is a 62 yr old f with coronary artery disease s/p CABG x3, essential hypertension, DM type 2, CKD 3 s/p transplant, HFpEF who presented with chest pain for the past 3-4 days. Troponin negative, no ecg changes, BNP elevated. CXR shows some pumonary vascular congestion. Appears volume overloaded. Diuresing for presumed HFpEF exacerbation secondary to poor medication and dietary compliance. She will also receive acs workup.   Acute on chronic diastolic heart failure The patient's last echo in 2017 showed EF 60-65%, trace tricuspid regurg, concentric hypertrophy, mild mitral annular calcification. She appeared volume overloaded on exam (jvd, 1+ pitting edema, abdominal distension). Per history it appears that the patient has a salt rich diet.   -IV lasix 40mg  bid -Repeat echo -strict I/O -monitor daily weights  CAD s/p CABGx3 The patient's last cardiac cath done in 2017 showed severe double vessel coronary artery disease, diffuse RCA CAD in proximal mid and distal segments, high-grade stenosis of circumflex  artery, moderate nonobstructive mid and distal LAD.  Her istat troponin was negative and she did not have any ekg changes. She is at high risk for cardiac event given her significant cardiac  history. Her heart score is 4 which places her at a 12-16% risk of a  MACE.  The patient has had low heart rate ranging (50-60s) since 2017 after she had her CABG.  -Trend troponins -continue imdur 60mg  qd -continue metoprolol 100mg  qd -nitroglycerin q34min prn  Essential Hypertension The patient's weight since admission has ranged 121-172/74-78.  The patient is on amlodipine 10 mg daily.   -continue home amlodipine 10mg  qd  Diabetes mellitus type 2 The patient's last A1c is 8.3. The patient is on levemir 20u qd, novolog 10-30u tid qd based on cbg or carb count.   CKD s/p transplant  -continue home mycofenolate 360mg  bid -bactrim 400-80mg  qmwf -continue tacrolimus 3mg  qam and 2.5mg  qhs  Dispo: Admit patient to Inpatient with expected length of stay greater than 2 midnights.  SignedLars Mage, MD 09/03/2017, 3:34 PM  Pager: [phone]

## 2017-09-03 NOTE — ED Notes (Signed)
Pt placed on a purewick external catheter.  Pt endorses understanding about how it works.

## 2017-09-03 NOTE — ED Notes (Signed)
Will medicate pt with admission meds as ordered once verified by Pharmacy.  Note send.

## 2017-09-04 ENCOUNTER — Other Ambulatory Visit: Payer: Self-pay

## 2017-09-04 ENCOUNTER — Encounter (HOSPITAL_COMMUNITY): Payer: Self-pay | Admitting: General Practice

## 2017-09-04 ENCOUNTER — Inpatient Hospital Stay (HOSPITAL_COMMUNITY): Payer: Medicaid Other

## 2017-09-04 DIAGNOSIS — Z951 Presence of aortocoronary bypass graft: Secondary | ICD-10-CM

## 2017-09-04 DIAGNOSIS — E669 Obesity, unspecified: Secondary | ICD-10-CM

## 2017-09-04 LAB — COMPREHENSIVE METABOLIC PANEL WITH GFR
ALT: 56 U/L — ABNORMAL HIGH (ref 14–54)
AST: 31 U/L (ref 15–41)
Albumin: 3.7 g/dL (ref 3.5–5.0)
Alkaline Phosphatase: 100 U/L (ref 38–126)
Anion gap: 11 (ref 5–15)
BUN: 27 mg/dL — ABNORMAL HIGH (ref 6–20)
CO2: 22 mmol/L (ref 22–32)
Calcium: 8.3 mg/dL — ABNORMAL LOW (ref 8.9–10.3)
Chloride: 106 mmol/L (ref 101–111)
Creatinine, Ser: 2.1 mg/dL — ABNORMAL HIGH (ref 0.44–1.00)
GFR calc Af Amer: 28 mL/min — ABNORMAL LOW
GFR calc non Af Amer: 24 mL/min — ABNORMAL LOW
Glucose, Bld: 211 mg/dL — ABNORMAL HIGH (ref 65–99)
Potassium: 5.9 mmol/L — ABNORMAL HIGH (ref 3.5–5.1)
Sodium: 139 mmol/L (ref 135–145)
Total Bilirubin: 0.9 mg/dL (ref 0.3–1.2)
Total Protein: 6.3 g/dL — ABNORMAL LOW (ref 6.5–8.1)

## 2017-09-04 LAB — CBC
HCT: 32.4 % — ABNORMAL LOW (ref 36.0–46.0)
Hemoglobin: 9.9 g/dL — ABNORMAL LOW (ref 12.0–15.0)
MCH: 26.6 pg (ref 26.0–34.0)
MCHC: 30.6 g/dL (ref 30.0–36.0)
MCV: 87.1 fL (ref 78.0–100.0)
PLATELETS: 149 10*3/uL — AB (ref 150–400)
RBC: 3.72 MIL/uL — AB (ref 3.87–5.11)
RDW: 14.7 % (ref 11.5–15.5)
WBC: 3.6 10*3/uL — AB (ref 4.0–10.5)

## 2017-09-04 LAB — GLUCOSE, CAPILLARY
Glucose-Capillary: 169 mg/dL — ABNORMAL HIGH (ref 65–99)
Glucose-Capillary: 228 mg/dL — ABNORMAL HIGH (ref 65–99)
Glucose-Capillary: 153 mg/dL — ABNORMAL HIGH (ref 65–99)
Glucose-Capillary: 278 mg/dL — ABNORMAL HIGH (ref 65–99)

## 2017-09-04 LAB — TROPONIN I
Troponin I: <0.03 ng/mL
Troponin I: <0.03 ng/mL

## 2017-09-04 LAB — ECHOCARDIOGRAM COMPLETE
Height: 64 in
Weight: 3800 oz

## 2017-09-04 LAB — HIV ANTIBODY (ROUTINE TESTING W REFLEX): HIV Screen 4th Generation wRfx: NONREACTIVE

## 2017-09-04 NOTE — ED Notes (Signed)
PA at bedside.

## 2017-09-04 NOTE — Progress Notes (Addendum)
   Subjective: April Pena was seen laying in her bed this morning doing well. She said that she has been able to walk around in her room this morning without any dizziness or chest discomfort. She said that she will get short of breath after she walks for a long time.   Objective:  Vital signs in last 24 hours: Vitals:   09/04/17 0500 09/04/17 0600 09/04/17 0829 09/04/17 1206  BP: (!) 119/58 (!) 138/57 (!) 152/69 137/67  Pulse: (!) 42 (!) 38 (!) 47 (!) 48  Resp: 13 10 17 13   Temp:   (!) 97.4 F (36.3 C) (!) 97.5 F (36.4 C)  TempSrc:   Oral Oral  SpO2: 98% 98% 95% 95%  Weight:      Height:       Physical Exam  Constitutional: She appears well-developed and well-nourished. No distress.  HENT:  Head: Normocephalic and atraumatic.  Eyes: Conjunctivae are normal.  Neck: JVD present.  Cardiovascular: Normal rate, regular rhythm and normal heart sounds.  Respiratory: Effort normal and breath sounds normal. No respiratory distress. She has no wheezes.  GI: Soft. Bowel sounds are normal. She exhibits no distension. There is no tenderness.  Musculoskeletal: She exhibits no edema.  Neurological: She is alert.  Skin: She is not diaphoretic. No erythema.  Psychiatric: She has a normal mood and affect. Her behavior is normal. Judgment and thought content normal.   Assessment/Plan:  April Pena is a 62 yr old f with coronary artery disease s/p CABG x3, essential hypertension, DM type 2, CKD 3 s/p transplant, HFpEF who presented with chest pain for the past 3-4 days. Troponin negative, no ecg changes, BNP elevated. CXR shows some pumonary vascular congestion. Appears volume overloaded. Diuresing for presumed HFpEF exacerbation secondary to poor medication and dietary compliance. The patient denies stress testing and therefore cardiology recommended echo with outpatient follow up.   Acute on chronic diastolic heart failure Patient volume overloaded today. She put out 2.7L of urine over the past 24  hrs and is -3.4L since admission.Per last echo (in 2017) she has EF 60-65%, trace tricuspid regurg, concentric hypertrophy, mild mitral annular calcification. Will get repeat echo today (3/28).  -IV lasix 40mg  bid -Pending echo -strict I/O -monitor daily weights  CAD s/p CABGx3 Patient is high risk for another coronary event, however she refuses to have stress testing including pharmacologic. She is not a good candidate for cardiac ct due to obesity and prior cabg which will make visualization difficult.   She remains bradycardic (40-50s) however it is reactive and has been chronically present since since 2017 after she had her CABG. Troponins have remained negative.  -continue imdur 60mg  qd -continue metoprolol 100mg  qd -nitroglycerin q56min prn  Essential Hypertension The patient's weight since admission has ranged 137-152/67-69.  The patient is on amlodipine 10 mg daily.   -continue home amlodipine 10mg  qd  Diabetes mellitus type 2 The patient's last A1c is 8.3. The patient is on levemir 20u qd, novolog 10-30u tid qd based on cbg or carb count at home.   -Levemir 20u qhs -SSI tidwc and qhs  CKD s/p transplant  -continue home mycofenolate 360mg  bid -bactrim 400-80mg  qmwf -continue tacrolimus 3mg  qam and 2.5mg  qhs  Dispo: Anticipated discharge in approximately 0-1 day(s) pending echo.  Lars Mage, MD 09/04/2017, 1:27 PM Pager: [phone]

## 2017-09-04 NOTE — Progress Notes (Signed)
Paged about patient being unresponsive although with normal vital signs. Rapid response was called and she only had mild response to sternal rub, painful stimuli, and gag reflex. The patient was hemodynamically stable, had normal pulsation, was saturating well throughout the entire unresponsive event. The patient mentioned that she was sleeping and just did not want to wake up.   The patient's neuro exam was normal. She did not have any abnormal heart sounds.   Lars Mage, MD Internal Medicine PGY1 Pager:[phone] 09/04/2017, 6:41 PM

## 2017-09-04 NOTE — Progress Notes (Addendum)
Progress Note  Patient Name: April Pena Date of Encounter: 09/04/2017  Primary Cardiologist: Minus Breeding, MD   Subjective   Sleeping and difficult to rouse. Feels breathing is more comfortable. Denies reoccurrence of chest pressure.   Inpatient Medications    Scheduled Meds: . amLODipine  10 mg Oral Daily  . aspirin EC  81 mg Oral Daily  . atorvastatin  20 mg Oral Daily  . [START ON 09/05/2017] cinacalcet  30 mg Oral Once per day on Mon Wed Fri  . dorzolamide-timolol  1 drop Both Eyes BID  . furosemide  40 mg Intravenous BID  . gabapentin  300 mg Oral Daily  . heparin  5,000 Units Subcutaneous Q8H  . iloperidone  2 mg Oral QHS  . insulin aspart  0-15 Units Subcutaneous TID WC  . insulin aspart  0-5 Units Subcutaneous QHS  . insulin detemir  20 Units Subcutaneous QHS  . isosorbide mononitrate  60 mg Oral Daily  . magnesium oxide  400 mg Oral BID  . metoprolol succinate  100 mg Oral Daily  . mycophenolate  360 mg Oral BID  . pantoprazole  40 mg Oral Daily  . predniSONE  5 mg Oral Daily  . sodium bicarbonate  650 mg Oral BID  . sulfamethoxazole-trimethoprim  1 tablet Oral Q M,W,F  . tacrolimus  2 mg Oral QHS  . tacrolimus  3 mg Oral Daily   Continuous Infusions:  PRN Meds: acetaminophen **OR** acetaminophen, nitroGLYCERIN, ondansetron **OR** ondansetron (ZOFRAN) IV, prednisoLONE acetate, senna-docusate   Vital Signs    Vitals:   09/04/17 0300 09/04/17 0400 09/04/17 0500 09/04/17 0600  BP: 121/71 (!) 170/58 (!) 119/58 (!) 138/57  Pulse: (!) 43 (!) 43 (!) 42 (!) 38  Resp: 12 11 13 10   Temp:      TempSrc:      SpO2: 100% 100% 98% 98%  Weight:      Height:        Intake/Output Summary (Last 24 hours) at 09/04/2017 0818 Last data filed at 09/04/2017 0649 Gross per 24 hour  Intake -  Output 2750 ml  Net -2750 ml   Filed Weights   09/03/17 0835 09/04/17 0115  Weight: 230 lb (104.3 kg) 237 lb 8 oz (107.7 kg)    Telemetry    Sinus brady with HR low 39 -  Personally Reviewed   Physical Exam   GEN: Somnolent, laying in bed relatively flat in no acute distress.   Neck: No JVD, no carotid bruits Cardiac: bradycardic, regular rhythm, no murmurs, rubs, or gallops.  Respiratory: decreased breath sounds at bases, mildly improved from yesterday GI: NABS, Soft, nontender, non-distended  MS: trace-1+ LE edema; No deformity. Neuro:  Nonfocal, moving all extremities spontaneously Psych: Normal affect   Labs    Chemistry Recent Labs  Lab 09/03/17 1012 09/04/17 0619  NA 141 139  K 4.9 5.9*  CL 110 106  CO2 22 22  GLUCOSE 204* 211*  BUN 29* 27*  CREATININE 1.94* 2.10*  CALCIUM 8.2* 8.3*  PROT  --  6.3*  ALBUMIN  --  3.7  AST  --  31  ALT  --  56*  ALKPHOS  --  100  BILITOT  --  0.9  GFRNONAA 27* 24*  GFRAA 31* 28*  ANIONGAP 9 11     Hematology Recent Labs  Lab 09/03/17 1012 09/04/17 0619  WBC 3.4* 3.6*  RBC 3.69* 3.72*  HGB 9.7* 9.9*  HCT 32.2* 32.4*  MCV 87.3 87.1  MCH 26.3 26.6  MCHC 30.1 30.6  RDW 15.0 14.7  PLT 147* 149*    Cardiac Enzymes Recent Labs  Lab 09/03/17 2114 09/04/17 0146 09/04/17 0619  TROPONINI <0.03 <0.03 <0.03    Recent Labs  Lab 09/03/17 1016  TROPIPOC 0.01     BNP Recent Labs  Lab 09/03/17 1008  BNP 919.0*     DDimer No results for input(s): DDIMER in the last 168 hours.   Radiology    Dg Chest 2 View  Result Date: 09/03/2017 CLINICAL DATA:  Central chest pain and shortness of breath. EXAM: CHEST - 2 VIEW COMPARISON:  Chest x-ray dated July 29, 2017. FINDINGS: Stable cardiomegaly status post CABG. Persistent pulmonary vascular congestion and mild interstitial edema. No focal consolidation, pleural effusion, or pneumothorax. No acute osseous abnormality. IMPRESSION: Pulmonary vascular congestion and mild interstitial edema. Electronically Signed   By: Titus Dubin M.D.   On: 09/03/2017 09:48    Cardiac Studies   Echo pending  Patient Profile     62 y.o. female  with a PMH of CAD s/p NSTEMI and CABG 1610, chronic diastolic CHF, HTN, HLD, DM type 2 on insulin, ESRD s/p renal transplant with ongoing CKD stage 3, and glaucoma who is being followed by cardiology for f acute on chronic CHF    Assessment & Plan    1. Acute on chronic diastolic CHF: p/w DOE, SOB, orthopnea, abdominal fullness. CXR with pulmonary vascular congestion and mild interstitial edema. BNP 919. Started on IV lasix 40mg  BID. UOP with -2.75L in the past 24 hours. Breathing more comfortably, orthopnea resolved.  - Echo pending - if EF stable, and no other significant changes, anticipate she will be stable for discharge home later today.  - Would plan to discharge home on po lasix daily - recommend BMET check in 1 week to monitor Cr. - Will arrange f/u with Dr. Percival Spanish  - Continue metoprolol (with hold parameters), amlodipine  2. CP in pt with CAD s/p NSTEMI and CABG 2017: chest pressure likely 2/2 volume overload in the setting of acute on chronic diastolic CHF. EKG non-ischemic. Troponin negative x3.  - Continue ASA, statin, and imdur - Would like to obtain a NST to further characterize chest pain, however patient refusing given adverse reaction with prior NST.  - Will check echocardiogram  3. ESRD s/p renal transplant with ongoing CKD stage III: Cr 2.1 today (baseline ~2.0).  - Continue to monitor Cr closely with diuresis - Avoid nephrotoxic agents - Continue tacrolimus, mycophenolate, prednisone, and bactrim TIW  4. HTN: BP stable - Continue current regimen  5. DM type 2: on insulin outpatient. No recent A1C; goal <7 - Continue ISS per primary team  6. Dyslipidema: goal LDL <70 - Continue statin  7. Glaucoma: follows with ophthalmology at [facility] - Continue current gtt regimen      For questions or updates, please contact [place] Please consult [email or web address] for contact info under Cardiology/STEMI.      Signed, Abigail Butts, PA-C  09/04/2017, 8:18  AM   [phone]  Patient examined chart reviewed. No chest pain. Has R/O no acute ECG changes Exam with obese black female Blind left eye clear lungs no murmur transplanted kidney in RLQ. Ok to d/c latter today if echo shows good LV function She again refuses to have non invasive testing with lexiscan myovue and would not be able to negotiate treadmill. Cardiac CT would not be useful given obesity renal failure (avoiding dye) and previous CABG usually  cannot evaluate disease distal to grafts   Jenkins Rouge

## 2017-09-04 NOTE — Progress Notes (Signed)
2D Echocardiogram has been performed.  April Pena 09/04/2017, 3:31 PM

## 2017-09-04 NOTE — Discharge Instructions (Signed)
It was a pleasure to take care of you April Pena. During your hospitalization you were taken care of for chest pain. You are recommended to get cardiac stress testing. It was not done during this hospitalization according to your wishes. If you change your mind at a later date please do let us know. Thank you!

## 2017-09-04 NOTE — Progress Notes (Signed)
Pt found after returning from 2D echo to be non- responsive. VSS. CBG 156. Pt with some response to sternal rubbing, but did not awake up. RR called. MD paged. Gag reflex checked with some response from pt. Pt eventually woke up stating that she was just asleep and to stop messing with her. Call light in reach. Will continue to monitor.  Clyde Canterbury, RN

## 2017-09-05 LAB — BASIC METABOLIC PANEL
Anion gap: 11 (ref 5–15)
BUN: 31 mg/dL — ABNORMAL HIGH (ref 6–20)
CO2: 25 mmol/L (ref 22–32)
Calcium: 8.5 mg/dL — ABNORMAL LOW (ref 8.9–10.3)
Chloride: 102 mmol/L (ref 101–111)
Creatinine, Ser: 2.56 mg/dL — ABNORMAL HIGH (ref 0.44–1.00)
GFR calc Af Amer: 22 mL/min — ABNORMAL LOW (ref >60)
GFR calc non Af Amer: 19 mL/min — ABNORMAL LOW (ref >60)
Glucose, Bld: 327 mg/dL — ABNORMAL HIGH (ref 65–99)
Potassium: 4.2 mmol/L (ref 3.5–5.1)
Sodium: 138 mmol/L (ref 135–145)

## 2017-09-05 LAB — GLUCOSE, CAPILLARY
Glucose-Capillary: 300 mg/dL — ABNORMAL HIGH (ref 65–99)
Glucose-Capillary: 242 mg/dL — ABNORMAL HIGH (ref 65–99)
Glucose-Capillary: 167 mg/dL — ABNORMAL HIGH (ref 65–99)

## 2017-09-05 MED ORDER — FUROSEMIDE 40 MG PO TABS: 40.0000 mg | ORAL_TABLET | Freq: Every day | ORAL | 1 refills | Status: AC

## 2017-09-05 NOTE — Progress Notes (Signed)
Inpatient Diabetes Program Recommendations  AACE/ADA: New Consensus Statement on Inpatient Glycemic Control (2015)  Target Ranges:  Prepandial:   less than 140 mg/dL      Peak postprandial:   less than 180 mg/dL (1-2 hours)      Critically ill patients:  140 - 180 mg/dL   Results for April Pena, April Pena (MRN [id number]) as of 09/05/2017 11:08  Ref. Range 09/04/2017 08:53 09/04/2017 12:01 09/04/2017 16:02 09/04/2017 20:37 09/05/2017 00:53 09/05/2017 06:14  Glucose-Capillary Latest Ref Range: 65 - 99 mg/dL 169 (H) 228 (H) 153 (H) 278 (H) 300 (H) 242 (H)   Review of Glycemic Control  Diabetes history: DM2 Outpatient Diabetes medications: Levemir 20 units daily, Novolog 10-30 units TID with meals  Current orders for Inpatient glycemic control: Levemir 20 units QHS, Novolog 0-15 units TID with meals, Novolog 0-5 units QHS; Prednisone 5 mg daily  Inpatient Diabetes Program Recommendations: Insulin - Basal: Please consider increasing Levemir to 22 units QHS. Insulin - Meal Coverage: Please consider ordering Novolog 4 units TID with meals for meal coverage if patient eats at least 50% of meals (in addition to Novolog correction scale).  Thanks, Barnie Alderman, RN, MSN, CDE Diabetes Coordinator Inpatient Diabetes Program [phone] (Team Pager from 8am to 5pm)

## 2017-09-05 NOTE — Progress Notes (Signed)
Discharge summary reviewed and signed by patient.  Belongings collected by patient.  IV d/c'd without difficulty.   Daughter in route to pick up pt and pt will be assisted by staff to vehicle.   Anson Crofts RN

## 2017-09-05 NOTE — Discharge Summary (Signed)
Name: April Pena MRN: [id number] DOB: 1955/07/30 62 y.o. PCP: Guadlupe Spanish, MD  Date of Admission: 09/03/2017  8:01 AM Date of Discharge: 09/05/17 Attending Physician: Oda Kilts, MD  Discharge Diagnosis:  Principal Problem: Acute on chronic diastolic heart failure  CAD s/p CABG x3  Active Problems:   Essential hypertension   Diabetes mellitus type 2 in obese [facility])   S/p cadaver renal transplant 2011  Discharge Medications: Allergies as of 09/05/2017      Reactions   Iodinated Diagnostic Agents Other (See Comments)   Kidneys   Iron Hives, Other (See Comments)   Elevated blood sugar, glaucoma worsened,    Promethazine Other (See Comments)   Pt states her muscles jerk   Wellbutrin [bupropion Hcl] Nausea And Vomiting      Medication List    TAKE these medications   acetaminophen 500 MG tablet Commonly known as:  TYLENOL Take 500-1,000 mg by mouth every 6 (six) hours as needed (pain).   amLODipine 10 MG tablet Commonly known as:  NORVASC Take 1 tablet (10 mg total) by mouth daily.   aspirin EC 81 MG tablet Take 81 mg by mouth daily.   atorvastatin 20 MG tablet Commonly known as:  LIPITOR TAKE 1 TABLET BY MOUTH ONCE DAILY   cinacalcet 30 MG tablet Commonly known as:  SENSIPAR Take 30 mg by mouth every Monday, Wednesday, and Friday.   dexlansoprazole 60 MG capsule Commonly known as:  DEXILANT Take 60 mg by mouth daily.   dorzolamide-timolol 22.3-6.8 MG/ML ophthalmic solution Commonly known as:  COSOPT Place 1 drop into both eyes 2 (two) times daily.   furosemide 40 MG tablet Commonly known as:  LASIX Take 1 tablet (40 mg total) by mouth daily.   gabapentin 300 MG capsule Commonly known as:  NEURONTIN Take 1 capsule (300 mg total) by mouth daily.   iloperidone 4 MG Tabs tablet Commonly known as:  FANAPT Take 4 mg by mouth at bedtime.   Insulin Detemir 100 UNIT/ML Pen Commonly known as:  LEVEMIR FLEXTOUCH Inject 20 Units into the  skin daily.   isosorbide mononitrate 60 MG 24 hr tablet Commonly known as:  IMDUR Take 1 tablet (60 mg total) by mouth daily.   magnesium oxide 400 MG tablet Commonly known as:  MAG-OX Take 400 mg by mouth 2 (two) times daily.   metoprolol succinate 100 MG 24 hr tablet Commonly known as:  TOPROL-XL Take 1 tablet (100 mg total) by mouth daily. Take with or immediately following a meal.   mycophenolate 180 MG EC tablet Commonly known as:  MYFORTIC Take 360 mg by mouth 2 (two) times daily.   nitroGLYCERIN 0.4 MG SL tablet Commonly known as:  NITROSTAT Place 0.4 mg under the tongue every 5 (five) minutes as needed for chest pain.   NOVOLOG FLEXPEN 100 UNIT/ML FlexPen Generic drug:  insulin aspart Inject 10-30 Units into the skin 3 (three) times daily with meals. Based on CBG or carb count   oxyCODONE-acetaminophen 7.5-325 MG tablet Commonly known as:  PERCOCET Take one or two tablet every 4-6 hours PRN severe pain.   prednisoLONE acetate 1 % ophthalmic suspension Commonly known as:  PRED FORTE Place 1 drop into the left eye 4 (four) times daily as needed (itching).   predniSONE 5 MG tablet Commonly known as:  DELTASONE Take 5 mg by mouth daily.   PROGRAF 1 MG capsule Generic drug:  tacrolimus Take 2-3 mg by mouth See admin instructions. Take 3 capsules (  3 mg) by mouth every morning and 2.5 capsules (2.5 mg) at night   sodium bicarbonate 650 MG tablet Take 650 mg by mouth 2 (two) times daily.   sulfamethoxazole-trimethoprim 400-80 MG tablet Commonly known as:  BACTRIM,SEPTRA Take 1 tablet by mouth every Monday, Wednesday, and Friday.       Disposition and follow-up:   Ms.April Pena was discharged from [facility] in stable condition.  At the hospital follow up visit please address:  1.  Acute on chronic diastolic heart failure, CAD s/p CABG x3: please assess the patient's volume status on follow up. Please continue to encourage the patient to get  a cardiac stress test to determine whether she has progression of her cad.   Unresponsive event: please make sure that the patient has not had another unresponsive event  2.  Labs / imaging needed at time of follow-up: none  3.  Pending labs/ test needing follow-up: none  Follow-up Appointments: Follow-up Information    Barrett, Evelene Croon, PA-C Follow up on 09/26/2017.   Specialties:  Cardiology, Radiology Why:  Please arrive 15 minutes early for your 3:00pm Cardiology appointment Contact information: [address] [place] [address] [place] Hospital Course by problem list:  Acute on chronic diastolic heart failure  CAD s/p CABG x3 The patient presented with 3-4 day history of chest pain. Her troponin is negative, no ecg changes, and BNP is elevated. CXR shows some pumonary vascular congestion. She was volume overloaded on admission and diuresed for presumed HFpEF exacerbation secondary to poor medication and dietary compliance.The patient denied stress testing and therefore cardiology recommended echo with outpatient follow up.TTE showed lvef 55-60%, g2dd, no regional wall motion abnormalities, moderate lvh, mildly calcified aortic valve,and elevated pulmonary artery pressure. The patient diuresed a total of 4L during admission and her weight went down from 237lbs to 230lbs.   Unresponsive event: During the hospitalization the patient was not able to be woken up easily. Rapid response had to be called and the paitient did not react to sternal rub or painful stimuli. She was woken up after gag reflex was done. After the patient became more alert the patient stated that she was just sleeping and did not want to wake up.   Discharge Vitals:   BP (!) 146/70 (BP Location: Left Arm)   Pulse 63   Temp 97.9 F (36.6 C) (Oral)   Resp (!) 26   Ht 5\' 4"  (1.626 m)   Wt 230 lb 3.2 oz (104.4 kg)   SpO2 96%   BMI 39.51 kg/m   Pertinent Labs, Studies, and  Procedures:  BMP Latest Ref Rng & Units 09/05/2017 09/04/2017 09/03/2017  Glucose 65 - 99 mg/dL 327(H) 211(H) 204(H)  BUN 6 - 20 mg/dL 31(H) 27(H) 29(H)  Creatinine 0.44 - 1.00 mg/dL 2.56(H) 2.10(H) 1.94(H)  Sodium 135 - 145 mmol/L 138 139 141  Potassium 3.5 - 5.1 mmol/L 4.2 5.9(H) 4.9  Chloride 101 - 111 mmol/L 102 106 110  CO2 22 - 32 mmol/L 25 22 22   Calcium 8.9 - 10.3 mg/dL 8.5(L) 8.3(L) 8.2(L)   CBC Latest Ref Rng & Units 09/04/2017 09/03/2017 05/10/2017  WBC 4.0 - 10.5 K/uL 3.6(L) 3.4(L) 5.2  Hemoglobin 12.0 - 15.0 g/dL 9.9(L) 9.7(L) 10.5(L)  Hematocrit 36.0 - 46.0 % 32.4(L) 32.2(L) 33.5(L)  Platelets 150 - 400 K/uL 149(L) 147(L) 230   Chest xray (09/03/17): Pulmonary vascular congestion and mild interstitial edema.  Discharge Instructions:   Signed: Lars Mage, MD 09/05/2017, 2:10 PM   Pager: [phone]

## 2017-09-05 NOTE — Evaluation (Signed)
Occupational Therapy Evaluation Patient Details Name: April Pena MRN: [id number] DOB: [birth year] Today's Date: 09/05/2017    History of Present Illness Pt adm with chest pain and found to have acute on chronic diastolic heart failure. Pt with unresponsive episode 3/28 PM which pt reports she was just sleeping. PMH - DM, impaired vision, CAD, CABG, HTN,    Clinical Impression   Patient evaluated by Occupational Therapy with no further acute OT needs identified. All education has been completed and the patient has no further questions. Pt is mod I with ADLs and feels she is at/or close to baseline.  See below for any follow-up Occupational Therapy or equipment needs. OT is signing off. Thank you for this referral.      Follow Up Recommendations  No OT follow up    Equipment Recommendations  None recommended by OT    Recommendations for Other Services       Precautions / Restrictions Precautions Precautions: None Restrictions Weight Bearing Restrictions: No      Mobility Bed Mobility Overal bed mobility: Independent             General bed mobility comments: Pt up at sink with OT  Transfers Overall transfer level: Modified independent Equipment used: None                  Balance Overall balance assessment: No apparent balance deficits (not formally assessed)                                         ADL either performed or assessed with clinical judgement   ADL Overall ADL's : Modified independent                                       General ADL Comments: Pt able to retrieve items from floor.   She feels she is back to baseline      Vision Baseline Vision/History: Retinopathy Patient Visual Report: No change from baseline       Perception     Praxis      Pertinent Vitals/Pain Pain Assessment: No/denies pain     Hand Dominance Left   Extremity/Trunk Assessment Upper Extremity Assessment Upper Extremity  Assessment: Overall WFL for tasks assessed   Lower Extremity Assessment Lower Extremity Assessment: Defer to PT evaluation   Cervical / Trunk Assessment Cervical / Trunk Assessment: Normal   Communication Communication Communication: No difficulties   Cognition Arousal/Alertness: Awake/alert Behavior During Therapy: WFL for tasks assessed/performed Overall Cognitive Status: Within Functional Limits for tasks assessed                                     General Comments  VSS     Exercises     Shoulder Instructions      Home Living Family/patient expects to be discharged to:: Private residence Living Arrangements: Alone   Type of Home: Apartment Home Access: Level entry     Home Layout: One level     Bathroom Shower/Tub: Tub/shower unit;Curtain   Bathroom Toilet: Handicapped height     Home Equipment: Environmental consultant - 4 wheels          Prior Functioning/Environment Level of Independence: Needs assistance  Gait / Transfers Assistance  Needed: Modified independent uses rollator  in the community ADL's / Homemaking Assistance Needed: mod I    Comments: Pt has assist with transportation.   She does report that she has difficulty getting groceries due to visual deficits and difficulty carrying the bags into her apt         OT Problem List: Decreased activity tolerance      OT Treatment/Interventions:      OT Goals(Current goals can be found in the care plan section) Acute Rehab OT Goals Patient Stated Goal: to go home  OT Goal Formulation: All assessment and education complete, DC therapy  OT Frequency:     Barriers to D/C:            Co-evaluation              AM-PAC PT "6 Clicks" Daily Activity     Outcome Measure Help from another person eating meals?: None Help from another person taking care of personal grooming?: None Help from another person toileting, which includes using toliet, bedpan, or urinal?: None Help from another person  bathing (including washing, rinsing, drying)?: None Help from another person to put on and taking off regular upper body clothing?: None Help from another person to put on and taking off regular lower body clothing?: None 6 Click Score: 24   End of Session Equipment Utilized During Treatment: Rolling walker Nurse Communication: Mobility status  Activity Tolerance: Patient tolerated treatment well Patient left: Other (comment)(with PT )  OT Visit Diagnosis: Low vision, both eyes (H54.2)                Time: 7408-1448 OT Time Calculation (min): 16 min Charges:  OT General Charges $OT Visit: 1 Visit OT Evaluation $OT Eval Moderate Complexity: 1 Mod G-Codes:     [facility], OTR/L [phone]   Lucille Passy M 09/05/2017, 11:44 AM

## 2017-09-05 NOTE — Care Management Note (Signed)
Case Management Note Marvetta Gibbons RN, BSN Unit 4E-Case Manager [phone]  Patient Details  Name: April Pena MRN: [id number] Date of Birth: 12-07-1955  Subjective/Objective:   Pt admitted with CHF                Action/Plan: PTA pt lived at home alone, has RW at home, no CM needs noted for transition home- for d/c home today.   Expected Discharge Date:  09/05/17               Expected Discharge Plan:  Home/Self Care  In-House Referral:  NA  Discharge planning Services  CM Consult  Post Acute Care Choice:  NA Choice offered to:  NA  DME Arranged:    DME Agency:     HH Arranged:    HH Agency:     Status of Service:  Completed, signed off  If discussed at [place] of Stay Meetings, dates discussed:    Discharge Disposition: home/self care   Additional Comments:  April Patricia, RN 09/05/2017, 11:29 AM

## 2017-09-05 NOTE — Evaluation (Signed)
Physical Therapy Evaluation Patient Details Name: April Pena MRN: [id number] DOB: 21-Nov-1955 Today's Date: 09/05/2017   History of Present Illness  Pt adm with chest pain and found to have acute on chronic diastolic heart failure. Pt with unresponsive episode 3/28 PM which pt reports she was just sleeping. PMH - DM, impaired vision, CAD, CABG, HTN,   Clinical Impression  Pt doing well with mobility and no further PT needed.  Ready for dc from PT standpoint.      Follow Up Recommendations No PT follow up    Equipment Recommendations  None recommended by PT    Recommendations for Other Services       Precautions / Restrictions Precautions Precautions: None Restrictions Weight Bearing Restrictions: No      Mobility  Bed Mobility               General bed mobility comments: Pt up at sink with OT  Transfers Overall transfer level: Modified independent Equipment used: None                Ambulation/Gait Ambulation/Gait assistance: Modified independent (Device/Increase time) Ambulation Distance (Feet): 450 Feet Assistive device: 4-wheeled walker Gait Pattern/deviations: WFL(Within Functional Limits)   Gait velocity interpretation: at or above normal speed for age/gender General Gait Details: steady gait with or without rollator  Stairs            Wheelchair Mobility    Modified Rankin (Stroke Patients Only)       Balance Overall balance assessment: No apparent balance deficits (not formally assessed)(pt reached to floor to pick up item and to fix socks)                                           Pertinent Vitals/Pain Pain Assessment: No/denies pain    Home Living Family/patient expects to be discharged to:: Private residence Living Arrangements: Alone   Type of Home: Apartment Home Access: Level entry     Home Layout: One level Home Equipment: Environmental consultant - 4 wheels      Prior Function Level of Independence: Needs  assistance   Gait / Transfers Assistance Needed: Modified independent uses rollator  in the community           Hand Dominance   Dominant Hand: Left    Extremity/Trunk Assessment   Upper Extremity Assessment Upper Extremity Assessment: Defer to OT evaluation    Lower Extremity Assessment Lower Extremity Assessment: Overall WFL for tasks assessed       Communication   Communication: No difficulties  Cognition Arousal/Alertness: Awake/alert Behavior During Therapy: WFL for tasks assessed/performed Overall Cognitive Status: Within Functional Limits for tasks assessed                                        General Comments      Exercises     Assessment/Plan    PT Assessment Patent does not need any further PT services  PT Problem List         PT Treatment Interventions      PT Goals (Current goals can be found in the Care Plan section)  Acute Rehab PT Goals PT Goal Formulation: All assessment and education complete, DC therapy    Frequency     Barriers to discharge  Co-evaluation               AM-PAC PT "6 Clicks" Daily Activity  Outcome Measure Difficulty turning over in bed (including adjusting bedclothes, sheets and blankets)?: None Difficulty moving from lying on back to sitting on the side of the bed? : None Difficulty sitting down on and standing up from a chair with arms (e.g., wheelchair, bedside commode, etc,.)?: None Help needed moving to and from a bed to chair (including a wheelchair)?: None Help needed walking in hospital room?: None Help needed climbing 3-5 steps with a railing? : None 6 Click Score: 24    End of Session   Activity Tolerance: Patient tolerated treatment well Patient left: in chair;with call bell/phone within reach Nurse Communication: Mobility status PT Visit Diagnosis: Other abnormalities of gait and mobility (R26.89)    Time: 1610-9604 PT Time Calculation (min) (ACUTE ONLY): 9  min   Charges:   PT Evaluation $PT Eval Low Complexity: 1 Low     PT G CodesMarland Kitchen        [facility] PT [place] 09/05/2017, 11:01 AM

## 2017-09-05 NOTE — Progress Notes (Addendum)
Subjective: April Pena was seen resting in her bed this morning doing well. She stated that she is breathing well and does not have any chest pain currently. This morning when I went to examine patient she was asleep and I was able to wake her up, but she would fall back asleep quickly.   Objective:  Vital signs in last 24 hours: Vitals:   09/05/17 0126 09/05/17 0331 09/05/17 0700 09/05/17 0805  BP:  (!) 153/65 (!) 146/70 (!) 146/70  Pulse:  (!) 57 (!) 50 63  Resp:  18 15   Temp:  97.8 F (36.6 C)  97.9 F (36.6 C)  TempSrc:  Oral  Oral  SpO2:  98% 93% 96%  Weight: 230 lb 3.2 oz (104.4 kg)     Height:       Physical Exam  Constitutional: She appears well-developed and well-nourished. No distress.  HENT:  Head: Normocephalic and atraumatic.  Eyes: Conjunctivae are normal.  Cardiovascular: Normal rate, regular rhythm and normal heart sounds.  Respiratory: Effort normal and breath sounds normal. No respiratory distress. She has no wheezes.  GI: Soft. Bowel sounds are normal. She exhibits no distension. There is no tenderness.  Musculoskeletal: She exhibits no edema.  Neurological: She is alert.  Skin: She is not diaphoretic. No erythema.  Psychiatric: She has a normal mood and affect. Her behavior is normal. Judgment and thought content normal.   Assessment/Plan:  April Pena is a 62 yr old f withcoronary artery disease s/pCABG x3,essential hypertension, DMtype 2, CKD3 s/ptransplant, HFpEFwho presented with chest pain for the past 3-4 days. Troponin negative, no ecg changes, BNP elevated. CXR shows some pumonary vascular congestion. Appears volume overloaded. Diuresing for presumed HFpEF exacerbation secondary to poor medication and dietary compliance. The patient denies stress testing and therefore cardiology recommended echo with outpatient follow up.   Acute on chronic diastolic heart failure The patient put out a total of 3.1L urine over the past 24 hrs and is -4.4L since  admission. Her weight is 230lbs from 237lbs yesterday. She appeared euvolemic on exam.  Patient got echo done yesterday which showed mildly calcified annulus, mildly fibrotic aortic annulus, LVef 55-60%, and G2DD, pa pressure=35, CVP=3. The findings are mildly worse from prior echo in 2017 which showed ef 65-70%, mitral annular calcification, G1DD.   Patient continues to get stress testing done and therefore cardiology cannot make any further recommendations.   -Will discharge patient with po Lasix 40mg  qd -strict I/O -monitor daily weights  CAD s/p CABGx3 Patient is high risk for another coronary event, however she refuses to have stress testing including pharmacologic. She is not a good candidate for cardiac ct due to obesity and prior cabg which will make visualization difficult.   Patient's pulse has ranged 50-60s during admission which in line with her chronically low heartrate since since 2017 after she had her CABG. Troponins have remained negative.  -continue imdur 60mg  qd -continue metoprolol 100mg  qd -nitroglycerin q83min prn  Essential Hypertension The patient's weight since admission has ranged 137-155/67-75.The patient is on amlodipine 10 mg daily.  -continue home amlodipine 10mg  qd  Diabetes mellitus type 2 The patient's A1c is8.5 and her blood glucose has ranged 200-300s over the past 24 hrs.The patient is onlevemir 20u qd, novolog 10-30u tid qd based on cbg or carb count at home.   The patient needed 17u of aspart over the course of the day yesterday. Will continue to monitor today to determine whther patient continues to necessitate extra  insulin. Patient should follow with outpatient provider.  -Continue Levemir 20u qhs -SSI tidwc and qhs  CKD s/p transplant  -continue home mycofenolate 360mg  bid -bactrim 400-80mg  qmwf -continue tacrolimus 3mg  qam and 2.5mg  qhs   Dispo: Anticipated discharge in approximately today. Patient does not have any PT  needs and can be discharged home.  Lars Mage, MD 09/05/2017, 10:15 AM Pager: [phone]

## 2017-09-08 LAB — GLUCOSE, CAPILLARY: Glucose-Capillary: 296 mg/dL — ABNORMAL HIGH (ref 65–99)

## 2017-09-23 ENCOUNTER — Other Ambulatory Visit: Payer: Self-pay | Admitting: Cardiology

## 2017-09-26 ENCOUNTER — Ambulatory Visit: Payer: Medicaid Other | Admitting: Physician Assistant

## 2017-10-07 ENCOUNTER — Encounter: Payer: Self-pay | Admitting: Physician Assistant

## 2017-10-07 ENCOUNTER — Ambulatory Visit: Payer: Medicaid Other | Admitting: Physician Assistant

## 2017-10-07 VITALS — BP 142/72 | HR 63 | Ht 64.0 in | Wt 228.0 lb

## 2017-10-07 DIAGNOSIS — N183 Chronic kidney disease, stage 3 unspecified: Secondary | ICD-10-CM

## 2017-10-07 DIAGNOSIS — I5032 Chronic diastolic (congestive) heart failure: Secondary | ICD-10-CM | POA: Diagnosis not present

## 2017-10-07 DIAGNOSIS — I10 Essential (primary) hypertension: Secondary | ICD-10-CM | POA: Diagnosis not present

## 2017-10-07 DIAGNOSIS — Z951 Presence of aortocoronary bypass graft: Secondary | ICD-10-CM

## 2017-10-07 LAB — BASIC METABOLIC PANEL
BUN / CREAT RATIO: 16 (ref 12–28)
BUN: 46 mg/dL — AB (ref 8–27)
CHLORIDE: 103 mmol/L (ref 96–106)
CO2: 23 mmol/L (ref 20–29)
Calcium: 8.6 mg/dL — ABNORMAL LOW (ref 8.7–10.3)
Creatinine, Ser: 2.9 mg/dL — ABNORMAL HIGH (ref 0.57–1.00)
GFR calc Af Amer: 19 mL/min/{1.73_m2} — ABNORMAL LOW (ref >59)
GFR calc non Af Amer: 17 mL/min/{1.73_m2} — ABNORMAL LOW (ref >59)
GLUCOSE: 79 mg/dL (ref 65–99)
Potassium: 4.7 mmol/L (ref 3.5–5.2)
Sodium: 145 mmol/L — ABNORMAL HIGH (ref 134–144)

## 2017-10-07 MED ORDER — ISOSORBIDE MONONITRATE ER 30 MG PO TB24: 30.0000 mg | ORAL_TABLET | Freq: Every day | ORAL | 3 refills | Status: DC

## 2017-10-07 MED ORDER — ISOSORBIDE MONONITRATE ER 60 MG PO TB24: 60.0000 mg | ORAL_TABLET | Freq: Every day | ORAL | 3 refills | Status: DC

## 2017-10-07 NOTE — Patient Instructions (Addendum)
Increase Imdur to 90 mg daily ( 60 mg and 30 mg )   Lab today ( bmet )   500 mg of sodium with each meal   1 to 2 quarts of liquid daily  ( water mostly )    Follow Up Appointment with Dr.Hochrein Monday 12/29/17 at 10:40 am

## 2017-10-07 NOTE — Progress Notes (Signed)
Cardiology Office Note   Date:  10/07/2017   ID:  April Pena, DOB 10-31-1955, MRN [id number]  PCP:  Guadlupe Spanish, MD  Cardiologist: Dr. Percival Spanish, 08/16/2016 Rosaria Ferries, PA-C   No chief complaint on file.   History of Present Illness: April Pena is a 62 y.o. female with a history of NSTEMI and CABG 2017, D-CHF, HTN, HLD, DM2 on insulin, ESRD s/p renal transplant with ongoing CKD stage 3, IV Dye allergyand glaucoma, +OSA, did not tolerate  Admitted 3/27- 09/05/2017 for atypical chest pain, troponin negative, acute on chronic D CHF, patient refuses Lexi Myoview, EF normal with grade 2 diastolic dysfunction, discharge weight 230 pounds, episode of decreased level consciousness noted when the patient was difficult to awaken (noncardiac)  April Pena presents for cardiology follow up.  She gets out of breath quickly w/ exertion. She is afraid to walk outside without someone with her because she is afraid she will get chest pain or pass out. She denies having any friends. She is not interested in any outings or going to the [facility]. She listens to [employer] every Sunday. She is not interested in going to church. She listens to [employer]. She walks around the apartment and cleans it.  That is her activity.  She does some exercises with her upper body.   Not waking with LE edema, no PND. She sleeps on a wedge and pillows, no recent change.  Her weight on her home scales is 226, was as low as 224.  She tries to get lower sodium foods, but feels significantly limited by her income.  She buys what she can afford.  She gets chest pain, a twinge. She will get this with exertion and will stop. Her worst episode was 7-8/10, took 1 sl NTG and the pain resolved. She has had 2 episodes of CP since she left the hospital. She can sometimes make the pain go away by lying down and rubbing her lower L chest, below L breast.   BP today 142/72, normal for her. Sometimes lower, but  not usually.    Past Medical History:  Diagnosis Date  . Blind left eye   . Blood transfusion without reported diagnosis   . CHF (congestive heart failure) ([place])   . Diabetes mellitus   . Diabetes mellitus type 2 in obese ([place]) 04/11/2016  . Glaucoma   . Glaucoma   . Groin hematoma, initial encounter 04/11/2016  . Hyperlipidemia 04/11/2016  . Hypertension   . Hypertensive heart disease 04/11/2016  . MI (myocardial infarction) ([place])   . Renal disorder    kidney transplant  . Unstable angina [facility])     Past Surgical History:  Procedure Laterality Date  . ABDOMINAL HYSTERECTOMY    . CARDIAC CATHETERIZATION N/A 04/08/2016   Procedure: Left Heart Cath and Coronary Angiography;  Surgeon: Burnell Blanks, MD;  Location: [place] [facility];  Service: Cardiovascular;  Laterality: N/A;  . CATARACT EXTRACTION    . CHOLECYSTECTOMY    . CORONARY ARTERY BYPASS GRAFT N/A 04/12/2016   Procedure: CORONARY ARTERY BYPASS GRAFTING (CABG) times three using left saphenous vein harvested endoscopically;  Surgeon: Gaye Pollack, MD;  Location: [place] OR;  Service: Open Heart Surgery;  Laterality: N/A;  SEQ SVG-OM1-OM2 SVG-PD  . EYE SURGERY     cataract  . KIDNEY TRANSPLANT  2011  . TEE WITHOUT CARDIOVERSION N/A 04/12/2016   Procedure: TRANSESOPHAGEAL ECHOCARDIOGRAM (TEE);  Surgeon: Gaye Pollack, MD;  Location: [place];  Service: Open Heart Surgery;  Laterality: N/A;  . TONSILLECTOMY    . TRIGGER FINGER RELEASE      Current Outpatient Medications  Medication Sig Dispense Refill  . acetaminophen (TYLENOL) 500 MG tablet Take 500-1,000 mg by mouth every 6 (six) hours as needed (pain).     Marland Kitchen amLODipine (NORVASC) 10 MG tablet Take 1 tablet (10 mg total) by mouth daily. 30 tablet 6  . aspirin EC 81 MG tablet Take 81 mg by mouth daily.    Marland Kitchen atorvastatin (LIPITOR) 20 MG tablet TAKE 1 TABLET BY MOUTH ONCE DAILY 30 tablet 0  . cinacalcet (SENSIPAR) 30 MG tablet Take 30 mg by mouth every Monday, Wednesday,  and Friday.    Marland Kitchen dexlansoprazole (DEXILANT) 60 MG capsule Take 60 mg by mouth daily.    . dorzolamide-timolol (COSOPT) 22.3-6.8 MG/ML ophthalmic solution Place 1 drop into both eyes 2 (two) times daily.    . furosemide (LASIX) 40 MG tablet Take 1 tablet (40 mg total) by mouth daily. 30 tablet 1  . gabapentin (NEURONTIN) 300 MG capsule Take 1 capsule (300 mg total) by mouth daily.    Marland Kitchen iloperidone (FANAPT) 4 MG TABS tablet Take 4 mg by mouth at bedtime.    . insulin aspart (NOVOLOG FLEXPEN) 100 UNIT/ML FlexPen Inject 10-30 Units into the skin 3 (three) times daily with meals. Based on CBG or carb count    . Insulin Detemir (LEVEMIR FLEXTOUCH) 100 UNIT/ML Pen Inject 20 Units into the skin daily. 15 mL 11  . isosorbide mononitrate (IMDUR) 60 MG 24 hr tablet Take 1 tablet (60 mg total) by mouth daily. 30 tablet 11  . magnesium oxide (MAG-OX) 400 MG tablet Take 400 mg by mouth 2 (two) times daily.    . metoprolol succinate (TOPROL-XL) 100 MG 24 hr tablet Take 1 tablet (100 mg total) by mouth daily. Take with or immediately following a meal. 30 tablet 6  . mycophenolate (MYFORTIC) 180 MG EC tablet Take 360 mg by mouth 2 (two) times daily.     . nitroGLYCERIN (NITROSTAT) 0.4 MG SL tablet Place 0.4 mg under the tongue every 5 (five) minutes as needed for chest pain.    Marland Kitchen oxyCODONE-acetaminophen (PERCOCET) 7.5-325 MG tablet Take one or two tablet every 4-6 hours PRN severe pain. 28 tablet 0  . prednisoLONE acetate (PRED FORTE) 1 % ophthalmic suspension Place 1 drop into the left eye 4 (four) times daily as needed (itching).    . predniSONE (DELTASONE) 5 MG tablet Take 5 mg by mouth daily.      . sodium bicarbonate 650 MG tablet Take 650 mg by mouth 2 (two) times daily.    Marland Kitchen sulfamethoxazole-trimethoprim (BACTRIM,SEPTRA) 400-80 MG per tablet Take 1 tablet by mouth every Monday, Wednesday, and Friday.     . tacrolimus (PROGRAF) 1 MG capsule Take 2-3 mg by mouth See admin instructions. Take 3 capsules (3 mg)  by mouth every morning and 2.5 capsules (2.5 mg) at night     No current facility-administered medications for this visit.     Allergies:   Iodinated diagnostic agents; Iron; Promethazine; and Wellbutrin [bupropion hcl]    Social History:  The patient  reports that she has quit smoking. She has never used smokeless tobacco. She reports that she does not drink alcohol or use drugs.   Family History:  The patient's family history includes Diabetes in her brother, daughter, father, mother, and paternal grandmother; Heart attack in her mother; Hypertension in her brother, daughter, mother,  and sister.    ROS:  Please see the history of present illness. All other systems are reviewed and negative.    PHYSICAL EXAM: VS:  BP (!) 142/72   Pulse 63   Ht 5\' 4"  (1.626 m)   Wt 228 lb (103.4 kg)   SpO2 94%   BMI 39.14 kg/m  , BMI Body mass index is 39.14 kg/m. GEN: Well nourished, well developed, female in no acute distress  HEENT: normal for age  Neck: no JVD, no carotid bruit, no masses Cardiac: RRR; soft murmur, no rubs, or gallops Respiratory:  clear to auscultation bilaterally, normal work of breathing GI: soft, nontender, nondistended, + BS MS: no deformity or atrophy; trace lower extremity edema; distal pulses are 2+ in all 4 extremities   Skin: warm and dry, no rash Neuro:  Strength and sensation are intact Psych: euthymic mood, full affect   EKG:  EKG is not ordered today.   ECHO: 09/04/2017 - Left ventricle: The cavity size was normal. Wall thickness was   increased in a pattern of moderate LVH. Systolic function was   normal. The estimated ejection fraction was in the range of 55%   to 60%. Wall motion was normal; there were no regional wall   motion abnormalities. Features are consistent with a pseudonormal   left ventricular filling pattern, with concomitant abnormal   relaxation and increased filling pressure (grade 2 diastolic   dysfunction). - Aortic valve: Mildly  calcified annulus. Trileaflet; normal   thickness, mildly calcified leaflets. Valve area (Vmax): 1.88 cm^2. - Mitral valve: Mildly fibrotic annulus. There was mild   regurgitation. - Pulmonary arteries: Systolic pressure was mildly increased. PA   peak pressure: 35 mm Hg (S).   Recent Labs: 09/03/2017: B Natriuretic Peptide 919.0 09/04/2017: ALT 56; Hemoglobin 9.9; Platelets 149 09/05/2017: BUN 31; Creatinine, Ser 2.56; Potassium 4.2; Sodium 138    Lipid Panel    Component Value Date/Time   CHOL 149 04/08/2016 0345   TRIG 71 04/08/2016 0345   HDL 61 04/08/2016 0345   CHOLHDL 2.4 04/08/2016 0345   VLDL 14 04/08/2016 0345   LDLCALC 74 04/08/2016 0345     Wt Readings from Last 3 Encounters:  10/07/17 228 lb (103.4 kg)  09/05/17 230 lb 3.2 oz (104.4 kg)  05/10/17 230 lb (104.3 kg)     Other studies Reviewed: Additional studies/ records that were reviewed today include: Office notes, hospital records and testing.  ASSESSMENT AND PLAN:  1.  Chronic diastolic CHF: Her weight is stable and she is tracking it at home. -Continue current therapy with Lasix 40 mg daily.  2.  Chronic kidney disease, stage III: Her BUN and creatinine were 31/2.56 at discharge. Recheck today.  3.  CAD, s/p CABG with occasional chest pain: She still refuses a Myoview -She does not have consistent chest pain with exertion.  The episodes are brief, infrequent and are not consistently associated with exertion.  She has taken sublingual nitroglycerin once with relief. -Continue aspirin, statin, metoprolol. -Increase Imdur from 60 up to 90 mg daily.  See if this helps her symptoms. -If symptoms get more consistent or more severe, consider cath for now, no further testing  4.  Hypertension: Her blood pressure is not at target.  Concerned that her chest pain episodes are in the setting of poorly controlled hypertension. -Compliance with medications is encouraged.  Increase Imdur and see if this is enough.  If  not, may need to add hydralazine   Current medicines  are reviewed at length with the patient today.  The patient does not have concerns regarding medicines.  The following changes have been made: Increase Imdur  Labs/ tests ordered today include:   Orders Placed This Encounter  Procedures  . Basic metabolic panel     Disposition:   FU with Dr. Percival Spanish  Signed, Rosaria Ferries, PA-C  10/07/2017 1:35 PM    [place] Phone: [phone]; Fax: [phone]  This note was written with the assistance of speech recognition software. Please excuse any transcriptional errors.

## 2017-10-09 ENCOUNTER — Telehealth: Payer: Self-pay | Admitting: Cardiology

## 2017-10-09 NOTE — Telephone Encounter (Addendum)
Spoke with Rep from [employer] and advised that based on last office visit, April Barrett, PA increased Imdur from 60 mg to 90 mg. Verbalized Understanding.

## 2017-10-09 NOTE — Telephone Encounter (Signed)
New Message:      Pt c/o medication issue:  1. Name of Medication: isosorbide mononitrate (IMDUR) 60 MG 24 hr tablet  2. How are you currently taking this medication (dosage and times per day)? Take 1 tablet (60 mg total) by mouth daily.  3. Are you having a reaction (difficulty breathing--STAT)? No  4. What is your medication issue? Will calling from [facility] to confirm whether the pt's prescription should be 30 mg or 60 mg. He states pt has been taking 60 mg.

## 2017-10-28 ENCOUNTER — Other Ambulatory Visit: Payer: Self-pay | Admitting: Cardiology

## 2017-10-29 NOTE — Telephone Encounter (Signed)
Rx sent to pharmacy   

## 2017-12-25 ENCOUNTER — Ambulatory Visit: Payer: Medicaid Other | Admitting: Cardiology

## 2017-12-29 ENCOUNTER — Ambulatory Visit: Payer: Medicaid Other | Admitting: Cardiology

## 2018-01-01 ENCOUNTER — Encounter: Payer: Self-pay | Admitting: Cardiology

## 2018-01-01 NOTE — Progress Notes (Signed)
Cardiology Office Note   Date:  01/02/2018   ID:  April Pena, DOB November 19, 1955, MRN [id number]  PCP:  Guadlupe Spanish, MD  Cardiologist:   Minus Breeding, MD    Chief Complaint  Patient presents with  . Chest Pain      History of Present Illness: April Pena is a 62 y.o. female who presents for follow up of CAD.   In October 2017she had an NSTEMI. She underwent CABG x 3 with SVG-OM1-OM2 and SVG-PDA 04/12/16. She was last admitted in March with chest pain that was atypical.  She ruled out and refused a [employer].  She has had some chronic chest chronic chest pain since then.    Since then she rarely gets chest discomfort under her left breast.  She might take a nitroglycerin occasionally but she thinks this is a mild chronic discomfort.  She has some knee problems so she walks with a walker but she can walk through the grocery store.  She goes to family functions.  She cannot bring on any symptoms.  She denies any shortness of breath, PND or orthopnea.  She had no new palpitations, presyncope or syncope.   Past Medical History:  Diagnosis Date  . Blind left eye   . CHF (congestive heart failure) ([place])   . Diabetes mellitus   . Diabetes mellitus type 2 in obese ([place]) 04/11/2016  . Glaucoma   . Hyperlipidemia 04/11/2016  . Hypertension   . Hypertensive heart disease 04/11/2016  . Kidney failure    s/p renal transplant  . MI (myocardial infarction) ([place])   . Unstable angina [facility])     Past Surgical History:  Procedure Laterality Date  . ABDOMINAL HYSTERECTOMY    . CARDIAC CATHETERIZATION N/A 04/08/2016   Procedure: Left Heart Cath and Coronary Angiography;  Surgeon: Burnell Blanks, MD;  Location: [place] [facility];  Service: Cardiovascular;  Laterality: N/A;  . CATARACT EXTRACTION    . CHOLECYSTECTOMY    . CORONARY ARTERY BYPASS GRAFT N/A 04/12/2016   Procedure: CORONARY ARTERY BYPASS GRAFTING (CABG) times three using left saphenous vein harvested  endoscopically;  Surgeon: Gaye Pollack, MD;  Location: [place] OR;  Service: Open Heart Surgery;  Laterality: N/A;  SEQ SVG-OM1-OM2 SVG-PD  . EYE SURGERY     cataract  . KIDNEY TRANSPLANT  2011  . TEE WITHOUT CARDIOVERSION N/A 04/12/2016   Procedure: TRANSESOPHAGEAL ECHOCARDIOGRAM (TEE);  Surgeon: Gaye Pollack, MD;  Location: [place];  Service: Open Heart Surgery;  Laterality: N/A;  . TONSILLECTOMY    . TRIGGER FINGER RELEASE       Current Outpatient Medications  Medication Sig Dispense Refill  . acetaminophen (TYLENOL) 500 MG tablet Take 500-1,000 mg by mouth every 6 (six) hours as needed (pain).     Marland Kitchen amLODipine (NORVASC) 10 MG tablet Take 1 tablet (10 mg total) by mouth daily. 30 tablet 6  . aspirin EC 81 MG tablet Take 81 mg by mouth daily.    Marland Kitchen atorvastatin (LIPITOR) 20 MG tablet Take 1 tablet (20 mg total) by mouth daily. Keep OV 30 tablet 2  . cinacalcet (SENSIPAR) 30 MG tablet Take 30 mg by mouth every Monday, Wednesday, and Friday.    Marland Kitchen dexlansoprazole (DEXILANT) 60 MG capsule Take 60 mg by mouth daily.    . dorzolamide-timolol (COSOPT) 22.3-6.8 MG/ML ophthalmic solution Place 1 drop into both eyes 2 (two) times daily.    . furosemide (LASIX) 40 MG tablet Take 1 tablet (40  mg total) by mouth daily. 30 tablet 1  . gabapentin (NEURONTIN) 300 MG capsule Take 1 capsule (300 mg total) by mouth daily.    Marland Kitchen iloperidone (FANAPT) 4 MG TABS tablet Take 4 mg by mouth at bedtime.    . insulin aspart (NOVOLOG FLEXPEN) 100 UNIT/ML FlexPen Inject 10-30 Units into the skin 3 (three) times daily with meals. Based on CBG or carb count    . Insulin Detemir (LEVEMIR FLEXTOUCH) 100 UNIT/ML Pen Inject 20 Units into the skin daily. 15 mL 11  . isosorbide mononitrate (IMDUR) 30 MG 24 hr tablet Take 1 tablet (30 mg total) by mouth daily. (Patient taking differently: Take 30 mg by mouth daily. Take 1/2 of 60 mg tablet daily) 90 tablet 3  . isosorbide mononitrate (IMDUR) 60 MG 24 hr tablet Take 1 tablet (60 mg  total) by mouth daily. 90 tablet 3  . magnesium oxide (MAG-OX) 400 MG tablet Take 400 mg by mouth 2 (two) times daily.    . metoprolol succinate (TOPROL-XL) 100 MG 24 hr tablet Take 1 tablet (100 mg total) by mouth daily. Take with or immediately following a meal. 30 tablet 6  . mycophenolate (MYFORTIC) 180 MG EC tablet Take 360 mg by mouth 2 (two) times daily.     . nitroGLYCERIN (NITROSTAT) 0.4 MG SL tablet Place 0.4 mg under the tongue every 5 (five) minutes as needed for chest pain.    . prednisoLONE acetate (PRED FORTE) 1 % ophthalmic suspension Place 1 drop into the left eye 4 (four) times daily as needed (itching).    . predniSONE (DELTASONE) 5 MG tablet Take 5 mg by mouth daily.      . sodium bicarbonate 650 MG tablet Take 650 mg by mouth 2 (two) times daily.    Marland Kitchen sulfamethoxazole-trimethoprim (BACTRIM,SEPTRA) 400-80 MG per tablet Take 1 tablet by mouth every Monday, Wednesday, and Friday.     . tacrolimus (PROGRAF) 1 MG capsule Take 2-3 mg by mouth See admin instructions. Take 3 capsules (3 mg) by mouth every morning and 2.5 capsules (2.5 mg) at night    . oxyCODONE-acetaminophen (PERCOCET) 7.5-325 MG tablet Take one or two tablet every 4-6 hours PRN severe pain. (Patient not taking: Reported on 01/02/2018) 28 tablet 0   No current facility-administered medications for this visit.     Allergies:   Iodinated diagnostic agents; Iron; Promethazine; and Wellbutrin [bupropion hcl]    ROS:  Please see the history of present illness.   Otherwise, review of systems are positive for none.   All other systems are reviewed and negative.    PHYSICAL EXAM: VS:  BP 136/78   Pulse (!) 54   Ht 5' 4.5" (1.638 m)   Wt 230 lb (104.3 kg)   SpO2 97%   BMI 38.87 kg/m  , BMI Body mass index is 38.87 kg/m.  GENERAL:  Well appearing NECK:  No jugular venous distention, waveform within normal limits, carotid upstroke brisk and symmetric, no bruits, no thyromegaly LUNGS:  Clear to auscultation  bilaterally CHEST:  Well healed sternotomy scar.  HEART:  PMI not displaced or sustained,S1 and S2 within normal limits, no S3, no S4, no clicks, no rubs, 2 out of 6 apical brief systolic murmur nonradiating, no diastolic murmurs ABD:  Flat, positive bowel sounds normal in frequency in pitch, no bruits, no rebound, no guarding, no midline pulsatile mass, no hepatomegaly, no splenomegaly EXT:  2 plus pulses throughout, trace edema, no cyanosis no clubbing    EKG:  EKG is not  ordered today.    Recent Labs: 09/03/2017: B Natriuretic Peptide 919.0 09/04/2017: ALT 56; Hemoglobin 9.9; Platelets 149 10/07/2017: BUN 46; Creatinine, Ser 2.90; Potassium 4.7; Sodium 145    Lipid Panel    Component Value Date/Time   CHOL 149 04/08/2016 0345   TRIG 71 04/08/2016 0345   HDL 61 04/08/2016 0345   CHOLHDL 2.4 04/08/2016 0345   VLDL 14 04/08/2016 0345   LDLCALC 74 04/08/2016 0345     Lab Results  Component Value Date   HGBA1C 8.5 (H) 09/03/2017    Wt Readings from Last 3 Encounters:  01/02/18 230 lb (104.3 kg)  10/07/17 228 lb (103.4 kg)  09/05/17 230 lb 3.2 oz (104.4 kg)      Other studies Reviewed: Additional studies/ records that were reviewed today include: Labs Review of the above records demonstrates:   ASSESSMENT AND PLAN:  CABG x 3 -Nov 2017 The patient has no new sypmtoms.  No further cardiovascular testing is indicated.  We will continue with aggressive risk reduction and meds as listed.  She has some mild atypical chest pain.  No further testing or change in therapy is indicated.   Status post renal transplant This is followed at [facility]  Diabetes mellitus type 2 in obese ([place]) Last A1C was 8.5 but most recently is 7.3.  This is followed by an endocrineologist  .  I will defer management.   Dyslipidemia I don't see a recent lipid.   Last year LDL was 65.  She thinks there might be more recent labs and she will look for these.  For now she will remain on the meds as  listed.   Essential hypertension BP is controlled.  No change in therapy.   Current medicines are reviewed at length with the patient today.  The patient does not have concerns regarding medicines.  The following changes have been made:  None  Labs/ tests ordered today include: No  No orders of the defined types were placed in this encounter.    Disposition:   FU with me in 12 months.       Signed, Minus Breeding, MD  01/02/2018 11:34 AM    [facility]

## 2018-01-02 ENCOUNTER — Encounter: Payer: Self-pay | Admitting: Cardiology

## 2018-01-02 ENCOUNTER — Ambulatory Visit: Payer: Medicaid Other | Admitting: Cardiology

## 2018-01-02 VITALS — BP 136/78 | HR 54 | Ht 64.5 in | Wt 230.0 lb

## 2018-01-02 DIAGNOSIS — E785 Hyperlipidemia, unspecified: Secondary | ICD-10-CM

## 2018-01-02 DIAGNOSIS — E118 Type 2 diabetes mellitus with unspecified complications: Secondary | ICD-10-CM | POA: Diagnosis not present

## 2018-01-02 DIAGNOSIS — Z794 Long term (current) use of insulin: Secondary | ICD-10-CM | POA: Diagnosis not present

## 2018-01-02 DIAGNOSIS — I25118 Atherosclerotic heart disease of native coronary artery with other forms of angina pectoris: Secondary | ICD-10-CM

## 2018-01-02 NOTE — Patient Instructions (Signed)
Medication Instructions:  Continue current medications  If you need a refill on your cardiac medications before your next appointment, please call your pharmacy.  Labwork: None Ordered   Testing/Procedures: None Ordered  Follow-Up: Your physician wants you to follow-up in: 1 Year. You should receive a reminder letter in the mail two months in advance. If you do not receive a letter, please call our office [phone].    Thank you for choosing [facility]!!      

## 2018-01-22 ENCOUNTER — Other Ambulatory Visit: Payer: Self-pay | Admitting: Cardiology

## 2018-08-27 ENCOUNTER — Other Ambulatory Visit: Payer: Self-pay | Admitting: Physician Assistant

## 2018-11-26 ENCOUNTER — Other Ambulatory Visit: Payer: Self-pay | Admitting: Physician Assistant

## 2019-01-05 ENCOUNTER — Other Ambulatory Visit: Payer: Self-pay | Admitting: Cardiology

## 2019-01-26 ENCOUNTER — Other Ambulatory Visit: Payer: Self-pay | Admitting: Physician Assistant

## 2019-02-06 NOTE — Progress Notes (Signed)
Virtual Visit via Telephone Note   This visit type was conducted due to national recommendations for restrictions regarding the COVID-19 Pandemic (e.g. social distancing) in an effort to limit this patient's exposure and mitigate transmission in our community.  Due to her co-morbid illnesses, this patient is at least at moderate risk for complications without adequate follow up.  This format is felt to be most appropriate for this patient at this time.  The patient did not have access to video technology/had technical difficulties with video requiring transitioning to audio format only (telephone).  All issues noted in this document were discussed and addressed.  No physical exam could be performed with this format.  Please refer to the patient's chart for her  consent to telehealth for [facility].   Date:  02/06/2019   ID:  April Pena, DOB Nov 26, 1955, MRN [id number]  Patient Location: Home Provider Location: Home  PCP:  Guadlupe Spanish, MD  Cardiologist:  Minus Breeding, MD  Electrophysiologist:  None   Evaluation Performed:  Follow-Up Visit  Chief Complaint:  Follow Up  History of Present Illness:    April Pena is a 63 y.o. female we are following for ongoing assessment and management of CAD.  She has a history of non-STEMI and 03/2016, CABG x3 with SVG to OM1-OM 2, and SVG to PDA on 04/12/2016.  She has had a subsequent admission in March 2019 for recurrent chest pain that was found to be atypical and had ruled out for MI.  Lexiscan was recommended but she refused.  Other history includes renal transplant, diabetes type 2, dyslipidemia, and hypertension.  She was last seen by Dr. Percival Spanish on 01/02/2018, at which time she complained of chest discomfort under her left breast which occurs rarely.  She occasionally would take a nitroglycerin if she felt the pain was bad enough.  Otherwise she was doing well.  No further ischemic testing was planned.    She denies chest pain.  Does get out with minimal exertion walking from room to room. She sits down and recovers quickly. She refuses any new testing.  She denies any chest pain, dizziness, medication intolerances.  She is not very active.  She states that she is staying safe and only goes to physician appointments and to the grocery store.  She states her blood pressures in the past been running around 130/70 but she does not have a blood pressure cuff at home.  The patient does not have symptoms concerning for COVID-19 infection (fever, chills, cough, or new shortness of breath).    Past Medical History:  Diagnosis Date  . Blind left eye   . CHF (congestive heart failure) ([place])   . Diabetes mellitus   . Diabetes mellitus type 2 in obese ([place]) 04/11/2016  . Glaucoma   . Hyperlipidemia 04/11/2016  . Hypertension   . Hypertensive heart disease 04/11/2016  . Kidney failure    s/p renal transplant  . MI (myocardial infarction) ([place])   . Unstable angina [facility])    Past Surgical History:  Procedure Laterality Date  . ABDOMINAL HYSTERECTOMY    . CARDIAC CATHETERIZATION N/A 04/08/2016   Procedure: Left Heart Cath and Coronary Angiography;  Surgeon: Burnell Blanks, MD;  Location: [place] CV LAB;  Service: Cardiovascular;  Laterality: N/A;  . CATARACT EXTRACTION    . CHOLECYSTECTOMY    . CORONARY ARTERY BYPASS GRAFT N/A 04/12/2016   Procedure: CORONARY ARTERY BYPASS GRAFTING (CABG) times three using left saphenous vein harvested endoscopically;  Surgeon: Gaye Pollack, MD;  Location: [facility];  Service: Open Heart Surgery;  Laterality: N/A;  SEQ SVG-OM1-OM2 SVG-PD  . EYE SURGERY     cataract  . KIDNEY TRANSPLANT  2011  . TEE WITHOUT CARDIOVERSION N/A 04/12/2016   Procedure: TRANSESOPHAGEAL ECHOCARDIOGRAM (TEE);  Surgeon: Gaye Pollack, MD;  Location: [place];  Service: Open Heart Surgery;  Laterality: N/A;  . TONSILLECTOMY    . TRIGGER FINGER RELEASE       No outpatient medications have been marked as taking  for the 02/08/19 encounter (Appointment) with Lendon Colonel, NP.     Allergies:   Iodinated diagnostic agents, Iron, Promethazine, and Wellbutrin [bupropion hcl]   Social History   Tobacco Use  . Smoking status: Former Research scientist (life sciences)  . Smokeless tobacco: Never Used  . Tobacco comment: Years ago  Substance Use Topics  . Alcohol use: No  . Drug use: No     Family Hx: The patient's family history includes Diabetes in her brother, daughter, father, mother, and paternal grandmother; Heart attack in her mother; Hypertension in her brother, daughter, mother, and sister. There is no history of Hyperlipidemia or Sudden death.  ROS:   Please see the history of present illness.    All other systems reviewed and are negative.   Prior CV studies:   The following studies were reviewed today: Echocardiogram 09/04/2017 Left ventricle: The cavity size was normal. Wall thickness was   increased in a pattern of moderate LVH. Systolic function was   normal. The estimated ejection fraction was in the range of 55%   to 60%. Wall motion was normal; there were no regional wall   motion abnormalities. Features are consistent with a pseudonormal   left ventricular filling pattern, with concomitant abnormal   relaxation and increased filling pressure (grade 2 diastolic   dysfunction). - Aortic valve: Mildly calcified annulus. Trileaflet; normal   thickness, mildly calcified leaflets. Valve area (Vmax): 1.88   cm^2. - Mitral valve: Mildly fibrotic annulus. There was mild   regurgitation. - Pulmonary arteries: Systolic pressure was mildly increased. PA   peak pressure: 35 mm Hg (S).  Labs/Other Tests and Data Reviewed:    EKG:  No ECG reviewed.  Recent Labs: No results found for requested labs within last 8760 hours.   Recent Lipid Panel Lab Results  Component Value Date/Time   CHOL 149 04/08/2016 03:45 AM   TRIG 71 04/08/2016 03:45 AM   HDL 61 04/08/2016 03:45 AM   CHOLHDL 2.4 04/08/2016  03:45 AM   LDLCALC 74 04/08/2016 03:45 AM    Wt Readings from Last 3 Encounters:  01/02/18 230 lb (104.3 kg)  10/07/17 228 lb (103.4 kg)  09/05/17 230 lb 3.2 oz (104.4 kg)     Objective:    Vital Signs:  There were no vitals taken for this visit.   VITAL SIGNS:  reviewed GEN:  no acute distress RESPIRATORY:  normal respiratory effort, symmetric expansion NEURO:  alert and oriented x 3, no obvious focal deficit PSYCH:  normal affect  ASSESSMENT & PLAN:    1. Coronary artery disease: She denies any symptoms of angina but does have dyspnea on exertion walking from one room to another.  Sometimes she has to sit down.  She is not interested in any further testing or medication changes.  She states "it is what it is I know I have heart disease."  I have offered a repeat of her echocardiogram and/or Lexiscan Myoview as recommended by Dr.  Hochrein on last office visit but she refuses.  For now continue current medication regimen.  She will call us if she has severe symptoms.  2.  Hypertension: I will have a blood pressure cuff sent to her home via our social worker to allow her to continue to monitor her blood pressure at home.  She is given parameters of blood pressures greater than 150/95 or less and 110/50, to call us for further instructions.  She verbalizes understanding.  She is to avoid salt which she is already doing.  3.  Hypercholesterolemia: Goal of LDL less than 70 with known CAD.  Most recent lipids were documented in October 2017 with an LDL of 74.  She states she is seen her PCP within the last 6 months who is monitoring her labs and medication adjustments.  COVID-19 Education: The signs and symptoms of COVID-19 were discussed with the patient and how to seek care for testing (follow up with PCP or arrange E-visit).  The importance of social distancing was discussed today.  Time:   Today, I have spent 10 minutes with the patient with telehealth technology discussing the above  problems.     Medication Adjustments/Labs and Tests Ordered: Current medicines are reviewed at length with the patient today.  Concerns regarding medicines are outlined above.   Tests Ordered: No orders of the defined types were placed in this encounter.   Medication Changes: No orders of the defined types were placed in this encounter.   Disposition:  Follow up 6 months with Dr. Percival Spanish   Signed, Phill Myron. [place], ANP, AACC  02/06/2019 4:02 PM    [facility]

## 2019-02-08 ENCOUNTER — Telehealth (INDEPENDENT_AMBULATORY_CARE_PROVIDER_SITE_OTHER): Payer: Medicaid Other | Admitting: Adult Health

## 2019-02-08 ENCOUNTER — Encounter: Payer: Self-pay | Admitting: Adult Health

## 2019-02-08 ENCOUNTER — Telehealth: Payer: Self-pay | Admitting: Licensed Clinical Social Worker

## 2019-02-08 DIAGNOSIS — I2510 Atherosclerotic heart disease of native coronary artery without angina pectoris: Secondary | ICD-10-CM

## 2019-02-08 DIAGNOSIS — E785 Hyperlipidemia, unspecified: Secondary | ICD-10-CM | POA: Diagnosis not present

## 2019-02-08 DIAGNOSIS — I10 Essential (primary) hypertension: Secondary | ICD-10-CM

## 2019-02-08 NOTE — Telephone Encounter (Signed)
CSW referred to assist patient with obtaining a BP cuff. CSW contacted patient to inform cuff will be delivered to home. Message left. CSW available as needed. Jackie Dalphine Cowie, LCSW, CCSW-MCS [phone]  

## 2019-02-08 NOTE — Patient Instructions (Signed)
Medication Instructions:  Continue current medications  If you need a refill on your cardiac medications before your next appointment, please call your pharmacy.  Labwork: None Ordered   Testing/Procedures: None Ordered  Follow-Up: You will need a follow up appointment in 6 months.  Please call our office 2 months in advance to schedule this appointment.  You may see April Hochrein, MD or one of the following Advanced Practice Providers on your designated Care Team:   Rhonda Barrett, PA-C . Kathryn Lawrence, DNP, ANP    At [facility], you and your health needs are our priority.  As part of our continuing mission to provide you with exceptional heart care, we have created designated Provider Care Teams.  These Care Teams include your primary Cardiologist (physician) and Advanced Practice Providers (APPs -  Physician Assistants and Nurse Practitioners) who all work together to provide you with the care you need, when you need it.  Thank you for choosing [facility] at [facility]!!     

## 2019-02-09 ENCOUNTER — Telehealth: Payer: Self-pay | Admitting: Licensed Clinical Social Worker

## 2019-02-09 NOTE — Telephone Encounter (Signed)
Patient returned call to confirm address for BP cuff delivery. Patient grateful for the assistance. Raquel Sarna, [place], [place]

## 2019-03-05 ENCOUNTER — Other Ambulatory Visit: Payer: Self-pay | Admitting: Cardiology

## 2019-04-02 ENCOUNTER — Other Ambulatory Visit: Payer: Self-pay | Admitting: Cardiology

## 2019-04-12 ENCOUNTER — Other Ambulatory Visit: Payer: Self-pay | Admitting: Cardiology

## 2019-04-27 ENCOUNTER — Telehealth: Payer: Self-pay | Admitting: Cardiology

## 2019-04-27 DIAGNOSIS — Z951 Presence of aortocoronary bypass graft: Secondary | ICD-10-CM

## 2019-04-27 MED ORDER — ISOSORBIDE MONONITRATE ER 60 MG PO TB24: ORAL_TABLET | ORAL | 3 refills | Status: DC

## 2019-04-27 MED ORDER — ISOSORBIDE MONONITRATE ER 30 MG PO TB24: ORAL_TABLET | ORAL | 3 refills | Status: DC

## 2019-04-27 NOTE — Telephone Encounter (Signed)
Follow up:     Patient need her medication right away. Please refill also sent a note to triage.

## 2019-04-27 NOTE — Telephone Encounter (Signed)
New message:    Patient calling she would like for some one to call her. She is having some pains in her chest.. She stopped a medication and would like to know if she could start back on that. Please give patient a call.

## 2019-04-27 NOTE — Telephone Encounter (Signed)
Returned call to patient she stated she is out of Isosorbide.Stated she has not took any in 2 months.After reviewing chart she is suppose to take 30 mg and 60 mg daily, making a total of 90 mg daily.Spoke to pharmacist at [facility] the last time 30 mg was filled 12/31/18 and 60 mg 03/04/19.Refills called in for both.

## 2019-04-28 ENCOUNTER — Other Ambulatory Visit: Payer: Self-pay | Admitting: *Deleted

## 2019-04-28 DIAGNOSIS — Z951 Presence of aortocoronary bypass graft: Secondary | ICD-10-CM

## 2019-04-28 MED ORDER — ISOSORBIDE MONONITRATE ER 30 MG PO TB24: ORAL_TABLET | ORAL | 3 refills | Status: DC

## 2019-05-20 ENCOUNTER — Ambulatory Visit: Payer: Medicaid Other | Admitting: Cardiology

## 2019-08-13 ENCOUNTER — Telehealth: Payer: Self-pay | Admitting: Cardiology

## 2019-08-13 MED ORDER — NITROGLYCERIN 0.4 MG SL SUBL: 0.4000 mg | SUBLINGUAL_TABLET | SUBLINGUAL | 0 refills | Status: AC | PRN

## 2019-08-13 NOTE — Telephone Encounter (Signed)
Sent RX into pharmacy.

## 2019-08-13 NOTE — Telephone Encounter (Signed)
New message   Patient needs a new prescription for nitroGLYCERIN (NITROSTAT) 0.4 MG SL tablet sent to [facility], [place]

## 2019-08-26 ENCOUNTER — Encounter: Payer: Self-pay | Admitting: General Practice

## 2019-09-02 ENCOUNTER — Telehealth: Payer: Self-pay | Admitting: Cardiology

## 2019-09-02 NOTE — Telephone Encounter (Signed)
Spoke with patient. Patient called in to see if she can take her IMDUR in 2 different dosages. She wants to know if she can take 60mg  in the AM and 30mg  in the PM. She reports she has occasional pain in an area of her chest without any other symptoms. She reports when it comes she just sits still and it passes. Patient refused office visit, requesting to make medication changes and continue to monitor the situation. No active chest pain at time of call, patient unable to verbalized the last episode of chest pain. Will route to MD for recommendations.

## 2019-09-02 NOTE — Telephone Encounter (Signed)
Pt c/o of Chest Pain: STAT if CP now or developed within 24 hours  1. Are you having CP right now? No  2. Are you experiencing any other symptoms (ex. SOB, nausea, vomiting, sweating)? SOB  3. How long have you been experiencing CP? 1 month+  4. Is your CP continuous or coming and going? Coming and going  5. Have you taken Nitroglycerin? Yes ?

## 2019-09-05 NOTE — Telephone Encounter (Signed)
OK to take it like this.

## 2019-09-07 NOTE — Telephone Encounter (Signed)
Spoke with patient. Per Dr. Percival Spanish it is okay to split IMDUR dosage into 60mg  in the AM and 30mg  in the PM. Patient verbalized understanding.

## 2019-11-03 ENCOUNTER — Other Ambulatory Visit: Payer: Self-pay

## 2019-11-03 MED ORDER — ATORVASTATIN CALCIUM 20 MG PO TABS: 20.0000 mg | ORAL_TABLET | Freq: Every day | ORAL | 0 refills | Status: DC

## 2019-12-02 DIAGNOSIS — I2510 Atherosclerotic heart disease of native coronary artery without angina pectoris: Secondary | ICD-10-CM | POA: Insufficient documentation

## 2019-12-02 DIAGNOSIS — Z7189 Other specified counseling: Secondary | ICD-10-CM | POA: Insufficient documentation

## 2019-12-02 NOTE — Progress Notes (Addendum)
Cardiology Office Note   Date:  12/03/2019   ID:  April Pena, DOB 1955-09-26, MRN [id number]  PCP:  Guadlupe Spanish, MD  Cardiologist:   Minus Breeding, MD   Chief Complaint  Patient presents with  . NSTEMI      History of Present Illness: April Pena is a 64 y.o. female who presents for  management of CAD.  She has a history of non-STEMI and 03/2016, CABG x3 with SVG to OM1-OM 2, and SVG to PDA on 04/12/2016.  She has had a subsequent admission in March 2019 for recurrent chest pain that was found to be atypical and had ruled out for MI.  Lexiscan was recommended but she refused.  Other history includes renal transplant, diabetes type 2, dyslipidemia, and hypertension.    He was in the hospital recently at [facility].  I looked at the records.  She had chest pain and did have an elevated enzymes going from 511 to a peak of 713.  He refused stress testing.  He did not want cardiac catheterization with a creatinine of 2.55.  She had chest pain the day of in the morning of the presentation.  This was substernal.  It was similar to previous angina not relieved at home with 2 nitroglycerin.  He slowly ease and at home over the last week she is not had any further chest discomfort.  She is not had any new shortness of breath, PND or orthopnea.  She has had no palpitations, presyncope or syncope.  He gets around with a walker.  she was in the emergency room.     Past Medical History:  Diagnosis Date  . Blind left eye   . CHF (congestive heart failure) ([place])   . Diabetes mellitus   . Diabetes mellitus type 2 in obese ([place]) 04/11/2016  . Glaucoma   . Hyperlipidemia 04/11/2016  . Hypertension   . Hypertensive heart disease 04/11/2016  . Kidney failure    s/p renal transplant  . MI (myocardial infarction) ([place])   . Unstable angina [facility])     Past Surgical History:  Procedure Laterality Date  . ABDOMINAL HYSTERECTOMY    . CARDIAC CATHETERIZATION N/A 04/08/2016   Procedure:  Left Heart Cath and Coronary Angiography;  Surgeon: Burnell Blanks, MD;  Location: [place] CV LAB;  Service: Cardiovascular;  Laterality: N/A;  . CATARACT EXTRACTION    . CHOLECYSTECTOMY    . CORONARY ARTERY BYPASS GRAFT N/A 04/12/2016   Procedure: CORONARY ARTERY BYPASS GRAFTING (CABG) times three using left saphenous vein harvested endoscopically;  Surgeon: Gaye Pollack, MD;  Location: [facility];  Service: Open Heart Surgery;  Laterality: N/A;  SEQ SVG-OM1-OM2 SVG-PD  . EYE SURGERY     cataract  . KIDNEY TRANSPLANT  2011  . TEE WITHOUT CARDIOVERSION N/A 04/12/2016   Procedure: TRANSESOPHAGEAL ECHOCARDIOGRAM (TEE);  Surgeon: Gaye Pollack, MD;  Location: [place];  Service: Open Heart Surgery;  Laterality: N/A;  . TONSILLECTOMY    . TRIGGER FINGER RELEASE       Current Outpatient Medications  Medication Sig Dispense Refill  . Accu-Chek FastClix Lancets MISC USE TO CHECK BLOOD SUGARS 5 TIMES DAILY.    Marland Kitchen ACCU-CHEK GUIDE test strip USE TO CHECK BLOOD SUGAR 5 TIMES DAILY.    Marland Kitchen acetaminophen (TYLENOL) 500 MG tablet Take 500-1,000 mg by mouth every 6 (six) hours as needed (pain).     Marland Kitchen amLODipine (NORVASC) 10 MG tablet Take 1 tablet (10 mg total)  by mouth daily. 30 tablet 6  . aspirin EC 81 MG tablet Take 81 mg by mouth daily.    Marland Kitchen atorvastatin (LIPITOR) 20 MG tablet Take 1 tablet (20 mg total) by mouth daily at 6 PM. 60 tablet 0  . B Complex Vitamins (VITAMIN-B COMPLEX) TABS Take 1 tablet by mouth daily.    . carboxymethylcellulose (REFRESH PLUS) 0.5 % SOLN Place 1 drop into both eyes 3 (three) times daily as needed.    . celecoxib (CELEBREX) 200 MG capsule Take by mouth.    . cinacalcet (SENSIPAR) 30 MG tablet Take 30 mg by mouth every Monday, Wednesday, and Friday.    . Continuous Blood Gluc Receiver (DEXCOM G6 RECEIVER) DEVI Use as directed for continuous glucose monitoring.    . Continuous Blood Gluc Sensor (DEXCOM G6 SENSOR) MISC Inject 1 sensor to the skin every 10 days for  continuous glucose monitoring.    . Continuous Blood Gluc Sensor (FREESTYLE LIBRE 2 SENSOR) MISC Inject 1 sensor to the skin every 14 days for continuous glucose monitoring.    . Cyanocobalamin 2000 MCG TBCR Take by mouth.    . dexlansoprazole (DEXILANT) 60 MG capsule Take 60 mg by mouth daily.    . dorzolamide-timolol (COSOPT) 22.3-6.8 MG/ML ophthalmic solution Place 1 drop into both eyes 2 (two) times daily.    . ergocalciferol (VITAMIN D2) 1.25 MG (50000 UT) capsule Take by mouth.    . gabapentin (NEURONTIN) 300 MG capsule Take 1 capsule (300 mg total) by mouth daily.    . insulin aspart (NOVOLOG FLEXPEN) 100 UNIT/ML FlexPen Inject 10-30 Units into the skin 3 (three) times daily with meals. Based on CBG or carb count    . Insulin Detemir (LEVEMIR FLEXTOUCH) 100 UNIT/ML Pen Inject 20 Units into the skin daily. 15 mL 11  . Insulin Pen Needle (EASY TOUCH PEN NEEDLES) 31G X 5 MM MISC USE TO INJECT INSULIN FOUR TIMES A DAY    . isosorbide mononitrate (IMDUR) 120 MG 24 hr tablet Take 1 tablet (120 mg total) by mouth daily. 90 tablet 3  . magnesium oxide (MAG-OX) 400 MG tablet Take 400 mg by mouth 2 (two) times daily.    . metoprolol succinate (TOPROL-XL) 100 MG 24 hr tablet Take 1 tablet (100 mg total) by mouth daily. Take with or immediately following a meal. 30 tablet 6  . mycophenolate (MYFORTIC) 180 MG EC tablet Take 360 mg by mouth 2 (two) times daily.     . nitroGLYCERIN (NITROSTAT) 0.4 MG SL tablet Place 1 tablet (0.4 mg total) under the tongue every 5 (five) minutes as needed for chest pain. 30 tablet 0  . ondansetron (ZOFRAN) 4 MG tablet Take by mouth.    . prednisoLONE acetate (PRED FORTE) 1 % ophthalmic suspension Place 1 drop into the left eye daily.    . predniSONE (DELTASONE) 5 MG tablet Take 5 mg by mouth as needed.     . sodium bicarbonate 650 MG tablet Take 650 mg by mouth 2 (two) times daily.    Marland Kitchen sulfamethoxazole-trimethoprim (BACTRIM,SEPTRA) 400-80 MG per tablet Take 1 tablet by  mouth every Monday, Wednesday, and Friday.     . tacrolimus (PROGRAF) 1 MG capsule Take 2-3 mg by mouth See admin instructions. Take 3 capsules (3 mg) by mouth every morning and 2.5 capsules (2.5 mg) at night    . furosemide (LASIX) 40 MG tablet Take 1 tablet (40 mg total) by mouth daily. 30 tablet 1   No current facility-administered medications  for this visit.    Allergies:   Iodinated diagnostic agents, Iron, Promethazine, and Wellbutrin [bupropion hcl]    ROS:  Please see the history of present illness.   Otherwise, review of systems are positive for none.   All other systems are reviewed and negative.    PHYSICAL EXAM: VS:  BP (!) 150/80   Pulse 76   Temp (!) 96.8 F (36 C)   Ht 5\' 4"  (1.626 m)   Wt 191 lb 12.8 oz (87 kg)   SpO2 94%   BMI 32.92 kg/m  , BMI Body mass index is 32.92 kg/m. GENERAL:  Well appearing NECK:  No jugular venous distention, waveform within normal limits, carotid upstroke brisk and symmetric, no bruits, no thyromegaly LUNGS:  Clear to auscultation bilaterally CHEST:  Well healed sternotomy scar. HEART:  PMI not displaced or sustained,S1 and S2 within normal limits, no S3, no S4, no clicks, no rubs, 3 out of 6 systolic murmur radiating aortic outflow tract, no diastolic murmurs ABD:  Flat, positive bowel sounds normal in frequency in pitch, no bruits, no rebound, no guarding, no midline pulsatile mass, no hepatomegaly, no splenomegaly EXT:  2 plus pulses throughout, no edema, no cyanosis no clubbing   EKG:  EKG is ordered today. The ekg ordered today demonstrates sinus rhythm, rate 76, left axis deviation, premature atrial contractions, nonspecific lateral T wave changes.  New T wave changes are new from previous.   Recent Labs: No results found for requested labs within last 8760 hours.    Lipid Panel    Component Value Date/Time   CHOL 149 04/08/2016 0345   TRIG 71 04/08/2016 0345   HDL 61 04/08/2016 0345   CHOLHDL 2.4 04/08/2016 0345    VLDL 14 04/08/2016 0345   LDLCALC 74 04/08/2016 0345      Wt Readings from Last 3 Encounters:  12/03/19 191 lb 12.8 oz (87 kg)  02/08/19 196 lb (88.9 kg)  01/02/18 230 lb (104.3 kg)      Other studies Reviewed: Additional studies/ records that were reviewed today include: [facility] records. Review of the above records demonstrates:  Please see elsewhere in the note.     ASSESSMENT AND PLAN:  Coronary artery disease:  The patient had a non-STEMI.  However, he has very mild conservative therapy.  She does not want another perfusion study.  She does not want to risk a catheterization with her renal transplant.  She grudgingly consents to some up titration of her Imdur.  I will do this to 120 mg daily.  I will check an echocardiogram to make sure there is no wall motion abnormalities new.  She will let me know if she has any worsening symptoms.   Hypertension:   Blood pressures controlled and will be managed in the context of managing the above.  Hypercholesterolemia: Goal of LDL less than 70.  I will check a fasting lipid profile.  Murmur: She has some aortic sclerosis and some mild tricuspid regurgitation previously.  I will check this Of her echo.  Covid education: She has no intention of getting her vaccine.   Current medicines are reviewed at length with the patient today.  The patient does not have concerns regarding medicines.  The following changes have been made:  no change  Labs/ tests ordered today include:   Orders Placed This Encounter  Procedures  . Lipid panel  . ECHOCARDIOGRAM COMPLETE     Disposition:   FU with me or APP in  3 months.     Signed, Minus Breeding, MD  12/03/2019 11:13 AM    [place]

## 2019-12-03 ENCOUNTER — Encounter: Payer: Self-pay | Admitting: Cardiology

## 2019-12-03 ENCOUNTER — Ambulatory Visit (INDEPENDENT_AMBULATORY_CARE_PROVIDER_SITE_OTHER): Payer: Medicaid Other | Admitting: Cardiology

## 2019-12-03 ENCOUNTER — Other Ambulatory Visit: Payer: Self-pay

## 2019-12-03 VITALS — BP 150/80 | HR 76 | Temp 96.8°F | Ht 64.0 in | Wt 191.8 lb

## 2019-12-03 DIAGNOSIS — I2510 Atherosclerotic heart disease of native coronary artery without angina pectoris: Secondary | ICD-10-CM | POA: Diagnosis not present

## 2019-12-03 DIAGNOSIS — I10 Essential (primary) hypertension: Secondary | ICD-10-CM

## 2019-12-03 DIAGNOSIS — E785 Hyperlipidemia, unspecified: Secondary | ICD-10-CM | POA: Diagnosis not present

## 2019-12-03 DIAGNOSIS — I214 Non-ST elevation (NSTEMI) myocardial infarction: Secondary | ICD-10-CM

## 2019-12-03 DIAGNOSIS — Z7189 Other specified counseling: Secondary | ICD-10-CM | POA: Diagnosis not present

## 2019-12-03 MED ORDER — ISOSORBIDE MONONITRATE ER 120 MG PO TB24: 120.0000 mg | ORAL_TABLET | Freq: Every day | ORAL | 3 refills | Status: DC

## 2019-12-03 NOTE — Patient Instructions (Addendum)
Medication Instructions:  INCREASE YOUR ISOSORBIDE TO 120 MG DAILY   *If you need a refill on your cardiac medications before your next appointment, please call your pharmacy*  Lab Work: FASTING LIPIDS WHEN YOU HAVE YOUR ECHO   Testing/Procedures: Your physician has requested that you have an echocardiogram. Echocardiography is a painless test that uses sound waves to create images of your heart. It provides your doctor with information about the size and shape of your heart and how well your heart's chambers and valves are working. This procedure takes approximately one hour. There are no restrictions for this procedure. [address]   Follow-Up: At [facility], you and your health needs are our priority.  As part of our continuing mission to provide you with exceptional heart care, we have created designated Provider Care Teams.  These Care Teams include your primary Cardiologist (physician) and Advanced Practice Providers (APPs -  Physician Assistants and Nurse Practitioners) who all work together to provide you with the care you need, when you need it.  We recommend signing up for the patient portal called "MyChart".  Sign up information is provided on this After Visit Summary.  MyChart is used to connect with patients for Virtual Visits (Telemedicine).  Patients are able to view lab/test results, encounter notes, upcoming appointments, etc.  Non-urgent messages can be sent to your provider as well.   To learn more about what you can do with MyChart, go to [email or web address].    Your next appointment:   3 month(s)  The format for your next appointment:   In Person  Provider:   You may see Minus Breeding, MD or one of the following Advanced Practice Providers on your designated Care Team:    Jory Sims, DNP, ANP

## 2019-12-14 ENCOUNTER — Telehealth: Payer: Self-pay | Admitting: Cardiology

## 2019-12-14 ENCOUNTER — Other Ambulatory Visit: Payer: Self-pay | Admitting: Cardiology

## 2019-12-14 ENCOUNTER — Other Ambulatory Visit: Payer: Self-pay

## 2019-12-14 MED ORDER — ISOSORBIDE MONONITRATE ER 120 MG PO TB24: 120.0000 mg | ORAL_TABLET | Freq: Every day | ORAL | 3 refills | Status: DC

## 2019-12-14 NOTE — Telephone Encounter (Signed)
Patient is called stating her pharmacy has not received her new script for isosorbide mononitrate (IMDUR) 120 MG 24 hr tablet. I see it was sent on 6/25, to [place] [place] [facility] [place].  Please resend.

## 2019-12-14 NOTE — Telephone Encounter (Signed)
*  STAT* If patient is at the pharmacy, call can be transferred to refill team.   1. Which medications need to be refilled? (please list name of each medication and dose if known)? isosorbide mononitrate (IMDUR) 120 MG 24 hr tablet  2. Which pharmacy/location (including street and city if local pharmacy) is medication to be sent to? [facility] [place], [place]  3. Do they need a 30 day or 90 day supply? 90 day.   [facility] stated that they did not receive the new prescription with the new dosage. [place] that it was confirmed by the pharmacy on 12/03/2019 at 10:56am.

## 2019-12-14 NOTE — Telephone Encounter (Signed)
Medication was resent to pharmacy. 

## 2019-12-17 NOTE — Addendum Note (Signed)
Addended by: Merri Ray A on: 12/17/2019 01:22 PM   Modules accepted: Orders

## 2019-12-19 DIAGNOSIS — R011 Cardiac murmur, unspecified: Secondary | ICD-10-CM | POA: Insufficient documentation

## 2019-12-19 NOTE — Progress Notes (Deleted)
{Choose 1 Note Type (Video or Telephone):[phone]}   The patient was identified using 2 identifiers.  Date:  12/19/2019   ID:  April Pena, DOB 02-11-1956, MRN [id number]  {Patient [phone]::"Home"} {Provider Location:[phone]::"Home Office"}  PCP:  Guadlupe Spanish, MD  Cardiologist:  Minus Breeding, MD *** Electrophysiologist:  None   Evaluation Performed:  {Choose Visit [phone]::"Follow-Up Visit"}  Chief Complaint:  ***  History of Present Illness:    April Pena is a 64 y.o. female who presents for  management of CAD.  She has a history of non-STEMI and 03/2016, CABG x3 with SVG to OM1-OM 2, and SVG to PDA on 04/12/2016.  She has had a subsequent admission in March 2019 for recurrent chest pain that was found to be atypical and had ruled out for MI.  Lexiscan was recommended but she refused.   Other history includes renal transplant, diabetes type 2, dyslipidemia, and hypertension.    He was in the hospital earlier this year at [facility].  She had chest pain and did have an elevated enzymes going from 511 to a peak of 713.  He refused stress testing.    When I saw her recently, per her request, we were managing her medically.  She is scheduled for an echocardiogram.   ***  *** He did not want cardiac catheterization with a creatinine of 2.55.  She had chest pain the day of in the morning of the presentation.  This was substernal.  It was similar to previous angina not relieved at home with 2 nitroglycerin.  He slowly ease and at home over the last week she is not had any further chest discomfort.  She is not had any new shortness of breath, PND or orthopnea.  She has had no palpitations, presyncope or syncope.  He gets around with a walker.  she was in the emergency room.    The patient {does/does not:200015} have symptoms concerning for COVID-19 infection (fever, chills, cough, or new shortness of breath).    Past Medical History:  Diagnosis Date  .  Blind left eye   . CHF (congestive heart failure) ([place])   . Diabetes mellitus   . Diabetes mellitus type 2 in obese ([place]) 04/11/2016  . Glaucoma   . Hyperlipidemia 04/11/2016  . Hypertension   . Hypertensive heart disease 04/11/2016  . Kidney failure    s/p renal transplant  . MI (myocardial infarction) ([place])   . Unstable angina [facility])    Past Surgical History:  Procedure Laterality Date  . ABDOMINAL HYSTERECTOMY    . CARDIAC CATHETERIZATION N/A 04/08/2016   Procedure: Left Heart Cath and Coronary Angiography;  Surgeon: Burnell Blanks, MD;  Location: [place] CV LAB;  Service: Cardiovascular;  Laterality: N/A;  . CATARACT EXTRACTION    . CHOLECYSTECTOMY    . CORONARY ARTERY BYPASS GRAFT N/A 04/12/2016   Procedure: CORONARY ARTERY BYPASS GRAFTING (CABG) times three using left saphenous vein harvested endoscopically;  Surgeon: Gaye Pollack, MD;  Location: [place] OR;  Service: Open Heart Surgery;  Laterality: N/A;  SEQ SVG-OM1-OM2 SVG-PD  . EYE SURGERY     cataract  . KIDNEY TRANSPLANT  2011  . TEE WITHOUT CARDIOVERSION N/A 04/12/2016   Procedure: TRANSESOPHAGEAL ECHOCARDIOGRAM (TEE);  Surgeon: Gaye Pollack, MD;  Location: [place];  Service: Open Heart Surgery;  Laterality: N/A;  . TONSILLECTOMY    . TRIGGER FINGER RELEASE       No outpatient medications have been marked as taking for the 12/20/19  encounter (Appointment) with Minus Breeding, MD.     Allergies:   Iodinated diagnostic agents, Iron, Promethazine, and Wellbutrin [bupropion hcl]   Social History   Tobacco Use  . Smoking status: Former Research scientist (life sciences)  . Smokeless tobacco: Never Used  . Tobacco comment: Years ago  Vaping Use  . Vaping Use: Never used  Substance Use Topics  . Alcohol use: No  . Drug use: No     Family Hx: The patient's family history includes Diabetes in her brother, daughter, father, mother, and paternal grandmother; Heart attack in her mother; Hypertension in her brother, daughter, mother, and  sister. There is no history of Hyperlipidemia or Sudden death.  ROS:   Please see the history of present illness.    *** All other systems reviewed and are negative.   Prior CV studies:   The following studies were reviewed today:  ***  Labs/Other Tests and Data Reviewed:    EKG:  {EKG/Telemetry Strips Reviewed:[phone]}  Recent Labs: No results found for requested labs within last 8760 hours.   Recent Lipid Panel Lab Results  Component Value Date/Time   CHOL 149 04/08/2016 03:45 AM   TRIG 71 04/08/2016 03:45 AM   HDL 61 04/08/2016 03:45 AM   CHOLHDL 2.4 04/08/2016 03:45 AM   LDLCALC 74 04/08/2016 03:45 AM    Wt Readings from Last 3 Encounters:  12/03/19 191 lb 12.8 oz (87 kg)  02/08/19 196 lb (88.9 kg)  01/02/18 230 lb (104.3 kg)     Objective:    Vital Signs:  There were no vitals taken for this visit.   {[facility] Virtual Exam (Optional):[phone]::"VITAL SIGNS:  reviewed"}  ASSESSMENT & PLAN:    Coronary artery disease: The patient had a non-STEMI.  ***  However, he has very mild 20 conservative therapy.  She does not want another perfusion study.  She does not want to risk a catheterization with her renal transplant.  She grudgingly consents to some up titration of her Imdur.  I will do this 220 mg daily.  I will check an echocardiogram to make sure there is no wall motion abnormalities new.  She will let me know if she has any worsening symptoms.   Hypertension:   The blood presure is  ***   Blood pressures controlled and will be managed in the context of managing the above.  Hypercholesterolemia:    LDL was ***  Goal of LDL less than 70.  I will check a fasting lipid profile.  Murmur: She is scheduled for an echocardiogram.  ***  has some aortic sclerosis and some mild tricuspid regurgitation previously.  I will check this   Of her echo.  Covid ecuation.  ***   Time:   Today, I have spent *** minutes with the patient with telehealth technology  discussing the above problems.     Medication Adjustments/Labs and Tests Ordered: Current medicines are reviewed at length with the patient today.  Concerns regarding medicines are outlined above.   Tests Ordered: No orders of the defined types were placed in this encounter.   Medication Changes: No orders of the defined types were placed in this encounter.   Follow Up:  {F/U Format:[id number]} {follow up:15908}  Signed, Minus Breeding, MD  12/19/2019 10:52 AM    [place]

## 2019-12-20 ENCOUNTER — Telehealth: Payer: Medicaid Other | Admitting: Cardiology

## 2019-12-28 ENCOUNTER — Other Ambulatory Visit: Payer: Self-pay

## 2019-12-28 ENCOUNTER — Other Ambulatory Visit: Payer: Medicaid Other

## 2019-12-28 ENCOUNTER — Ambulatory Visit (HOSPITAL_COMMUNITY): Payer: Medicaid Other | Attending: Cardiology

## 2019-12-28 DIAGNOSIS — I214 Non-ST elevation (NSTEMI) myocardial infarction: Secondary | ICD-10-CM | POA: Diagnosis present

## 2019-12-28 DIAGNOSIS — I2510 Atherosclerotic heart disease of native coronary artery without angina pectoris: Secondary | ICD-10-CM | POA: Insufficient documentation

## 2019-12-28 DIAGNOSIS — I10 Essential (primary) hypertension: Secondary | ICD-10-CM

## 2019-12-28 LAB — ECHOCARDIOGRAM COMPLETE
AR max vel: 1.65 cm2
AV Area VTI: 1.76 cm2
AV Area mean vel: 1.71 cm2
AV Mean grad: 12 mmHg
AV Peak grad: 23.2 mmHg
Ao pk vel: 2.41 m/s
Area-P 1/2: 2.69 cm2
S' Lateral: 1.9 cm

## 2020-02-22 ENCOUNTER — Other Ambulatory Visit: Payer: Self-pay | Admitting: Cardiology

## 2020-03-27 ENCOUNTER — Other Ambulatory Visit: Payer: Self-pay | Admitting: Cardiology

## 2020-03-30 ENCOUNTER — Telehealth: Payer: Self-pay | Admitting: Cardiology

## 2020-03-30 NOTE — Telephone Encounter (Signed)
*  STAT* If patient is at the pharmacy, call can be transferred to refill team.   1. Which medications need to be refilled? (please list name of each medication and dose if known) Isosorbide  2. Which pharmacy/location (including street and city if local pharmacy) is medication to be sent to? [facility] [phone]  3. Do they need a 30 day or 90 day supply? If doctor will approve 90 if not 30

## 2020-03-31 MED ORDER — ISOSORBIDE MONONITRATE ER 120 MG PO TB24: 120.0000 mg | ORAL_TABLET | Freq: Every day | ORAL | 3 refills | Status: DC

## 2020-03-31 NOTE — Telephone Encounter (Signed)
Spoke with pt daughter and informed her I refilled the medication to the pharmacy she requested.

## 2020-04-03 ENCOUNTER — Other Ambulatory Visit: Payer: Self-pay | Admitting: Cardiology

## 2020-08-21 ENCOUNTER — Encounter: Payer: Self-pay | Admitting: General Practice

## 2020-10-20 ENCOUNTER — Telehealth: Payer: Self-pay | Admitting: Cardiology

## 2020-10-20 NOTE — Telephone Encounter (Addendum)
Spoke to pt and daughter who report pt was experiencing off and on chest pain with exertion but last episode was the beginning of April. However daughter state pt currently lives with her and has a room on the second floor. She state pt was told by someone that she shouldn't walk up and down the stairs but once a month. She state pt is scared she will have another MI and wanting to discuss with Dr. Warren Lacy.  Appointment scheduled for 5/16 for further evaluations.   Pt made aware of ED precaution should any new symptoms develop or worsen.

## 2020-10-20 NOTE — Telephone Encounter (Signed)
Pt c/o of Chest Pain: STAT if CP now or developed within 24 hours  1. Are you having CP right now? no  2. Are you experiencing any other symptoms (ex. SOB, nausea, vomiting, sweating)? Does get SOB not currently, only when moving, does sweat but is not sure if it is because she is diabetic   3. How long have you been experiencing CP? Couple months  4. Is your CP continuous or coming and going? Comes and goes  5. Have you taken Nitroglycerin? Took it once   Patient states she has been getting chest pain for the last couple months. She states she is not having chest pain now. She states she does also get SOB when moving around, but does not currently have SOB. She states she also does sweat, but is not sure if that is because she is diabetic. She states when she gets the chest pain she usually sits and rests until she feels like moving again. She states she is staying at her daughter's upstairs and her daughter brings her food. She would like to know if it would be harmful for her to go up and down 2 flights of stairs. She would like a call back at her daughter's number: [phone]  ?

## 2020-10-21 ENCOUNTER — Encounter: Payer: Self-pay | Admitting: Cardiology

## 2020-10-21 DIAGNOSIS — I272 Pulmonary hypertension, unspecified: Secondary | ICD-10-CM | POA: Insufficient documentation

## 2020-10-21 DIAGNOSIS — I361 Nonrheumatic tricuspid (valve) insufficiency: Secondary | ICD-10-CM | POA: Insufficient documentation

## 2020-10-21 NOTE — Progress Notes (Deleted)
Cardiology Office Note   Date:  10/21/2020   ID:  April Pena, DOB [birth year], MRN [id number]  PCP:  April Spanish, MD  Cardiologist:   Minus Breeding, MD   No chief complaint on file.     History of Present Illness: April Pena is a 65 y.o. female who presents for  management of CAD.  She has a history of non-STEMI and 03/2016, CABG x3 with SVG to OM1-OM 2, and SVG to PDA on 04/12/2016.  She has had a subsequent admission in March 2019 for recurrent chest pain that was found to be atypical and had ruled out for MI.  Lexiscan was recommended but she refused.  Other history includes renal transplant, diabetes type 2, dyslipidemia, and hypertension.    Echo in Feb of this year demonstrated a normal EF with moderate LVH, moderate TR and moderately elevated pulmonary hypertension.  ***  *** He was in the hospital recently at [facility].  I looked at the records.  She had chest pain and did have an elevated enzymes going from 511 to a peak of 713.  He refused stress testing.  He did not want cardiac catheterization with a creatinine of 2.55.  She had chest pain the day of in the morning of the presentation.  This was substernal.  It was similar to previous angina not relieved at home with 2 nitroglycerin.  He slowly ease and at home over the last week she is not had any further chest discomfort.  She is not had any new shortness of breath, PND or orthopnea.  She has had no palpitations, presyncope or syncope.  He gets around with a walker.  she was in the emergency room.     Past Medical History:  Diagnosis Date  . Blind left eye   . CHF (congestive heart failure) ([name]   . Diabetes mellitus   . Diabetes mellitus type 2 in obese ([place]) 04/11/2016  . Glaucoma   . Hyperlipidemia 04/11/2016  . Hypertension   . Hypertensive heart disease 04/11/2016  . Kidney failure    s/p renal transplant  . MI (myocardial infarction) ([place])   . Unstable angina [facility])     Past Surgical History:   Procedure Laterality Date  . ABDOMINAL HYSTERECTOMY    . CARDIAC CATHETERIZATION N/A 04/08/2016   Procedure: Left Heart Cath and Coronary Angiography;  Surgeon: Burnell Blanks, MD;  Location: [place] CV LAB;  Service: Cardiovascular;  Laterality: N/A;  . CATARACT EXTRACTION    . CHOLECYSTECTOMY    . CORONARY ARTERY BYPASS GRAFT N/A 04/12/2016   Procedure: CORONARY ARTERY BYPASS GRAFTING (CABG) times three using left saphenous vein harvested endoscopically;  Surgeon: Gaye Pollack, MD;  Location: [place] OR;  Service: Open Heart Surgery;  Laterality: N/A;  SEQ SVG-OM1-OM2 SVG-PD  . EYE SURGERY     cataract  . KIDNEY TRANSPLANT  2011  . TEE WITHOUT CARDIOVERSION N/A 04/12/2016   Procedure: TRANSESOPHAGEAL ECHOCARDIOGRAM (TEE);  Surgeon: Gaye Pollack, MD;  Location: [place];  Service: Open Heart Surgery;  Laterality: N/A;  . TONSILLECTOMY    . TRIGGER FINGER RELEASE       Current Outpatient Medications  Medication Sig Dispense Refill  . Accu-Chek FastClix Lancets MISC USE TO CHECK BLOOD SUGARS 5 TIMES DAILY.    Marland Kitchen ACCU-CHEK GUIDE test strip USE TO CHECK BLOOD SUGAR 5 TIMES DAILY.    Marland Kitchen acetaminophen (TYLENOL) 500 MG tablet Take 500-1,000 mg by mouth every 6 (six) hours  as needed (pain).     Marland Kitchen amLODipine (NORVASC) 10 MG tablet Take 1 tablet (10 mg total) by mouth daily. 30 tablet 6  . aspirin EC 81 MG tablet Take 81 mg by mouth daily.    Marland Kitchen atorvastatin (LIPITOR) 20 MG tablet TAKE 1 TABLET BY MOUTH DAILY 28 tablet 8  . B Complex Vitamins (VITAMIN-B COMPLEX) TABS Take 1 tablet by mouth daily.    . carboxymethylcellulose (REFRESH PLUS) 0.5 % SOLN Place 1 drop into both eyes 3 (three) times daily as needed.    . celecoxib (CELEBREX) 200 MG capsule Take by mouth.    . cinacalcet (SENSIPAR) 30 MG tablet Take 30 mg by mouth every Monday, Wednesday, and Friday.    . Continuous Blood Gluc Receiver (DEXCOM G6 RECEIVER) DEVI Use as directed for continuous glucose monitoring.    . Continuous  Blood Gluc Sensor (DEXCOM G6 SENSOR) MISC Inject 1 sensor to the skin every 10 days for continuous glucose monitoring.    . Continuous Blood Gluc Sensor (FREESTYLE LIBRE 2 SENSOR) MISC Inject 1 sensor to the skin every 14 days for continuous glucose monitoring.    . Cyanocobalamin 2000 MCG TBCR Take by mouth.    . dexlansoprazole (DEXILANT) 60 MG capsule Take 60 mg by mouth daily.    . dorzolamide-timolol (COSOPT) 22.3-6.8 MG/ML ophthalmic solution Place 1 drop into both eyes 2 (two) times daily.    . ergocalciferol (VITAMIN D2) 1.25 MG (50000 UT) capsule Take by mouth.    . furosemide (LASIX) 40 MG tablet Take 1 tablet (40 mg total) by mouth daily. 30 tablet 1  . gabapentin (NEURONTIN) 300 MG capsule Take 1 capsule (300 mg total) by mouth daily.    . insulin aspart (NOVOLOG FLEXPEN) 100 UNIT/ML FlexPen Inject 10-30 Units into the skin 3 (three) times daily with meals. Based on CBG or carb count    . Insulin Detemir (LEVEMIR FLEXTOUCH) 100 UNIT/ML Pen Inject 20 Units into the skin daily. 15 mL 11  . Insulin Pen Needle (EASY TOUCH PEN NEEDLES) 31G X 5 MM MISC USE TO INJECT INSULIN FOUR TIMES A DAY    . isosorbide mononitrate (IMDUR) 120 MG 24 hr tablet Take 1 tablet (120 mg total) by mouth daily. 90 tablet 3  . magnesium oxide (MAG-OX) 400 MG tablet Take 400 mg by mouth 2 (two) times daily.    . metoprolol succinate (TOPROL-XL) 100 MG 24 hr tablet Take 1 tablet (100 mg total) by mouth daily. Take with or immediately following a meal. 30 tablet 6  . mycophenolate (MYFORTIC) 180 MG EC tablet Take 360 mg by mouth 2 (two) times daily.     . nitroGLYCERIN (NITROSTAT) 0.4 MG SL tablet Place 1 tablet (0.4 mg total) under the tongue every 5 (five) minutes as needed for chest pain. 30 tablet 0  . ondansetron (ZOFRAN) 4 MG tablet Take by mouth.    . prednisoLONE acetate (PRED FORTE) 1 % ophthalmic suspension Place 1 drop into the left eye daily.    . predniSONE (DELTASONE) 5 MG tablet Take 5 mg by mouth as  needed.     . sodium bicarbonate 650 MG tablet Take 650 mg by mouth 2 (two) times daily.    Marland Kitchen sulfamethoxazole-trimethoprim (BACTRIM,SEPTRA) 400-80 MG per tablet Take 1 tablet by mouth every Monday, Wednesday, and Friday.     . tacrolimus (PROGRAF) 1 MG capsule Take 2-3 mg by mouth See admin instructions. Take 3 capsules (3 mg) by mouth every morning and 2.5  capsules (2.5 mg) at night     No current facility-administered medications for this visit.    Allergies:   Iodinated diagnostic agents, Iron, Promethazine, and Wellbutrin [bupropion hcl]    ROS:  Please see the history of present illness.   Otherwise, review of systems are positive for ***.   All other systems are reviewed and negative.    PHYSICAL EXAM: VS:  There were no vitals taken for this visit. , BMI There is no height or weight on file to calculate BMI. GENERAL:  Well appearing NECK:  No jugular venous distention, waveform within normal limits, carotid upstroke brisk and symmetric, no bruits, no thyromegaly LUNGS:  Clear to auscultation bilaterally CHEST:  Unremarkable HEART:  PMI not displaced or sustained,S1 and S2 within normal limits, no S3, no S4, no clicks, no rubs, *** murmurs ABD:  Flat, positive bowel sounds normal in frequency in pitch, no bruits, no rebound, no guarding, no midline pulsatile mass, no hepatomegaly, no splenomegaly EXT:  2 plus pulses throughout, no edema, no cyanosis no clubbing    ***GENERAL:  Well appearing NECK:  No jugular venous distention, waveform within normal limits, carotid upstroke brisk and symmetric, no bruits, no thyromegaly LUNGS:  Clear to auscultation bilaterally CHEST:  Well healed sternotomy scar. HEART:  PMI not displaced or sustained,S1 and S2 within normal limits, no S3, no S4, no clicks, no rubs, 3 out of 6 systolic murmur radiating aortic outflow tract, no diastolic murmurs ABD:  Flat, positive bowel sounds normal in frequency in pitch, no bruits, no rebound, no guarding,  no midline pulsatile mass, no hepatomegaly, no splenomegaly EXT:  2 plus pulses throughout, no edema, no cyanosis no clubbing   EKG:  EKG is *** ordered today. The ekg ordered today demonstrates sinus rhythm, rate ***, left axis deviation, premature atrial contractions, nonspecific lateral T wave changes.  New T wave changes are new from previous.   Recent Labs: No results found for requested labs within last 8760 hours.    Lipid Panel    Component Value Date/Time   CHOL 149 04/08/2016 0345   TRIG 71 04/08/2016 0345   HDL 61 04/08/2016 0345   CHOLHDL 2.4 04/08/2016 0345   VLDL 14 04/08/2016 0345   LDLCALC 74 04/08/2016 0345      Wt Readings from Last 3 Encounters:  12/03/19 191 lb 12.8 oz (87 kg)  02/08/19 196 lb (88.9 kg)  01/02/18 230 lb (104.3 kg)      Other studies Reviewed: Additional studies/ records that were reviewed today include: *** Review of the above records demonstrates:  Please see elsewhere in the note.     ASSESSMENT AND PLAN:  Coronary artery disease:   ***  The patient had a non-STEMI.  However, he has very mild conservative therapy.  She does not want another perfusion study.  She does not want to risk a catheterization with her renal transplant.  She grudgingly consents to some up titration of her Imdur.  I will do this to 120 mg daily.  I will check an echocardiogram to make sure there is no wall motion abnormalities new.  She will let me know if she has any worsening symptoms.   Hypertension:   The blood pressure is *** Blood pressures controlled and will be managed in the context of managing the above.  Hypercholesterolemia: Goal of LDL less than 70. *** I will check a fasting lipid profile.  TR:   ***  She has some aortic sclerosis and some  mild tricuspid regurgitation previously.  I will check this Of her echo.  Pulmonary HTN:  ***    Current medicines are reviewed at length with the patient today.  The patient does not have concerns  regarding medicines.  The following changes have been made:  no change  Labs/ tests ordered today include:   No orders of the defined types were placed in this encounter.    Disposition:   FU with me or APP in 3 months.     Signed, Minus Breeding, MD  10/21/2020 9:50 AM    [place]

## 2020-10-23 ENCOUNTER — Ambulatory Visit: Payer: Medicaid Other | Admitting: Cardiology

## 2020-10-23 DIAGNOSIS — I10 Essential (primary) hypertension: Secondary | ICD-10-CM

## 2020-10-23 DIAGNOSIS — E785 Hyperlipidemia, unspecified: Secondary | ICD-10-CM

## 2020-10-23 DIAGNOSIS — I2510 Atherosclerotic heart disease of native coronary artery without angina pectoris: Secondary | ICD-10-CM

## 2020-10-23 DIAGNOSIS — I272 Pulmonary hypertension, unspecified: Secondary | ICD-10-CM

## 2020-10-23 DIAGNOSIS — I361 Nonrheumatic tricuspid (valve) insufficiency: Secondary | ICD-10-CM

## 2021-02-16 ENCOUNTER — Other Ambulatory Visit: Payer: Self-pay | Admitting: Cardiology

## 2021-03-15 ENCOUNTER — Other Ambulatory Visit: Payer: Self-pay | Admitting: Cardiology

## 2021-03-30 ENCOUNTER — Other Ambulatory Visit: Payer: Self-pay | Admitting: Cardiology

## 2021-04-11 ENCOUNTER — Encounter: Payer: Self-pay | Admitting: Cardiology

## 2021-04-11 NOTE — Telephone Encounter (Signed)
error 

## 2021-04-16 ENCOUNTER — Telehealth: Payer: Self-pay | Admitting: Cardiology

## 2021-04-16 NOTE — Telephone Encounter (Signed)
Returned call to Pt's daughter she states that "all of the pt's Dr's had different medication lists" and she has not been taking metoprolol "for months" she is wondering if pt's she continue taking Metoprolol Succ 100mg ? She also states that she tried to get a refill but this was never refilled and "it just slipped her mind" she is due for follow up appointment. She apologizes for cancelling the last appointment, she had COVID and forgot to call back and make an appt.  Informed her that pt should be taking, I will send message to verify with Dr Percival Spanish. She will await call back.

## 2021-04-16 NOTE — Telephone Encounter (Signed)
Pt c/o medication issue:  1. Name of Medication:   Plavix  metoprolol succinate (TOPROL-XL) 100 MG 24 hr tablet  2. How are you currently taking this medication (dosage and times per day)? Need to verify if patient should be taking  3. Are you having a reaction (difficulty breathing--STAT)? no  4. What is your medication issue? Patient's daughter calling to find out whether the patient needs to be put back on the medications or stay off of it. Phone: [phone]

## 2021-04-17 MED ORDER — METOPROLOL SUCCINATE ER 100 MG PO TB24: 100.0000 mg | ORAL_TABLET | Freq: Every day | ORAL | 1 refills | Status: DC

## 2021-04-17 NOTE — Telephone Encounter (Signed)
Minus Breeding, MD  You 04-16-22 at 6:06 PM I do have a risk that she is taking Toprol-XL 100 mg p.o. daily and we can call in a 51-month prescription for this.  I would like her to get back in the office.  It's over a year.  This can be with an APP.   Left detailed message for Edwyna Perfect (daughter) re: Metoprolol rx. Refill sent. #30 w/1 refill to make it to follow up appointment.  Pt already has follow up appointment scheduled with Dr Percival Spanish in December, she can discuss this then.

## 2021-04-27 ENCOUNTER — Telehealth: Payer: Self-pay | Admitting: Cardiology

## 2021-04-27 NOTE — Telephone Encounter (Signed)
*  STAT* If patient is at the pharmacy, call can be transferred to refill team.   1. Which medications need to be refilled? (please list name of each medication and dose if known) isosorbide mononitrate (IMDUR) 120 MG 24 hr tablet  2. Which pharmacy/location (including street and city if local pharmacy) is medication to be sent to? [facility] [place], [place]  3. Do they need a 30 day or 90 day supply? [place]

## 2021-04-30 MED ORDER — ISOSORBIDE MONONITRATE ER 120 MG PO TB24: 120.0000 mg | ORAL_TABLET | Freq: Every day | ORAL | 1 refills | Status: DC

## 2021-05-07 ENCOUNTER — Telehealth: Payer: Self-pay | Admitting: Cardiology

## 2021-05-07 MED ORDER — ISOSORBIDE MONONITRATE ER 120 MG PO TB24: 120.0000 mg | ORAL_TABLET | Freq: Every day | ORAL | 0 refills | Status: DC

## 2021-05-07 NOTE — Telephone Encounter (Signed)
Pt c/o medication issue:  1. Name of Medication: isosorbide mononitrate (IMDUR) 120 MG 24 hr tablet  2. How are you currently taking this medication (dosage and times per day)? Take 1 tablet (120 mg total) by mouth daily.  3. Are you having a reaction (difficulty breathing--STAT)? no  4. What is your medication issue? Pt is needing refill sent to new pharmacy listed below:   [facility] [place], [place] [address]    [phone]  [phone] (toll-free)  [phone] Joylene Igo)  [email or web address]

## 2021-05-18 ENCOUNTER — Other Ambulatory Visit: Payer: Self-pay | Admitting: Cardiology

## 2021-05-27 NOTE — Progress Notes (Signed)
Cardiology Office Note   Date:  05/29/2021   ID:  Adelis, Docter 1955/07/03, MRN [id number]  PCP:  Guadlupe Spanish, MD  Cardiologist:   Minus Breeding, MD   Chief Complaint  Patient presents with   Palpitations      History of Present Illness: April Pena is a 65 y.o. female who presents for  management of CAD.  She has a history of non-STEMI and 03/2016, CABG x3 with SVG to OM1-OM 2, and SVG to PDA on 04/12/2016.  She has had a subsequent admission in March 2019 for recurrent chest pain that was found to be atypical and had ruled out for MI.  Lexiscan was recommended but she refused.  Other history includes renal transplant, diabetes type 2, dyslipidemia, and hypertension.  She returns for follow up.  I did not see an echocardiogram from [place].  She had some moderate pulmonary hypertension with tricuspid regurgitation in February.  Of note at [facility] last year she has an elevated cardiac enzymes but that was managed medically because of elevated creatinine.  She says she has not had chest discomfort since then.  However, she has had some palpitations.  She said this has been rare and fleeting.  She has taken nitroglycerin on a couple of occasions and took some last week.  However, she says this is unusual.  The symptoms go away quickly.  She feels her heart racing when it happens.  She is not able to bring this on.  She says she is able to do things like some grocery shopping cannot bring on this discomfort.  She is not having any PND or orthopnea.  She is not having any new shortness of breath with activities although she is somewhat limited walking with a walker because of right leg weakness.   Past Medical History:  Diagnosis Date   Blind left eye    CHF (congestive heart failure) ([place])    Diabetes mellitus    Glaucoma    Hyperlipidemia 04/11/2016   Hypertensive heart disease 04/11/2016   Kidney failure    s/p renal transplant   MI (myocardial infarction)  [facility])     Past Surgical History:  Procedure Laterality Date   ABDOMINAL HYSTERECTOMY     CARDIAC CATHETERIZATION N/A 04/08/2016   Procedure: Left Heart Cath and Coronary Angiography;  Surgeon: Burnell Blanks, MD;  Location: [place] [facility];  Service: Cardiovascular;  Laterality: N/A;   CATARACT EXTRACTION     CHOLECYSTECTOMY     CORONARY ARTERY BYPASS GRAFT N/A 04/12/2016   Procedure: CORONARY ARTERY BYPASS GRAFTING (CABG) times three using left saphenous vein harvested endoscopically;  Surgeon: Gaye Pollack, MD;  Location: [facility];  Service: Open Heart Surgery;  Laterality: N/A;  SEQ SVG-OM1-OM2 SVG-PD   EYE SURGERY     cataract   KIDNEY TRANSPLANT  2011   TEE WITHOUT CARDIOVERSION N/A 04/12/2016   Procedure: TRANSESOPHAGEAL ECHOCARDIOGRAM (TEE);  Surgeon: Gaye Pollack, MD;  Location: [place];  Service: Open Heart Surgery;  Laterality: N/A;   TONSILLECTOMY     TRIGGER FINGER RELEASE       Current Outpatient Medications  Medication Sig Dispense Refill   Accu-Chek FastClix Lancets MISC USE TO CHECK BLOOD SUGARS 5 TIMES DAILY.     ACCU-CHEK GUIDE test strip USE TO CHECK BLOOD SUGAR 5 TIMES DAILY.     acetaminophen (TYLENOL) 500 MG tablet Take 500-1,000 mg by mouth every 6 (six) hours as needed (pain).  amLODipine (NORVASC) 10 MG tablet Take 1 tablet (10 mg total) by mouth daily. 30 tablet 6   aspirin EC 81 MG tablet Take 81 mg by mouth daily.     atorvastatin (LIPITOR) 20 MG tablet TAKE 1 TABLET BY MOUTH DAILY 30 tablet 1   B Complex Vitamins (VITAMIN-B COMPLEX) TABS Take 1 tablet by mouth daily.     carboxymethylcellulose (REFRESH PLUS) 0.5 % SOLN Place 1 drop into both eyes 3 (three) times daily as needed.     cinacalcet (SENSIPAR) 30 MG tablet Take 30 mg by mouth every Monday, Wednesday, and Friday.     Cyanocobalamin 2000 MCG TBCR Take by mouth.     dexlansoprazole (DEXILANT) 60 MG capsule Take 60 mg by mouth daily.     dorzolamide-timolol (COSOPT) 22.3-6.8 MG/ML  ophthalmic solution Place 1 drop into both eyes 2 (two) times daily.     ergocalciferol (VITAMIN D2) 1.25 MG (50000 UT) capsule Take by mouth.     gabapentin (NEURONTIN) 300 MG capsule Take 1 capsule (300 mg total) by mouth daily.     insulin aspart (NOVOLOG) 100 UNIT/ML FlexPen Inject 10-30 Units into the skin 3 (three) times daily with meals. Based on CBG or carb count     Insulin Detemir (LEVEMIR FLEXTOUCH) 100 UNIT/ML Pen Inject 20 Units into the skin daily. 15 mL 11   Insulin Pen Needle (EASY TOUCH PEN NEEDLES) 31G X 5 MM MISC USE TO INJECT INSULIN FOUR TIMES A DAY     magnesium oxide (MAG-OX) 400 MG tablet Take 400 mg by mouth 2 (two) times daily.     mycophenolate (MYFORTIC) 180 MG EC tablet Take 360 mg by mouth 2 (two) times daily.      nitroGLYCERIN (NITROSTAT) 0.4 MG SL tablet Place 1 tablet (0.4 mg total) under the tongue every 5 (five) minutes as needed for chest pain. 30 tablet 0   prednisoLONE acetate (PRED FORTE) 1 % ophthalmic suspension Place 1 drop into the left eye daily.     predniSONE (DELTASONE) 5 MG tablet Take 5 mg by mouth as needed.      sodium bicarbonate 650 MG tablet Take 650 mg by mouth 2 (two) times daily.     sulfamethoxazole-trimethoprim (BACTRIM,SEPTRA) 400-80 MG per tablet Take 1 tablet by mouth every Monday, Wednesday, and Friday.      tacrolimus (PROGRAF) 1 MG capsule Take 2-3 mg by mouth See admin instructions. Take 3 capsules (3 mg) by mouth every morning and 2.5 capsules (2.5 mg) at night     furosemide (LASIX) 40 MG tablet Take 1 tablet (40 mg total) by mouth daily. 30 tablet 1   isosorbide mononitrate (IMDUR) 120 MG 24 hr tablet Take 1 tablet (120 mg total) by mouth daily. NEED OV. 90 tablet 1   metoprolol succinate (TOPROL-XL) 100 MG 24 hr tablet TAKE 1 TABLET BY MOUTH DAILY WITH OR IMMEDIATE FOLLOWING A MEAL 90 tablet 1   No current facility-administered medications for this visit.    Allergies:   Iodinated diagnostic agents, Iron, Promethazine, and  Wellbutrin [bupropion hcl]    ROS:  Please see the history of present illness.   Otherwise, review of systems are positive for none.   All other systems are reviewed and negative.    PHYSICAL EXAM: VS:  BP (!) 178/84    Pulse 93    Ht 5' 4.5" (1.638 m)    Wt 174 lb (78.9 kg)    SpO2 100%    BMI 29.41 kg/m  ,  BMI Body mass index is 29.41 kg/m. GENERAL:  Well appearing NECK:  No jugular venous distention, waveform within normal limits, carotid upstroke brisk and symmetric, no bruits, no thyromegaly LUNGS:  Clear to auscultation bilaterally CHEST:  Unremarkable HEART:  PMI not displaced or sustained,S1 and S2 within normal limits, no S3, no S4, no clicks, no rubs, 2 out of 6 apical and left mid sternal border systolic murmur, no diastolic murmurs ABD:  Flat, positive bowel sounds normal in frequency in pitch, positive bruits, no rebound, no guarding, no midline pulsatile mass, no hepatomegaly, no splenomegaly EXT:  2 plus pulses throughout, no edema, no cyanosis no clubbing   EKG:  EKG is  ordered today. The ekg ordered today demonstrates sinus rhythm, rate 93, left axis deviation, nonspecific lateral T wave changes.  No acute ST-T wave changes.   Recent Labs: No results found for requested labs within last 8760 hours.    Lipid Panel    Component Value Date/Time   CHOL 149 04/08/2016 0345   TRIG 71 04/08/2016 0345   HDL 61 04/08/2016 0345   CHOLHDL 2.4 04/08/2016 0345   VLDL 14 04/08/2016 0345   LDLCALC 74 04/08/2016 0345      Wt Readings from Last 3 Encounters:  05/29/21 174 lb (78.9 kg)  12/03/19 191 lb 12.8 oz (87 kg)  02/08/19 196 lb (88.9 kg)      Other studies Reviewed: Additional studies/ records that were reviewed today include: Labs Review of the above records demonstrates:  Please see elsewhere in the note.     ASSESSMENT AND PLAN:  Coronary artery disease:   The patient has no new sypmtoms.  No further cardiovascular testing is indicated.  We will continue  with aggressive risk reduction and meds as listed.  Hypertension:   Blood pressures is 140/90 at home and she will keep a blood pressure diary and let me know if it is not at this target.   Hypercholesterolemia:   Her LDL was 67.  No change in therapy.   TR: I was able to review an echocardiogram from [facility].  She does have some tricuspid regurgitation with some moderate pulmonary hypertension.  I would like to repeat this echocardiogram in February of next year.  Bruit: She states she had recent carotid Dopplers and is waiting for the results.  Palpitations: She will let me know if she has any increasing palpitations at which point I will order a monitor. . CKD: She follows with nephrology status post transplant.   Current medicines are reviewed at length with the patient today.  The patient does not have concerns regarding medicines.  The following changes have been made:  None  Labs/ tests ordered today include:    Orders Placed This Encounter  Procedures   EKG 12-Lead     Disposition:   FU with APP in six months.     Signed, Minus Breeding, MD  05/29/2021 5:50 PM    [facility]

## 2021-05-29 ENCOUNTER — Encounter: Payer: Self-pay | Admitting: Cardiology

## 2021-05-29 ENCOUNTER — Ambulatory Visit (INDEPENDENT_AMBULATORY_CARE_PROVIDER_SITE_OTHER): Payer: Medicare Other | Admitting: Cardiology

## 2021-05-29 ENCOUNTER — Other Ambulatory Visit: Payer: Self-pay

## 2021-05-29 VITALS — BP 178/84 | HR 93 | Ht 64.5 in | Wt 174.0 lb

## 2021-05-29 DIAGNOSIS — I10 Essential (primary) hypertension: Secondary | ICD-10-CM | POA: Diagnosis not present

## 2021-05-29 DIAGNOSIS — I2510 Atherosclerotic heart disease of native coronary artery without angina pectoris: Secondary | ICD-10-CM

## 2021-05-29 DIAGNOSIS — R011 Cardiac murmur, unspecified: Secondary | ICD-10-CM | POA: Diagnosis not present

## 2021-05-29 DIAGNOSIS — E785 Hyperlipidemia, unspecified: Secondary | ICD-10-CM

## 2021-05-29 MED ORDER — METOPROLOL SUCCINATE ER 100 MG PO TB24: ORAL_TABLET | ORAL | 1 refills | Status: DC

## 2021-05-29 MED ORDER — ISOSORBIDE MONONITRATE ER 120 MG PO TB24: 120.0000 mg | ORAL_TABLET | Freq: Every day | ORAL | 1 refills | Status: DC

## 2021-05-29 NOTE — Patient Instructions (Addendum)
Medication Instructions:  Your Physician recommend you continue on your current medication as directed.    *If you need a refill on your cardiac medications before your next appointment, please call your pharmacy*   Follow-Up: At [facility], you and your health needs are our priority.  As part of our continuing mission to provide you with exceptional heart care, we have created designated Provider Care Teams.  These Care Teams include your primary Cardiologist (physician) and Advanced Practice Providers (APPs -  Physician Assistants and Nurse Practitioners) who all work together to provide you with the care you need, when you need it.  We recommend signing up for the patient portal called "MyChart".  Sign up information is provided on this After Visit Summary.  MyChart is used to connect with patients for Virtual Visits (Telemedicine).  Patients are able to view lab/test results, encounter notes, upcoming appointments, etc.  Non-urgent messages can be sent to your provider as well.   To learn more about what you can do with MyChart, go to [email or web address].    Your next appointment:   November 19, 2021 at noon  The format for your next appointment:   In Person  Provider:   APP

## 2021-05-31 ENCOUNTER — Telehealth: Payer: Self-pay | Admitting: *Deleted

## 2021-05-31 DIAGNOSIS — I272 Pulmonary hypertension, unspecified: Secondary | ICD-10-CM

## 2021-05-31 NOTE — Telephone Encounter (Signed)
-----   Message from Minus Breeding, MD sent at 05/29/2021  5:45 PM EST ----- I saw her yesterday and have decided to have an echo to evaluate pulmonary hypertension.  Please order this in February.  She will need to be called.

## 2021-05-31 NOTE — Telephone Encounter (Signed)
Spoke with pt, aware of hochrein's recommendations.  Echo scheduled

## 2021-06-28 ENCOUNTER — Telehealth (HOSPITAL_COMMUNITY): Payer: Self-pay | Admitting: Cardiology

## 2021-06-28 NOTE — Telephone Encounter (Signed)
Will send this message to the pts ordering Provider and RN, as a general FYI.

## 2021-06-28 NOTE — Telephone Encounter (Signed)
Patient called and cancelled echocardiogram for the reason below:  06/28/2021 10:38 AM ByWILLIS, 36 M  Cancel Rsn: Patient (will call later to reschedule)  Order will be removed from the active echo wq and when patient calls back we will reinstate the order.

## 2021-07-11 ENCOUNTER — Other Ambulatory Visit (HOSPITAL_COMMUNITY): Payer: Medicare Other

## 2021-07-27 ENCOUNTER — Other Ambulatory Visit: Payer: Self-pay | Admitting: Cardiology

## 2021-07-27 DIAGNOSIS — R011 Cardiac murmur, unspecified: Secondary | ICD-10-CM

## 2021-07-27 DIAGNOSIS — I2510 Atherosclerotic heart disease of native coronary artery without angina pectoris: Secondary | ICD-10-CM

## 2021-07-27 DIAGNOSIS — I10 Essential (primary) hypertension: Secondary | ICD-10-CM

## 2021-07-27 DIAGNOSIS — E785 Hyperlipidemia, unspecified: Secondary | ICD-10-CM

## 2021-07-29 ENCOUNTER — Emergency Department (HOSPITAL_BASED_OUTPATIENT_CLINIC_OR_DEPARTMENT_OTHER): Payer: Medicare Other

## 2021-07-29 ENCOUNTER — Inpatient Hospital Stay (HOSPITAL_BASED_OUTPATIENT_CLINIC_OR_DEPARTMENT_OTHER)
Admission: EM | Admit: 2021-07-29 | Discharge: 2021-08-01 | DRG: 312 | Disposition: A | Discharge status: Home Health | Payer: Medicare Other | Attending: Internal Medicine | Admitting: Internal Medicine

## 2021-07-29 ENCOUNTER — Other Ambulatory Visit: Payer: Self-pay

## 2021-07-29 ENCOUNTER — Encounter (HOSPITAL_BASED_OUTPATIENT_CLINIC_OR_DEPARTMENT_OTHER): Payer: Self-pay | Admitting: Emergency Medicine

## 2021-07-29 DIAGNOSIS — R32 Unspecified urinary incontinence: Secondary | ICD-10-CM | POA: Diagnosis present

## 2021-07-29 DIAGNOSIS — I130 Hypertensive heart and chronic kidney disease with heart failure and stage 1 through stage 4 chronic kidney disease, or unspecified chronic kidney disease: Secondary | ICD-10-CM | POA: Diagnosis not present

## 2021-07-29 DIAGNOSIS — Z90710 Acquired absence of both cervix and uterus: Secondary | ICD-10-CM

## 2021-07-29 DIAGNOSIS — Z8249 Family history of ischemic heart disease and other diseases of the circulatory system: Secondary | ICD-10-CM

## 2021-07-29 DIAGNOSIS — I5032 Chronic diastolic (congestive) heart failure: Secondary | ICD-10-CM | POA: Diagnosis not present

## 2021-07-29 DIAGNOSIS — N184 Chronic kidney disease, stage 4 (severe): Secondary | ICD-10-CM | POA: Diagnosis present

## 2021-07-29 DIAGNOSIS — Z7982 Long term (current) use of aspirin: Secondary | ICD-10-CM | POA: Diagnosis not present

## 2021-07-29 DIAGNOSIS — I252 Old myocardial infarction: Secondary | ICD-10-CM

## 2021-07-29 DIAGNOSIS — E1122 Type 2 diabetes mellitus with diabetic chronic kidney disease: Secondary | ICD-10-CM | POA: Diagnosis not present

## 2021-07-29 DIAGNOSIS — Z7952 Long term (current) use of systemic steroids: Secondary | ICD-10-CM

## 2021-07-29 DIAGNOSIS — R55 Syncope and collapse: Secondary | ICD-10-CM | POA: Diagnosis not present

## 2021-07-29 DIAGNOSIS — I248 Other forms of acute ischemic heart disease: Secondary | ICD-10-CM | POA: Diagnosis present

## 2021-07-29 DIAGNOSIS — Y830 Surgical operation with transplant of whole organ as the cause of abnormal reaction of the patient, or of later complication, without mention of misadventure at the time of the procedure: Secondary | ICD-10-CM | POA: Diagnosis not present

## 2021-07-29 DIAGNOSIS — R4182 Altered mental status, unspecified: Secondary | ICD-10-CM

## 2021-07-29 DIAGNOSIS — E785 Hyperlipidemia, unspecified: Secondary | ICD-10-CM | POA: Diagnosis not present

## 2021-07-29 DIAGNOSIS — Z951 Presence of aortocoronary bypass graft: Secondary | ICD-10-CM

## 2021-07-29 DIAGNOSIS — E1165 Type 2 diabetes mellitus with hyperglycemia: Secondary | ICD-10-CM | POA: Diagnosis not present

## 2021-07-29 DIAGNOSIS — Z79899 Other long term (current) drug therapy: Secondary | ICD-10-CM | POA: Diagnosis not present

## 2021-07-29 DIAGNOSIS — E872 Acidosis, unspecified: Secondary | ICD-10-CM | POA: Diagnosis present

## 2021-07-29 DIAGNOSIS — Z940 Kidney transplant status: Secondary | ICD-10-CM | POA: Diagnosis not present

## 2021-07-29 DIAGNOSIS — I2510 Atherosclerotic heart disease of native coronary artery without angina pectoris: Secondary | ICD-10-CM

## 2021-07-29 DIAGNOSIS — Z20822 Contact with and (suspected) exposure to covid-19: Secondary | ICD-10-CM | POA: Diagnosis present

## 2021-07-29 DIAGNOSIS — Z87891 Personal history of nicotine dependence: Secondary | ICD-10-CM

## 2021-07-29 DIAGNOSIS — Z794 Long term (current) use of insulin: Secondary | ICD-10-CM

## 2021-07-29 DIAGNOSIS — I160 Hypertensive urgency: Secondary | ICD-10-CM | POA: Diagnosis not present

## 2021-07-29 DIAGNOSIS — E119 Type 2 diabetes mellitus without complications: Secondary | ICD-10-CM

## 2021-07-29 DIAGNOSIS — N179 Acute kidney failure, unspecified: Secondary | ICD-10-CM

## 2021-07-29 DIAGNOSIS — R778 Other specified abnormalities of plasma proteins: Secondary | ICD-10-CM

## 2021-07-29 DIAGNOSIS — Z833 Family history of diabetes mellitus: Secondary | ICD-10-CM

## 2021-07-29 DIAGNOSIS — I10 Essential (primary) hypertension: Secondary | ICD-10-CM

## 2021-07-29 DIAGNOSIS — R011 Cardiac murmur, unspecified: Secondary | ICD-10-CM

## 2021-07-29 DIAGNOSIS — T8619 Other complication of kidney transplant: Secondary | ICD-10-CM | POA: Diagnosis present

## 2021-07-29 LAB — CBC
HCT: 35.1 % — ABNORMAL LOW (ref 36.0–46.0)
Hemoglobin: 11.3 g/dL — ABNORMAL LOW (ref 12.0–15.0)
MCH: 27.8 pg (ref 26.0–34.0)
MCHC: 32.2 g/dL (ref 30.0–36.0)
MCV: 86.2 fL (ref 80.0–100.0)
Platelets: 247 10*3/uL (ref 150–400)
RBC: 4.07 MIL/uL (ref 3.87–5.11)
RDW: 14.1 % (ref 11.5–15.5)
WBC: 9.3 10*3/uL (ref 4.0–10.5)
nRBC: 0 % (ref 0.0–0.2)

## 2021-07-29 LAB — URINALYSIS, ROUTINE W REFLEX MICROSCOPIC
Bilirubin Urine: NEGATIVE
Glucose, UA: 250 mg/dL — AB
Hgb urine dipstick: NEGATIVE
Ketones, ur: NEGATIVE mg/dL
Leukocytes,Ua: NEGATIVE
Nitrite: NEGATIVE
Protein, ur: 100 mg/dL — AB
Specific Gravity, Urine: 1.02 (ref 1.005–1.030)
pH: 6.5 (ref 5.0–8.0)

## 2021-07-29 LAB — BASIC METABOLIC PANEL
Anion gap: 12 (ref 5–15)
BUN: 62 mg/dL — ABNORMAL HIGH (ref 8–23)
CO2: 20 mmol/L — ABNORMAL LOW (ref 22–32)
Calcium: 9.8 mg/dL (ref 8.9–10.3)
Chloride: 104 mmol/L (ref 98–111)
Creatinine, Ser: 2.86 mg/dL — ABNORMAL HIGH (ref 0.44–1.00)
GFR, Estimated: 18 mL/min — ABNORMAL LOW (ref >60)
Glucose, Bld: 330 mg/dL — ABNORMAL HIGH (ref 70–99)
Potassium: 4.6 mmol/L (ref 3.5–5.1)
Sodium: 136 mmol/L (ref 135–145)

## 2021-07-29 LAB — URINALYSIS, MICROSCOPIC (REFLEX)

## 2021-07-29 LAB — RESP PANEL BY RT-PCR (FLU A&B, COVID) ARPGX2
Influenza A by PCR: NEGATIVE
Influenza B by PCR: NEGATIVE
SARS Coronavirus 2 by RT PCR: NEGATIVE

## 2021-07-29 LAB — CBG MONITORING, ED: Glucose-Capillary: 290 mg/dL — ABNORMAL HIGH (ref 70–99)

## 2021-07-29 LAB — TROPONIN I (HIGH SENSITIVITY): Troponin I (High Sensitivity): 86 ng/L — ABNORMAL HIGH (ref <18)

## 2021-07-29 LAB — CK: Total CK: 113 U/L (ref 38–234)

## 2021-07-29 MED ORDER — SODIUM CHLORIDE 0.9 % IV BOLUS
1000.0000 mL | Freq: Once | INTRAVENOUS | Status: AC
  Administered 2021-07-29: 1000 mL via INTRAVENOUS

## 2021-07-29 NOTE — ED Provider Notes (Signed)
[facility] Provider Note   CSN: [id number] Arrival date & time: 07/29/21  2045     History  Chief Complaint  Patient presents with   Loss of Consciousness   Hyperglycemia   Chest Pain    April Pena is a 66 y.o. female history of diabetes, CKD here presenting with syncope.  Patient states that she woke up around 8:30 AM this morning.  She states that she then fell asleep.  She woke up around 5:30 PM and states that she did not remember what happened.  She states that she may have passed out.  She is complaining of right hip pain.  She did not know if she hit her head or not.  She states that she was on the floor that time.  She has had intermittent chest pain before she passed out but denies any chest pain currently.   The history is provided by the patient.      Home Medications Prior to Admission medications   Medication Sig Start Date End Date Taking? Authorizing Provider  Accu-Chek FastClix Lancets MISC USE TO CHECK BLOOD SUGARS 5 TIMES DAILY. 10/31/17   [provider]  ACCU-CHEK GUIDE test strip USE TO CHECK BLOOD SUGAR 5 TIMES DAILY. 09/20/19   [provider]  acetaminophen (TYLENOL) 500 MG tablet Take 500-1,000 mg by mouth every 6 (six) hours as needed (pain).     [provider]  amLODipine (NORVASC) 10 MG tablet Take 1 tablet (10 mg total) by mouth daily. 11/28/16   Erlene Quan, PA-C  aspirin EC 81 MG tablet Take 81 mg by mouth daily.    [provider]  atorvastatin (LIPITOR) 20 MG tablet TAKE 1 TABLET BY MOUTH DAILY 03/15/21   Minus Breeding, MD  B Complex Vitamins (VITAMIN-B COMPLEX) TABS Take 1 tablet by mouth daily.    [provider]  carboxymethylcellulose (REFRESH PLUS) 0.5 % SOLN Place 1 drop into both eyes 3 (three) times daily as needed.    [provider]  cinacalcet (SENSIPAR) 30 MG tablet Take 30 mg by mouth every Monday, Wednesday, and Friday.    [provider]   Cyanocobalamin 2000 MCG TBCR Take by mouth.    [provider]  dexlansoprazole (DEXILANT) 60 MG capsule Take 60 mg by mouth daily.    [provider]  dorzolamide-timolol (COSOPT) 22.3-6.8 MG/ML ophthalmic solution Place 1 drop into both eyes 2 (two) times daily.    [provider]  ergocalciferol (VITAMIN D2) 1.25 MG (50000 UT) capsule Take by mouth.    [provider]  furosemide (LASIX) 40 MG tablet Take 1 tablet (40 mg total) by mouth daily. 09/05/17 02/08/19  Molt, Bethany, DO  gabapentin (NEURONTIN) 300 MG capsule Take 1 capsule (300 mg total) by mouth daily. 04/19/16 12/27/29  Nani Skillern, PA-C  insulin aspart (NOVOLOG) 100 UNIT/ML FlexPen Inject 10-30 Units into the skin 3 (three) times daily with meals. Based on CBG or carb count    [provider]  Insulin Detemir (LEVEMIR FLEXTOUCH) 100 UNIT/ML Pen Inject 20 Units into the skin daily. 04/19/16   Nani Skillern, PA-C  Insulin Pen Needle (EASY TOUCH PEN NEEDLES) 31G X 5 MM MISC USE TO INJECT INSULIN FOUR TIMES A DAY 04/06/19   [provider]  isosorbide mononitrate (IMDUR) 120 MG 24 hr tablet Take 1 tablet (120 mg total) by mouth daily. 07/27/21   Minus Breeding, MD  magnesium oxide (MAG-OX) 400 MG tablet Take  400 mg by mouth 2 (two) times daily.    [provider]  metoprolol succinate (TOPROL-XL) 100 MG 24 hr tablet TAKE 1 TABLET BY MOUTH DAILY WITH OR IMMEDIATE FOLLOWING A MEAL 05/29/21   Minus Breeding, MD  mycophenolate (MYFORTIC) 180 MG EC tablet Take 360 mg by mouth 2 (two) times daily.     [provider]  nitroGLYCERIN (NITROSTAT) 0.4 MG SL tablet Place 1 tablet (0.4 mg total) under the tongue every 5 (five) minutes as needed for chest pain. 08/13/19   Minus Breeding, MD  prednisoLONE acetate (PRED FORTE) 1 % ophthalmic suspension Place 1 drop into the left eye daily. 03/09/19   [provider]  predniSONE (DELTASONE) 5 MG tablet Take 5  mg by mouth as needed.     [provider]  sodium bicarbonate 650 MG tablet Take 650 mg by mouth 2 (two) times daily.    [provider]  sulfamethoxazole-trimethoprim (BACTRIM,SEPTRA) 400-80 MG per tablet Take 1 tablet by mouth every Monday, Wednesday, and Friday.     [provider]  tacrolimus (PROGRAF) 1 MG capsule Take 2-3 mg by mouth See admin instructions. Take 3 capsules (3 mg) by mouth every morning and 2.5 capsules (2.5 mg) at night    [provider]      Allergies    Iodinated contrast media, Iron, Promethazine, and Wellbutrin [bupropion hcl]    Review of Systems   Review of Systems  Cardiovascular:  Positive for chest pain.  Neurological:  Positive for syncope.  All other systems reviewed and are negative.  Physical Exam Updated Vital Signs BP (!) 149/96    Pulse 90    Temp 97.6 F (36.4 C) (Oral)    Resp 17    Ht 5' 4.5" (1.638 m)    Wt 82.6 kg    SpO2 98%    BMI 30.76 kg/m  Physical Exam Vitals and nursing note reviewed.  Constitutional:      Comments: Chronically ill  HENT:     Head: Normocephalic.     Comments: No obvious scalp hematoma Eyes:     Extraocular Movements: Extraocular movements intact.     Pupils: Pupils are equal, round, and reactive to light.  Cardiovascular:     Rate and Rhythm: Normal rate and regular rhythm.     Heart sounds: Normal heart sounds.  Pulmonary:     Effort: Pulmonary effort is normal.     Breath sounds: Normal breath sounds.  Abdominal:     General: Bowel sounds are normal.     Palpations: Abdomen is soft.  Musculoskeletal:        General: Normal range of motion.     Cervical back: Normal range of motion and neck supple.     Comments: Patient has decreased range of motion of the right hip area.  No obvious deformity.  No obvious spinal tenderness.  Skin:    General: Skin is warm.     Capillary Refill: Capillary refill takes less than 2 seconds.  Neurological:     General: No focal  deficit present.     Mental Status: She is oriented to person, place, and time.  Psychiatric:        Mood and Affect: Mood normal.        Behavior: Behavior normal.    ED Results / Procedures / Treatments   Labs (all labs ordered are listed, but only abnormal results are displayed) Labs Reviewed  BASIC METABOLIC PANEL - Abnormal; Notable for  the following components:      Result Value   CO2 20 (*)    Glucose, Bld 330 (*)    BUN 62 (*)    Creatinine, Ser 2.86 (*)    GFR, Estimated 18 (*)    All other components within normal limits  CBC - Abnormal; Notable for the following components:   Hemoglobin 11.3 (*)    HCT 35.1 (*)    All other components within normal limits  CBG MONITORING, ED - Abnormal; Notable for the following components:   Glucose-Capillary 290 (*)    All other components within normal limits  TROPONIN I (HIGH SENSITIVITY) - Abnormal; Notable for the following components:   Troponin I (High Sensitivity) 86 (*)    All other components within normal limits  RESP PANEL BY RT-PCR (FLU A&B, COVID) ARPGX2  CK  URINALYSIS, ROUTINE W REFLEX MICROSCOPIC  TROPONIN I (HIGH SENSITIVITY)    EKG EKG Interpretation  Date/Time:  Sunday July 29 2021 20:56:47 EST Ventricular Rate:  93 PR Interval:  168 QRS Duration: 108 QT Interval:  406 QTC Calculation: 504 R Axis:   1 Text Interpretation: Normal sinus rhythm Right atrial enlargement Incomplete left bundle branch block Left ventricular hypertrophy with repolarization abnormality ( Cornell product ) Prolonged QT Prolonged QT new since previous Abnormal ECG When compared with ECG of 03-Sep-2017 08:33, PREVIOUS ECG IS PRESENT Confirmed by Wandra Arthurs [phone]) on 07/29/2021 10:47:26 PM  Radiology DG Chest 1 View  Result Date: 07/29/2021 CLINICAL DATA:  Diffuse pain and generalized malaise. EXAM: CHEST  1 VIEW COMPARISON:  August 23, 2020 FINDINGS: Multiple sternal wires are seen. There is mild to moderate severity  enlargement of the cardiac silhouette which is unchanged in severity. Moderate severity diffuse calcification of the thoracic aorta is seen. Both lungs are clear. The visualized skeletal structures are unremarkable. IMPRESSION: 1. Stable cardiomegaly. 2. No acute or active cardiopulmonary disease. Electronically Signed   By: Virgina Norfolk M.D.   On: 07/29/2021 22:25   CT Head Wo Contrast  Result Date: 07/29/2021 CLINICAL DATA:  Mental status changes of unknown cause. EXAM: CT HEAD WITHOUT CONTRAST TECHNIQUE: Contiguous axial images were obtained from the base of the skull through the vertex without intravenous contrast. RADIATION DOSE REDUCTION: This exam was performed according to the departmental dose-optimization program which includes automated exposure control, adjustment of the mA and/or kV according to patient size and/or use of iterative reconstruction technique. COMPARISON:  12/19/2020 FINDINGS: Brain: Age related volume loss. Chronic small-vessel ischemic changes of the white matter. No evidence of acute infarction, mass lesion, hemorrhage, hydrocephalus or extra-axial collection. Vascular: Right carotid stent is visible. There is atherosclerotic calcification of the major vessels at the base of the brain. Skull: Normal Sinuses/Orbits: Clear sinuses.  Bilateral orbital silastic implants. Other: None IMPRESSION: No acute intracranial finding. Chronic small-vessel ischemic changes of the cerebral hemispheric white matter. Electronically Signed   By: Nelson Chimes M.D.   On: 07/29/2021 22:22   CT Cervical Spine Wo Contrast  Result Date: 07/29/2021 CLINICAL DATA:  [place] today.  Neck trauma. EXAM: CT CERVICAL SPINE WITHOUT CONTRAST TECHNIQUE: Multidetector CT imaging of the cervical spine was performed without intravenous contrast. Multiplanar CT image reconstructions were also generated. RADIATION DOSE REDUCTION: This exam was performed according to the departmental dose-optimization program which  includes automated exposure control, adjustment of the mA and/or kV according to patient size and/or use of iterative reconstruction technique. COMPARISON:  05/03/2013 FINDINGS: Alignment: No traumatic malalignment. Skull base and  vertebrae: No regional fracture. Soft tissues and spinal canal: No traumatic soft tissue finding. Disc levels: Mild cervical spondylosis C5-6 but without significant canal or foraminal narrowing. Upper chest: Negative Other: Atherosclerotic calcification affecting the arterial structures in the neck. IMPRESSION: No acute or traumatic finding. Minimal mid cervical degenerative changes. Electronically Signed   By: Nelson Chimes M.D.   On: 07/29/2021 22:23   DG Hip Unilat W or Wo Pelvis 2-3 Views Right  Result Date: 07/29/2021 CLINICAL DATA:  Feeling bad for a couple of days. EXAM: DG HIP (WITH OR WITHOUT PELVIS) 2-3V RIGHT COMPARISON:  None. FINDINGS: There is no evidence of hip fracture or dislocation. Mild degenerative changes seen in the form of joint space narrowing and acetabular sclerosis. There is moderate to marked severity vascular calcification. IMPRESSION: No acute osseous abnormality. Electronically Signed   By: Virgina Norfolk M.D.   On: 07/29/2021 22:26    Procedures Procedures    Medications Ordered in ED Medications  sodium chloride 0.9 % bolus 1,000 mL (has no administration in time range)    ED Course/ Medical Decision Making/ A&P                           Medical Decision Making Hailyn Zarr is a 66 y.o. female history of CKD, diabetes here presenting with syncope.  Patient had an episode of chest pain and then woke up several hours later on the floor.  Patient could not tell me what happened.  Patient does have prolonged QT which is new for her.  I am concerned that she may have an arrhythmia.  Since she was found on the floor we will get CT head and cervical spine and x-rays.  We will check blood work and CK level.  10:49 PM Patient's creatinine  is slightly elevated to 2.8.  Her BUN is elevated to 62.  COVID test is negative.  Patient's troponin is 86 and unclear if this is demand ischemia or primary ACS.  CT head and cervical spine unremarkable.  X-rays did not show any fracture.  At this point, patient will need to be admitted on telemetry to look for arrhythmias given prolonged QT and also will need to be admitted for rule out ACS given elevated troponin.    Problems Addressed: AKI (acute kidney injury) ([place]): acute illness or injury AMS (altered mental status): acute illness or injury Elevated troponin: acute illness or injury  Amount and/or Complexity of Data Reviewed Labs: ordered. Decision-making details documented in ED Course. Radiology: ordered and independent interpretation performed. Decision-making details documented in ED Course. ECG/medicine tests: ordered and independent interpretation performed. Decision-making details documented in ED Course.  Risk Prescription drug management. Decision regarding hospitalization.   Final Clinical Impression(s) / ED Diagnoses Final diagnoses:  Elevated troponin  AKI (acute kidney injury) [facility])    Rx / [place] Orders ED Discharge Orders     None         Drenda Freeze, MD 07/29/21 2251

## 2021-07-29 NOTE — ED Triage Notes (Signed)
Reports she has felt bad for a couple of days.  Took meds this morning.  The next thing she remembers is was 1730.  Checked her sugar at home was 279.  Reports she took 18u of novolog.  Reports she is hurting all over her body.

## 2021-07-30 ENCOUNTER — Observation Stay (HOSPITAL_COMMUNITY): Payer: Medicare Other

## 2021-07-30 ENCOUNTER — Observation Stay (HOSPITAL_BASED_OUTPATIENT_CLINIC_OR_DEPARTMENT_OTHER): Payer: Medicare Other

## 2021-07-30 ENCOUNTER — Encounter (HOSPITAL_COMMUNITY): Payer: Self-pay | Admitting: Internal Medicine

## 2021-07-30 DIAGNOSIS — R55 Syncope and collapse: Principal | ICD-10-CM

## 2021-07-30 DIAGNOSIS — N184 Chronic kidney disease, stage 4 (severe): Secondary | ICD-10-CM | POA: Diagnosis present

## 2021-07-30 DIAGNOSIS — Z794 Long term (current) use of insulin: Secondary | ICD-10-CM

## 2021-07-30 DIAGNOSIS — I5032 Chronic diastolic (congestive) heart failure: Secondary | ICD-10-CM

## 2021-07-30 DIAGNOSIS — E119 Type 2 diabetes mellitus without complications: Secondary | ICD-10-CM

## 2021-07-30 DIAGNOSIS — I160 Hypertensive urgency: Secondary | ICD-10-CM | POA: Diagnosis present

## 2021-07-30 LAB — ECHOCARDIOGRAM COMPLETE
AR max vel: 1.27 cm2
AV Area VTI: 1.38 cm2
AV Area mean vel: 1.37 cm2
AV Mean grad: 14 mmHg
AV Peak grad: 27.2 mmHg
Ao pk vel: 2.61 m/s
Area-P 1/2: 3.46 cm2
Height: 64 in
MV VTI: 1.91 cm2
S' Lateral: 2.8 cm
Weight: 2971.2 oz

## 2021-07-30 LAB — BASIC METABOLIC PANEL
Anion gap: 10 (ref 5–15)
BUN: 57 mg/dL — ABNORMAL HIGH (ref 8–23)
CO2: 22 mmol/L (ref 22–32)
Calcium: 9.1 mg/dL (ref 8.9–10.3)
Chloride: 108 mmol/L (ref 98–111)
Creatinine, Ser: 2.8 mg/dL — ABNORMAL HIGH (ref 0.44–1.00)
GFR, Estimated: 18 mL/min — ABNORMAL LOW (ref >60)
Glucose, Bld: 290 mg/dL — ABNORMAL HIGH (ref 70–99)
Potassium: 5 mmol/L (ref 3.5–5.1)
Sodium: 140 mmol/L (ref 135–145)

## 2021-07-30 LAB — GLUCOSE, CAPILLARY
Glucose-Capillary: 262 mg/dL — ABNORMAL HIGH (ref 70–99)
Glucose-Capillary: 177 mg/dL — ABNORMAL HIGH (ref 70–99)
Glucose-Capillary: 176 mg/dL — ABNORMAL HIGH (ref 70–99)
Glucose-Capillary: 169 mg/dL — ABNORMAL HIGH (ref 70–99)
Glucose-Capillary: 139 mg/dL — ABNORMAL HIGH (ref 70–99)

## 2021-07-30 LAB — HIV ANTIBODY (ROUTINE TESTING W REFLEX): HIV Screen 4th Generation wRfx: NONREACTIVE

## 2021-07-30 LAB — MAGNESIUM: Magnesium: 1.8 mg/dL (ref 1.7–2.4)

## 2021-07-30 LAB — HEMOGLOBIN A1C
Hgb A1c MFr Bld: 7.9 % — ABNORMAL HIGH (ref 4.8–5.6)
Mean Plasma Glucose: 180.03 mg/dL

## 2021-07-30 LAB — TROPONIN I (HIGH SENSITIVITY): Troponin I (High Sensitivity): 88 ng/L — ABNORMAL HIGH (ref <18)

## 2021-07-30 MED ORDER — NITROGLYCERIN 2 % TD OINT
1.0000 [in_us] | TOPICAL_OINTMENT | Freq: Once | TRANSDERMAL | Status: AC
Start: 2021-07-30 — End: 2021-07-30
  Administered 2021-07-30: 1 [in_us] via TOPICAL
  Filled 2021-07-30: qty 1

## 2021-07-30 MED ORDER — SODIUM BICARBONATE 650 MG PO TABS
650.0000 mg | ORAL_TABLET | Freq: Two times a day (BID) | ORAL | Status: DC
  Administered 2021-07-30 – 2021-08-01 (×4): 650 mg via ORAL
  Filled 2021-07-30 (×4): qty 1

## 2021-07-30 MED ORDER — TACROLIMUS 1 MG PO CAPS
2.5000 mg | ORAL_CAPSULE | Freq: Two times a day (BID) | ORAL | Status: DC
  Administered 2021-07-30 – 2021-08-01 (×5): 2.5 mg via ORAL
  Filled 2021-07-30 (×5): qty 1

## 2021-07-30 MED ORDER — INSULIN ASPART 100 UNIT/ML IJ SOLN
0.0000 [IU] | Freq: Three times a day (TID) | INTRAMUSCULAR | Status: DC
  Administered 2021-07-30: 5 [IU] via SUBCUTANEOUS
  Administered 2021-07-30 (×2): 2 [IU] via SUBCUTANEOUS
  Administered 2021-07-31 (×2): 1 [IU] via SUBCUTANEOUS
  Administered 2021-07-31: 5 [IU] via SUBCUTANEOUS

## 2021-07-30 MED ORDER — MORPHINE SULFATE (PF) 2 MG/ML IV SOLN
2.0000 mg | Freq: Once | INTRAVENOUS | Status: AC
  Administered 2021-07-30: 2 mg via INTRAVENOUS
  Filled 2021-07-30: qty 1

## 2021-07-30 MED ORDER — PREDNISOLONE ACETATE 1 % OP SUSP: 1.0000 [drp] | Freq: Every day | OPHTHALMIC | Status: DC

## 2021-07-30 MED ORDER — ACETAMINOPHEN 650 MG RE SUPP: 650.0000 mg | Freq: Four times a day (QID) | RECTAL | Status: DC | PRN

## 2021-07-30 MED ORDER — ATORVASTATIN CALCIUM 10 MG PO TABS
20.0000 mg | ORAL_TABLET | Freq: Every day | ORAL | Status: DC
  Administered 2021-07-30 – 2021-07-31 (×2): 20 mg via ORAL
  Filled 2021-07-30 (×2): qty 2

## 2021-07-30 MED ORDER — SULFAMETHOXAZOLE-TRIMETHOPRIM 400-80 MG PO TABS
1.0000 | ORAL_TABLET | ORAL | Status: DC
  Administered 2021-07-30 – 2021-08-01 (×2): 1 via ORAL
  Filled 2021-07-30 (×2): qty 1

## 2021-07-30 MED ORDER — ISOSORBIDE MONONITRATE ER 30 MG PO TB24
30.0000 mg | ORAL_TABLET | Freq: Once | ORAL | Status: DC
  Filled 2021-07-30: qty 1

## 2021-07-30 MED ORDER — GUAIFENESIN-DM 100-10 MG/5ML PO SYRP
5.0000 mL | ORAL_SOLUTION | ORAL | Status: DC | PRN
  Administered 2021-07-30: 5 mL via ORAL
  Filled 2021-07-30: qty 5

## 2021-07-30 MED ORDER — FUROSEMIDE 40 MG PO TABS
40.0000 mg | ORAL_TABLET | Freq: Every day | ORAL | Status: DC
Start: 2021-07-30 — End: 2021-08-01
  Administered 2021-07-31 – 2021-08-01 (×2): 40 mg via ORAL
  Filled 2021-07-30 (×2): qty 1

## 2021-07-30 MED ORDER — AMLODIPINE BESYLATE 10 MG PO TABS
10.0000 mg | ORAL_TABLET | Freq: Every day | ORAL | Status: DC
  Administered 2021-07-30 – 2021-07-31 (×2): 10 mg via ORAL
  Filled 2021-07-30 (×3): qty 1

## 2021-07-30 MED ORDER — PREDNISONE 5 MG PO TABS
5.0000 mg | ORAL_TABLET | Freq: Every day | ORAL | Status: DC
  Administered 2021-07-31 – 2021-08-01 (×2): 5 mg via ORAL
  Filled 2021-07-30 (×3): qty 1

## 2021-07-30 MED ORDER — ISOSORBIDE MONONITRATE ER 30 MG PO TB24
30.0000 mg | ORAL_TABLET | Freq: Every day | ORAL | Status: DC
  Administered 2021-07-30: 30 mg via ORAL
  Filled 2021-07-30: qty 1

## 2021-07-30 MED ORDER — DORZOLAMIDE HCL-TIMOLOL MAL 2-0.5 % OP SOLN
1.0000 [drp] | Freq: Two times a day (BID) | OPHTHALMIC | Status: DC
  Administered 2021-07-30 – 2021-08-01 (×4): 1 [drp] via OPHTHALMIC
  Filled 2021-07-30: qty 10

## 2021-07-30 MED ORDER — ACETAMINOPHEN 325 MG PO TABS
650.0000 mg | ORAL_TABLET | Freq: Four times a day (QID) | ORAL | Status: DC | PRN
  Filled 2021-07-30: qty 2

## 2021-07-30 MED ORDER — ISOSORBIDE MONONITRATE ER 60 MG PO TB24
60.0000 mg | ORAL_TABLET | Freq: Every day | ORAL | Status: DC
Start: 2021-07-31 — End: 2021-08-01
  Administered 2021-07-31: 60 mg via ORAL
  Filled 2021-07-30 (×2): qty 1

## 2021-07-30 MED ORDER — ARIPIPRAZOLE 5 MG PO TABS
10.0000 mg | ORAL_TABLET | Freq: Every day | ORAL | Status: DC
  Administered 2021-07-30 – 2021-07-31 (×2): 10 mg via ORAL
  Filled 2021-07-30 (×2): qty 2

## 2021-07-30 MED ORDER — MYCOPHENOLATE SODIUM 180 MG PO TBEC
360.0000 mg | DELAYED_RELEASE_TABLET | Freq: Two times a day (BID) | ORAL | Status: DC
  Administered 2021-07-30 – 2021-08-01 (×4): 360 mg via ORAL
  Filled 2021-07-30 (×5): qty 2

## 2021-07-30 MED ORDER — MAGNESIUM SULFATE 2 GM/50ML IV SOLN
2.0000 g | Freq: Once | INTRAVENOUS | Status: AC
  Administered 2021-07-30: 2 g via INTRAVENOUS
  Filled 2021-07-30: qty 50

## 2021-07-30 MED ORDER — HYDRALAZINE HCL 20 MG/ML IJ SOLN
5.0000 mg | INTRAMUSCULAR | Status: DC | PRN
  Administered 2021-07-30: 5 mg via INTRAVENOUS
  Filled 2021-07-30: qty 1

## 2021-07-30 MED ORDER — TRAMADOL HCL 50 MG PO TABS
50.0000 mg | ORAL_TABLET | Freq: Once | ORAL | Status: AC | PRN
  Administered 2021-07-30: 50 mg via ORAL
  Filled 2021-07-30: qty 1

## 2021-07-30 MED ORDER — HEPARIN SODIUM (PORCINE) 5000 UNIT/ML IJ SOLN
5000.0000 [IU] | Freq: Three times a day (TID) | INTRAMUSCULAR | Status: DC
  Administered 2021-07-30 – 2021-08-01 (×7): 5000 [IU] via SUBCUTANEOUS
  Filled 2021-07-30 (×7): qty 1

## 2021-07-30 MED ORDER — INSULIN ASPART 100 UNIT/ML IJ SOLN: 0.0000 [IU] | Freq: Every day | INTRAMUSCULAR | Status: DC

## 2021-07-30 MED ORDER — METOPROLOL SUCCINATE ER 25 MG PO TB24
25.0000 mg | ORAL_TABLET | Freq: Every day | ORAL | Status: DC
  Administered 2021-07-31 – 2021-08-01 (×2): 25 mg via ORAL
  Filled 2021-07-30 (×3): qty 1

## 2021-07-30 NOTE — Assessment & Plan Note (Addendum)
Poorly controlled.  Blood glucose elevated in the 300s.  Bicarb slightly low in the setting of CKD stage IV with normal anion gap.  No ketones on UA. -Check A1c.  Sliding scale insulin sensitive ACHS.  Resume home basal insulin after pharmacy med rec is done.  Consult diabetes coordinator.

## 2021-07-30 NOTE — Progress Notes (Signed)
Echocardiogram 2D Echocardiogram has been performed.  Oneal Deputy Athea Haley RDCS 07/30/2021, 10:09 AM

## 2021-07-30 NOTE — ED Notes (Signed)
Report given to [facility] with [facility]

## 2021-07-30 NOTE — Subjective & Objective (Addendum)
Patient is a 66 year old female with a past medical history of CAD status post CABG x3, chronic diastolic CHF, insulin-dependent type II diabetes, hyperlipidemia, hypertension, history of renal transplant/CKD stage IV presented to the ED after a syncopal event.  Hypertensive in the ED with blood pressure 189/87 on arrival.  Not hypoxic.  No fever or leukocytosis.  Hemoglobin 11.3, stable.  Creatinine 2.8, stable.  Bicarb 20.  Glucose 330.  UA not suggestive of infection.  High-sensitivity troponin elevated but stable (86 > 88).  EKG showing sinus rhythm, QTc 504, and no acute ischemic changes.  CK normal.  COVID and flu negative.  CT head/C-spine negative for acute finding.  X-ray of right hip/pelvis negative for fracture or dislocation.  Chest x-ray negative for acute finding. Medications administered include nitroglycerin ointment and 1 L fluid bolus.  Patient states yesterday morning she took her medications around 9 AM and then sat down to have Ensure.  She remembers opening the bottle of Ensure and does not know what happened afterwards.  States she woke up on the floor at 5 PM and noticed that her pad was soaked with urine and she was sweating.  Her heart was beating fast at that time and she felt slight pain on the left side of her chest.  States the pain is only felt when she touches her chest but not otherwise.  She checked her blood sugar at that time and it was in the upper 200s.  Denies fevers, cough, shortness of breath, nausea, vomiting, abdominal pain, or dysuria.  She had diarrhea 2 days ago which has stopped.  States she has not been feeling good lately and was told by her primary care physician that it was due to her vitamin D level being low.  Patient states about 9 years ago she had an episode where she suddenly collapsed while walking and was evaluated by a doctor and told that she was having "mini seizures."  Patient is complaining of a headache at this time and requesting medication.   Reports history of chronic headaches and states Tylenol has not worked in the past.

## 2021-07-30 NOTE — ED Notes (Signed)
Meal given to pt.

## 2021-07-30 NOTE — TOC Initial Note (Signed)
Transition of Care [facility]) - Initial/Assessment Note    Patient Details  Name: April Pena MRN: [id number] Date of Birth: [birth year]  Transition of Care [facility]) CM/SW Contact:    Zenon Mayo, RN Phone Number: 07/30/2021, 2:48 PM  Clinical Narrative:                 NCM spoke with patient at bedside offered choice, she is ok with Alvis Lemmings, for [facility], [place],  NCM made referral to [facility] with [facility], he is able to take referral. Soc will begin 24 to 48 hrs post dc.    Expected Discharge Plan: [place] Barriers to Discharge: Continued Medical Work up   Patient Goals and CMS Choice Patient states their goals for this hospitalization and ongoing recovery are:: home with [facility] CMS Medicare.gov Compare Post Acute Care list provided to:: Patient Choice offered to / list presented to : Patient  Expected Discharge Plan and Services Expected Discharge Plan: [place]   Discharge Planning Services: CM Consult Post Acute Care Choice: Home Health                     DME Agency: NA       HH Arranged: RN, PT [facility] Agency: [place] Date [facility] Agency Contacted: 07/30/21 Time HH Agency Contacted: 45 Representative spoke with at [place]: Tommi Rumps  Prior Living Arrangements/Services   Lives with:: Self Patient language and need for interpreter reviewed:: Yes Do you feel safe going back to the place where you live?: Yes      Need for Family Participation in Patient Care: No (Comment) Care giver support system in place?: Yes (comment)   Criminal Activity/Legal Involvement Pertinent to Current Situation/Hospitalization: No - Comment as needed  Activities of Daily Living Home Assistive Devices/Equipment: Walker (specify type), Cane (specify quad or straight) ADL Screening (condition at time of admission) Patient's cognitive ability adequate to safely complete daily activities?: Yes Is the patient deaf or have difficulty hearing?: No Does  the patient have difficulty seeing, even when wearing glasses/contacts?: Yes Does the patient have difficulty concentrating, remembering, or making decisions?: No Patient able to express need for assistance with ADLs?: Yes Does the patient have difficulty dressing or bathing?: No Independently performs ADLs?: Yes (appropriate for developmental age) Does the patient have difficulty walking or climbing stairs?: Yes Weakness of Legs: None Weakness of Arms/Hands: None  Permission Sought/Granted                  Emotional Assessment Appearance:: Appears stated age Attitude/Demeanor/Rapport: Engaged Affect (typically observed): Appropriate Orientation: : Oriented to  Time, Oriented to Situation, Oriented to Place, Oriented to Self Alcohol / Substance Use: Not Applicable Psych Involvement: No (comment)  Admission diagnosis:  Syncope [R55] Elevated troponin [R77.8] AKI (acute kidney injury) ([place]) [N17.9] AMS (altered mental status) [R41.82] Patient Active Problem List   Diagnosis Date Noted   Hypertensive urgency 07/30/2021   CKD (chronic kidney disease), stage IV ([place]) 07/30/2021   Insulin dependent type 2 diabetes mellitus ([place]) 07/30/2021   Chronic diastolic CHF (congestive heart failure) ([place]) 07/30/2021   Syncope 07/29/2021   Nonrheumatic tricuspid valve regurgitation 10/21/2020   Pulmonary hypertension, unspecified ([place]) 10/21/2020   Murmur 12/19/2019   Educated about COVID-19 virus infection 12/02/2019   CAD (coronary artery disease) 12/02/2019   Atypical chest pain 09/03/2017   Acute on chronic diastolic (congestive) heart failure ([place]) 09/03/2017   Chest pressure    Acute diastolic (  congestive) heart failure [facility])    S/p cadaver renal transplant 2011 04/17/2016   Chronic renal disease, stage III s/p transplant 04/17/2016   Acute on chronic renal insufficiency post CABG 04/17/2016   S/P CABG x 3 -Nov 2017 04/12/2016   Essential hypertension 04/11/2016   Dyslipidemia  04/11/2016   Groin hematoma, initial encounter 04/11/2016   Diabetes mellitus type 2 in obese ([place]) 04/11/2016   NSTEMI (non-ST elevated myocardial infarction) ([place]) 04/07/2016   Low back pain radiating to right leg 04/04/2011   PCP:  Guadlupe Spanish, MD Pharmacy:   [facility], [place] [address] Phone: [phone] Fax: [phone]  [facility] [place], [address] [place] [address] Phone: [phone] Fax: [phone]  [facility] [address] [place], [place] [address] Phone: [phone] Fax: [phone]  [facility] [place], [place] [address] [place] [address] [place] [address] Phone: [phone] Fax: [phone]     Social Determinants of Health (SDOH) Interventions    Readmission Risk Interventions No flowsheet data found.

## 2021-07-30 NOTE — Assessment & Plan Note (Addendum)
Chest pain Elevated troponin QT prolongation Chest pain appears atypical, only when she touches her chest but not otherwise.  ACS less likely as troponin elevated but flat and EKG without acute ischemic changes.  Troponin elevation likely due to demand ischemia in the setting of elevated blood pressure.  There is concern for arrhythmia causing syncope as patient reports waking up with palpitations.  EKG showing sinus rhythm, QTc 504.  QT interval slightly increased since prior tracing from December 2022.  Potassium level is normal.  Chest x-ray negative for acute cardiopulmonary process.  CT head negative for acute finding and no focal neurodeficit on exam. ?Seizure given urinary incontinence.  PE less likely given no tachycardia or hypoxia. -Cardiac monitoring, echocardiogram, carotid Dopplers, EEG, orthostatics.  Check magnesium level and keep above 4.  Avoid QT prolonging drugs and repeat EKG in a.m.  Addendum 2/28 3:54 AM: She does have a murmur on exam, best appreciated at the right upper sternal border second intercostal space.  Previous echo from July 2021 showing mild aortic stenosis.  Echocardiogram ordered to see if this has worsened.

## 2021-07-30 NOTE — Assessment & Plan Note (Signed)
EKG showing QTc 504, slightly increased since EKG done in December 2022.  Potassium is normal. -Cardiac monitoring.  Check magnesium level and keep above 2.  Avoid QT prolonging drugs if possible.  Repeat EKG in a.m.

## 2021-07-30 NOTE — Assessment & Plan Note (Addendum)
Blood pressure now improved with systolic in the 093O after nitroglycerin ointment. -Hold home oral medications at this time and check orthostatics given syncope.  Hydralazine prn SBP >170.

## 2021-07-30 NOTE — Evaluation (Signed)
Physical Therapy Evaluation Patient Details Name: April Pena MRN: [id number] DOB: 03-Apr-1956 Today's Date: 07/30/2021  History of Present Illness  Pt is a 66 y/o female admitted secondary to syncope. PMH includes CAD, CKD, DM, dCHF, and glaucoma.  Clinical Impression  Pt admitted secondary to problem above with deficits below. Pt requiring min guard to supervision for mobility tasks using rollator. Overall tolerated well. Would benefit from continued ambulation throughout hospitalization from mobility team. Recommending HHPT to assess safety with mobility tasks at home. No further acute skilled PT needs at this time. Will sign off. If needs change, please re-consult.        Recommendations for follow up therapy are one component of a multi-disciplinary discharge planning process, led by the attending physician.  Recommendations may be updated based on patient status, additional functional criteria and insurance authorization.  Follow Up Recommendations Home health PT    Assistance Recommended at Discharge Intermittent Supervision/Assistance  Patient can return home with the following  A little help with bathing/dressing/bathroom;Help with stairs or ramp for entrance;Assist for transportation    Equipment Recommendations None recommended by PT  Recommendations for Other Services       Functional Status Assessment Patient has had a recent decline in their functional status and demonstrates the ability to make significant improvements in function in a reasonable and predictable amount of time.     Precautions / Restrictions Precautions Precautions: Fall Restrictions Weight Bearing Restrictions: No      Mobility  Bed Mobility Overal bed mobility: Needs Assistance Bed Mobility: Supine to Sit, Sit to Supine     Supine to sit: Min guard Sit to supine: Supervision   General bed mobility comments: Min guard for safety to come to sitting as pt feeling very hot. BP checked and  WFL. NT checked CBG as well.    Transfers Overall transfer level: Needs assistance Equipment used: Rollator (4 wheels) Transfers: Sit to/from Stand Sit to Stand: Supervision           General transfer comment: Supervision for safety.    Ambulation/Gait Ambulation/Gait assistance: Supervision Gait Distance (Feet): 50 Feet Assistive device: Rollator (4 wheels) Gait Pattern/deviations: Step-through pattern, Decreased stride length Gait velocity: Decreased     General Gait Details: Overall steady when performing laps in her room. No overt LOB noted. RN present to give meds so further mobility deferred.  Stairs            Wheelchair Mobility    Modified Rankin (Stroke Patients Only)       Balance Overall balance assessment: Needs assistance Sitting-balance support: No upper extremity supported, Feet supported Sitting balance-Leahy Scale: Fair     Standing balance support: Bilateral upper extremity supported Standing balance-Leahy Scale: Poor Standing balance comment: Reliant on BUE support                             Pertinent Vitals/Pain Pain Assessment Pain Assessment: No/denies pain    Home Living Family/patient expects to be discharged to:: Private residence Living Arrangements: Alone Available Help at Discharge: Family;Available 24 hours/day Type of Home: Apartment Home Access: Level entry       Home Layout: One level Home Equipment: Rollator (4 wheels) Additional Comments: Reports she plans to stay with her daughter at d/c. She has no steps and able to stay on 1st floor.    Prior Function Prior Level of Function : Independent/Modified Independent  Mobility Comments: Uses rollator occasionally       Hand Dominance        Extremity/Trunk Assessment   Upper Extremity Assessment Upper Extremity Assessment: Overall WFL for tasks assessed    Lower Extremity Assessment Lower Extremity Assessment: Generalized  weakness    Cervical / Trunk Assessment Cervical / Trunk Assessment: Normal  Communication   Communication: No difficulties  Cognition Arousal/Alertness: Awake/alert Behavior During Therapy: WFL for tasks assessed/performed Overall Cognitive Status: Within Functional Limits for tasks assessed                                          General Comments      Exercises     Assessment/Plan    PT Assessment All further PT needs can be met in the next venue of care  PT Problem List Decreased strength;Decreased activity tolerance;Decreased balance;Decreased mobility;Decreased knowledge of use of DME;Decreased knowledge of precautions       PT Treatment Interventions      PT Goals (Current goals can be found in the Care Plan section)  Acute Rehab PT Goals Patient Stated Goal: to go home PT Goal Formulation: With patient Time For Goal Achievement: 07/30/21 Potential to Achieve Goals: Good    Frequency       Co-evaluation               AM-PAC PT "6 Clicks" Mobility  Outcome Measure Help needed turning from your back to your side while in a flat bed without using bedrails?: None Help needed moving from lying on your back to sitting on the side of a flat bed without using bedrails?: A Little Help needed moving to and from a bed to a chair (including a wheelchair)?: A Little Help needed standing up from a chair using your arms (e.g., wheelchair or bedside chair)?: A Little Help needed to walk in hospital room?: A Little Help needed climbing 3-5 steps with a railing? : A Little 6 Click Score: 19    End of Session Equipment Utilized During Treatment: Gait belt Activity Tolerance: Patient tolerated treatment well Patient left: in bed;with call bell/phone within reach;with nursing/sitter in room Nurse Communication: Mobility status PT Visit Diagnosis: Unsteadiness on feet (R26.81);Muscle weakness (generalized) (M62.81)    Time: 5456-2563 PT Time  Calculation (min) (ACUTE ONLY): 18 min   Charges:   PT Evaluation $PT Eval Low Complexity: 1 Low          Lou Miner, DPT  Acute Rehabilitation Services  Pager: [phone] Office: [phone]   Rudean Hitt 07/30/2021, 1:31 PM

## 2021-07-30 NOTE — Plan of Care (Signed)

## 2021-07-30 NOTE — Assessment & Plan Note (Addendum)
History of renal transplant Mild metabolic acidosis Creatinine 2.8, stable.  On bicarb supplement at home. -Continue to monitor renal function and bicarb level.  Resume home meds after pharmacy med rec is done.

## 2021-07-30 NOTE — Progress Notes (Signed)
Carotid duplex bilateral study completed.   Please see CV Proc for preliminary results.   Juergen Hardenbrook, RDMS, RVT  

## 2021-07-30 NOTE — H&P (Addendum)
History and Physical    April Pena QMV:[id number] DOB: 1955-09-16 DOA: 07/29/2021  DOS: the patient was seen and examined on 07/29/2021  PCP: Guadlupe Spanish, MD   Patient coming from: [facility] ED  I have personally briefly reviewed patient's old medical records in [place]  Patient is a 66 year old female with a past medical history of CAD status post CABG x3, chronic diastolic CHF, insulin-dependent type II diabetes, hyperlipidemia, hypertension, history of renal transplant/CKD stage IV presented to the ED after a syncopal event.  Hypertensive in the ED with blood pressure 189/87 on arrival.  Not hypoxic.  No fever or leukocytosis.  Hemoglobin 11.3, stable.  Creatinine 2.8, stable.  Bicarb 20.  Glucose 330.  UA not suggestive of infection.  High-sensitivity troponin elevated but stable (86 > 88).  EKG showing sinus rhythm, QTc 504, and no acute ischemic changes.  CK normal.  COVID and flu negative.  CT head/C-spine negative for acute finding.  X-ray of right hip/pelvis negative for fracture or dislocation.  Chest x-ray negative for acute finding. Medications administered include nitroglycerin ointment and 1 L fluid bolus.  Patient states yesterday morning she took her medications around 9 AM and then sat down to have Ensure.  She remembers opening the bottle of Ensure and does not know what happened afterwards.  States she woke up on the floor at 5 PM and noticed that her pad was soaked with urine and she was sweating.  Her heart was beating fast at that time and she felt slight pain on the left side of her chest.  States the pain is only felt when she touches her chest but not otherwise.  She checked her blood sugar at that time and it was in the upper 200s.  Denies fevers, cough, shortness of breath, nausea, vomiting, abdominal pain, or dysuria.  She had diarrhea 2 days ago which has stopped.  States she has not been feeling good lately and was told by her primary care physician that it  was due to her vitamin D level being low.  Patient states about 9 years ago she had an episode where she suddenly collapsed while walking and was evaluated by a doctor and told that she was having "mini seizures."  Patient is complaining of a headache at this time and requesting medication.  Reports history of chronic headaches and states Tylenol has not worked in the past.   Review of Systems:  Review of Systems  All other systems reviewed and are negative.  Past Medical History:  Diagnosis Date   Blind left eye    CHF (congestive heart failure) ([place])    Diabetes mellitus    Glaucoma    Hyperlipidemia 04/11/2016   Hypertensive heart disease 04/11/2016   Kidney failure    s/p renal transplant   MI (myocardial infarction) [facility])     Past Surgical History:  Procedure Laterality Date   ABDOMINAL HYSTERECTOMY     CARDIAC CATHETERIZATION N/A 04/08/2016   Procedure: Left Heart Cath and Coronary Angiography;  Surgeon: Burnell Blanks, MD;  Location: [place] CV LAB;  Service: Cardiovascular;  Laterality: N/A;   CATARACT EXTRACTION     CHOLECYSTECTOMY     CORONARY ARTERY BYPASS GRAFT N/A 04/12/2016   Procedure: CORONARY ARTERY BYPASS GRAFTING (CABG) times three using left saphenous vein harvested endoscopically;  Surgeon: Gaye Pollack, MD;  Location: [place] OR;  Service: Open Heart Surgery;  Laterality: N/A;  SEQ SVG-OM1-OM2 SVG-PD   EYE SURGERY  cataract   KIDNEY TRANSPLANT  2011   TEE WITHOUT CARDIOVERSION N/A 04/12/2016   Procedure: TRANSESOPHAGEAL ECHOCARDIOGRAM (TEE);  Surgeon: Gaye Pollack, MD;  Location: [place];  Service: Open Heart Surgery;  Laterality: N/A;   TONSILLECTOMY     TRIGGER FINGER RELEASE       reports that she has quit smoking. She has never used smokeless tobacco. She reports that she does not drink alcohol and does not use drugs.  Allergies  Allergen Reactions   Iodinated Contrast Media Other (See Comments)    Kidneys   Iron Hives and Other (See  Comments)    Elevated blood sugar, glaucoma worsened,    Promethazine Other (See Comments)    Pt states her muscles jerk   Wellbutrin [Bupropion Hcl] Nausea And Vomiting    Family History  Problem Relation Age of Onset   Diabetes Mother    Hypertension Mother    Heart attack Mother    Diabetes Father    Hypertension Sister    Diabetes Brother    Hypertension Brother    Diabetes Paternal Grandmother    Diabetes Daughter    Hypertension Daughter    Hyperlipidemia Neg Hx    Sudden death Neg Hx     Prior to Admission medications   Medication Sig Start Date End Date Taking? Authorizing Provider  amLODipine (NORVASC) 10 MG tablet Take 1 tablet (10 mg total) by mouth daily. 11/28/16  Yes Erlene Quan, PA-C  aspirin EC 81 MG tablet Take 81 mg by mouth daily.   Yes [provider]  atorvastatin (LIPITOR) 20 MG tablet TAKE 1 TABLET BY MOUTH DAILY 03/15/21  Yes Minus Breeding, MD  B Complex Vitamins (VITAMIN-B COMPLEX) TABS Take 1 tablet by mouth daily.   Yes [provider]  carboxymethylcellulose (REFRESH PLUS) 0.5 % SOLN Place 1 drop into both eyes 3 (three) times daily as needed.   Yes [provider]  Cyanocobalamin 2000 MCG TBCR Take by mouth.   Yes [provider]  dorzolamide-timolol (COSOPT) 22.3-6.8 MG/ML ophthalmic solution Place 1 drop into both eyes 2 (two) times daily.   Yes [provider]  ergocalciferol (VITAMIN D2) 1.25 MG (50000 UT) capsule Take by mouth.   Yes [provider]  gabapentin (NEURONTIN) 300 MG capsule Take 1 capsule (300 mg total) by mouth daily. 04/19/16 12/27/29 Yes Lars Pinks M, PA-C  insulin aspart (NOVOLOG) 100 UNIT/ML FlexPen Inject 10-30 Units into the skin 3 (three) times daily with meals. Based on CBG or carb count   Yes [provider]  Insulin Detemir (LEVEMIR FLEXTOUCH) 100 UNIT/ML Pen Inject 20 Units into the skin daily. 04/19/16  Yes Lars Pinks M, PA-C  isosorbide  mononitrate (IMDUR) 120 MG 24 hr tablet Take 1 tablet (120 mg total) by mouth daily. 07/27/21  Yes Minus Breeding, MD  mycophenolate (MYFORTIC) 180 MG EC tablet Take 360 mg by mouth 2 (two) times daily.    Yes [provider]  nitroGLYCERIN (NITROSTAT) 0.4 MG SL tablet Place 1 tablet (0.4 mg total) under the tongue every 5 (five) minutes as needed for chest pain. 08/13/19  Yes Minus Breeding, MD  prednisoLONE acetate (PRED FORTE) 1 % ophthalmic suspension Place 1 drop into the left eye daily. 03/09/19  Yes [provider]  predniSONE (DELTASONE) 5 MG tablet Take 5 mg by mouth as needed.    Yes [provider]  sodium bicarbonate 650 MG tablet Take 650 mg by mouth 2 (two) times daily.  Yes [provider]  sulfamethoxazole-trimethoprim (BACTRIM,SEPTRA) 400-80 MG per tablet Take 1 tablet by mouth every Monday, Wednesday, and Friday.    Yes [provider]  tacrolimus (PROGRAF) 1 MG capsule Take 2-3 mg by mouth See admin instructions. Take 3 capsules (3 mg) by mouth every morning and 2.5 capsules (2.5 mg) at night   Yes [provider]  Accu-Chek FastClix Lancets MISC USE TO CHECK BLOOD SUGARS 5 TIMES DAILY. 10/31/17   [provider]  ACCU-CHEK GUIDE test strip USE TO CHECK BLOOD SUGAR 5 TIMES DAILY. 09/20/19   [provider]  acetaminophen (TYLENOL) 500 MG tablet Take 500-1,000 mg by mouth every 6 (six) hours as needed (pain).     [provider]  cinacalcet (SENSIPAR) 30 MG tablet Take 30 mg by mouth every Monday, Wednesday, and Friday.    [provider]  dexlansoprazole (DEXILANT) 60 MG capsule Take 60 mg by mouth daily.    [provider]  furosemide (LASIX) 40 MG tablet Take 1 tablet (40 mg total) by mouth daily. 09/05/17 02/08/19  Molt, Bethany, DO  Insulin Pen Needle (EASY TOUCH PEN NEEDLES) 31G X 5 MM MISC USE TO INJECT INSULIN FOUR TIMES A DAY 04/06/19   [provider]  magnesium oxide  (MAG-OX) 400 MG tablet Take 400 mg by mouth 2 (two) times daily.    [provider]  metoprolol succinate (TOPROL-XL) 100 MG 24 hr tablet TAKE 1 TABLET BY MOUTH DAILY WITH OR IMMEDIATE FOLLOWING A MEAL 05/29/21   Minus Breeding, MD    Physical Exam: Vitals:   07/30/21 0052 07/30/21 0155 07/30/21 0205 07/30/21 0208  BP: (!) 196/86   (!) 165/62  Pulse:      Resp: 18   18  Temp:   98.5 F (36.9 C)   TempSrc:      SpO2: 98%   100%  Weight:  84.2 kg    Height:  5\' 4"  (1.626 m)      Physical Exam Constitutional:      General: She is not in acute distress. Eyes:     Conjunctiva/sclera: Conjunctivae normal.  Cardiovascular:     Rate and Rhythm: Normal rate and regular rhythm.     Pulses: Normal pulses.  Pulmonary:     Effort: Pulmonary effort is normal. No respiratory distress.     Breath sounds: Normal breath sounds. No wheezing or rales.  Abdominal:     General: Bowel sounds are normal. There is no distension.     Palpations: Abdomen is soft.     Tenderness: There is no abdominal tenderness.  Musculoskeletal:        General: No swelling or tenderness.     Cervical back: Normal range of motion and neck supple.  Skin:    General: Skin is warm and dry.  Neurological:     General: No focal deficit present.     Mental Status: She is alert and oriented to person, place, and time.     Labs on Admission: I have personally reviewed following labs and imaging studies  CBC: Recent Labs  Lab 07/29/21 2107  WBC 9.3  HGB 11.3*  HCT 35.1*  MCV 86.2  PLT 539   Basic Metabolic Panel: Recent Labs  Lab 07/29/21 2107  NA 136  K 4.6  CL 104  CO2 20*  GLUCOSE 330*  BUN 62*  CREATININE 2.86*  CALCIUM 9.8   GFR: Estimated Creatinine Clearance: 20.6 mL/min (A) (by C-G formula based on SCr of  2.86 mg/dL (H)). Liver Function Tests: No results for input(s): AST, ALT, ALKPHOS, BILITOT, PROT, ALBUMIN in the last 168 hours. No results for input(s): LIPASE, AMYLASE in the  last 168 hours. No results for input(s): AMMONIA in the last 168 hours. Coagulation Profile: No results for input(s): INR, PROTIME in the last 168 hours. Cardiac Enzymes: Recent Labs  Lab 07/29/21 2107  CKTOTAL 113   BNP (last 3 results) No results for input(s): PROBNP in the last 8760 hours. HbA1C: No results for input(s): HGBA1C in the last 72 hours. CBG: Recent Labs  Lab 07/29/21 2102  GLUCAP 290*   Lipid Profile: No results for input(s): CHOL, HDL, LDLCALC, TRIG, CHOLHDL, LDLDIRECT in the last 72 hours. Thyroid Function Tests: No results for input(s): TSH, T4TOTAL, FREET4, T3FREE, THYROIDAB in the last 72 hours. Anemia Panel: No results for input(s): VITAMINB12, FOLATE, FERRITIN, TIBC, IRON, RETICCTPCT in the last 72 hours. Urine analysis:    Component Value Date/Time   COLORURINE YELLOW 07/29/2021 2107   APPEARANCEUR CLEAR 07/29/2021 2107   LABSPEC 1.020 07/29/2021 2107   PHURINE 6.5 07/29/2021 2107   GLUCOSEU 250 (A) 07/29/2021 2107   HGBUR NEGATIVE 07/29/2021 2107   BILIRUBINUR NEGATIVE 07/29/2021 2107   Blunt NEGATIVE 07/29/2021 2107   PROTEINUR 100 (A) 07/29/2021 2107   UROBILINOGEN 0.2 02/26/2009 2001   NITRITE NEGATIVE 07/29/2021 2107   LEUKOCYTESUR NEGATIVE 07/29/2021 2107    Radiological Exams on Admission: I have personally reviewed images DG Chest 1 View  Result Date: 07/29/2021 CLINICAL DATA:  Diffuse pain and generalized malaise. EXAM: CHEST  1 VIEW COMPARISON:  August 23, 2020 FINDINGS: Multiple sternal wires are seen. There is mild to moderate severity enlargement of the cardiac silhouette which is unchanged in severity. Moderate severity diffuse calcification of the thoracic aorta is seen. Both lungs are clear. The visualized skeletal structures are unremarkable. IMPRESSION: 1. Stable cardiomegaly. 2. No acute or active cardiopulmonary disease. Electronically Signed   By: Virgina Norfolk M.D.   On: 07/29/2021 22:25   CT Head Wo  Contrast  Result Date: 07/29/2021 CLINICAL DATA:  Mental status changes of unknown cause. EXAM: CT HEAD WITHOUT CONTRAST TECHNIQUE: Contiguous axial images were obtained from the base of the skull through the vertex without intravenous contrast. RADIATION DOSE REDUCTION: This exam was performed according to the departmental dose-optimization program which includes automated exposure control, adjustment of the mA and/or kV according to patient size and/or use of iterative reconstruction technique. COMPARISON:  12/19/2020 FINDINGS: Brain: Age related volume loss. Chronic small-vessel ischemic changes of the white matter. No evidence of acute infarction, mass lesion, hemorrhage, hydrocephalus or extra-axial collection. Vascular: Right carotid stent is visible. There is atherosclerotic calcification of the major vessels at the base of the brain. Skull: Normal Sinuses/Orbits: Clear sinuses.  Bilateral orbital silastic implants. Other: None IMPRESSION: No acute intracranial finding. Chronic small-vessel ischemic changes of the cerebral hemispheric white matter. Electronically Signed   By: Nelson Chimes M.D.   On: 07/29/2021 22:22   CT Cervical Spine Wo Contrast  Result Date: 07/29/2021 CLINICAL DATA:  [place] today.  Neck trauma. EXAM: CT CERVICAL SPINE WITHOUT CONTRAST TECHNIQUE: Multidetector CT imaging of the cervical spine was performed without intravenous contrast. Multiplanar CT image reconstructions were also generated. RADIATION DOSE REDUCTION: This exam was performed according to the departmental dose-optimization program which includes automated exposure control, adjustment of the mA and/or kV according to patient size and/or use of iterative reconstruction technique. COMPARISON:  05/03/2013 FINDINGS: Alignment: No traumatic malalignment. Skull base  and vertebrae: No regional fracture. Soft tissues and spinal canal: No traumatic soft tissue finding. Disc levels: Mild cervical spondylosis C5-6 but without  significant canal or foraminal narrowing. Upper chest: Negative Other: Atherosclerotic calcification affecting the arterial structures in the neck. IMPRESSION: No acute or traumatic finding. Minimal mid cervical degenerative changes. Electronically Signed   By: Nelson Chimes M.D.   On: 07/29/2021 22:23   DG Hip Unilat W or Wo Pelvis 2-3 Views Right  Result Date: 07/29/2021 CLINICAL DATA:  Feeling bad for a couple of days. EXAM: DG HIP (WITH OR WITHOUT PELVIS) 2-3V RIGHT COMPARISON:  None. FINDINGS: There is no evidence of hip fracture or dislocation. Mild degenerative changes seen in the form of joint space narrowing and acetabular sclerosis. There is moderate to marked severity vascular calcification. IMPRESSION: No acute osseous abnormality. Electronically Signed   By: Virgina Norfolk M.D.   On: 07/29/2021 22:26    EKG: I have personally reviewed EKG: Sinus rhythm, LVH, QTc 504.  No acute ischemic changes.  QT interval slightly increased since prior tracing from December 2022.  Assessment/Plan Principal Problem:   Syncope Active Problems:   Hypertensive urgency   Insulin dependent type 2 diabetes mellitus (HCC)   CAD (coronary artery disease)   CKD (chronic kidney disease), stage IV (HCC)   Chronic diastolic CHF (congestive heart failure) (HCC)    Assessment and Plan: * Syncope- (present on admission) Chest pain Elevated troponin QT prolongation Chest pain appears atypical, only when she touches her chest but not otherwise.  ACS less likely as troponin elevated but flat and EKG without acute ischemic changes.  Troponin elevation likely due to demand ischemia in the setting of elevated blood pressure.  There is concern for arrhythmia causing syncope as patient reports waking up with palpitations.  EKG showing sinus rhythm, QTc 504.  QT interval slightly increased since prior tracing from December 2022.  Potassium level is normal.  Chest x-ray negative for acute cardiopulmonary process.  CT  head negative for acute finding and no focal neurodeficit on exam. ?Seizure given urinary incontinence.  PE less likely given no tachycardia or hypoxia. -Cardiac monitoring, echocardiogram, carotid Dopplers, EEG, orthostatics.  Check magnesium level and keep above 4.  Avoid QT prolonging drugs and repeat EKG in a.m.  Addendum 2/28 3:54 AM: She does have a murmur on exam, best appreciated at the right upper sternal border second intercostal space.  Previous echo from July 2021 showing mild aortic stenosis.  Echocardiogram ordered to see if this has worsened.  Hypertensive urgency- (present on admission) Blood pressure now improved with systolic in the 185U after nitroglycerin ointment. -Hold home oral medications at this time and check orthostatics given syncope.  Hydralazine prn SBP >170.  Insulin dependent type 2 diabetes mellitus ([place]) Poorly controlled.  Blood glucose elevated in the 300s.  Bicarb slightly low in the setting of CKD stage IV with normal anion gap.  No ketones on UA. -Check A1c.  Sliding scale insulin sensitive ACHS.  Resume home basal insulin after pharmacy med rec is done.  Consult diabetes coordinator.   Chronic diastolic CHF (congestive heart failure) (HCC) No signs of volume overload. -Monitor volume status closely  CKD (chronic kidney disease), stage IV ([place])- (present on admission) History of renal transplant Mild metabolic acidosis Creatinine 2.8, stable.  On bicarb supplement at home. -Continue to monitor renal function and bicarb level.  Resume home meds after pharmacy med rec is done.  CAD (coronary artery disease) Status post CABG x3 Work-up  not suggestive of ACS. -Resume home meds after pharmacy med rec is done.   DVT prophylaxis: SQ Heparin Code Status: Full Code (discussed with the patient) Family Communication: No family available at this time. Admission status: Observation, Telemetry bed It is my clinical opinion that referral for OBSERVATION is  reasonable and necessary in this patient based on the above information provided. The aforementioned taken together are felt to place the patient at high risk for further clinical deterioration. However, it is anticipated that the patient may be medically stable for discharge from the hospital within 24 to 48 hours.   Shela Leff, MD [facility] 07/30/2021, 3:56 AM

## 2021-07-30 NOTE — ED Notes (Signed)
Nitro paste applied to rt. chest

## 2021-07-30 NOTE — ED Notes (Signed)
Report given to [facility] RN @ [address]

## 2021-07-30 NOTE — Assessment & Plan Note (Signed)
No signs of volume overload. -Monitor volume status closely

## 2021-07-30 NOTE — Procedures (Signed)
Patient Name: April Pena  MRN: [id number]  Epilepsy Attending: Lora Havens  Referring Physician/Provider: Shela Leff, MD Date: 07/30/2021 Duration: 27.10 mins  Patient history: 66yo F with syncope. EEG to evaluate for seizure  Level of alertness: Awake  AEDs during EEG study: None  Technical aspects: This EEG study was done with scalp electrodes positioned according to the 10-20 International system of electrode placement. Electrical activity was acquired at a sampling rate of 500Hz  and reviewed with a high frequency filter of 70Hz  and a low frequency filter of 1Hz . EEG data were recorded continuously and digitally stored.   Description: The posterior dominant rhythm consists of 8-9 Hz activity of moderate voltage (25-35 uV) seen predominantly in posterior head regions, symmetric and reactive to eye opening and eye closing. Physiologic photic driving was not seen during photic stimulation. Hyperventilation was not performed.     IMPRESSION: This study is within normal limits. No seizures or epileptiform discharges were seen throughout the recording.  Nyeisha Goodall Barbra Sarks

## 2021-07-30 NOTE — Progress Notes (Signed)
Inpatient Diabetes Program Recommendations  AACE/ADA: New Consensus Statement on Inpatient Glycemic Control (2015)  Target Ranges:  Prepandial:   less than 140 mg/dL      Peak postprandial:   less than 180 mg/dL (1-2 hours)      Critically ill patients:  140 - 180 mg/dL   Lab Results  Component Value Date   GLUCAP 176 (H) 07/30/2021   HGBA1C 7.9 (H) 07/30/2021    Review of Glycemic Control  Latest Reference Range & Units 07/29/21 21:02 07/30/21 06:05 07/30/21 11:26 07/30/21 11:41  Glucose-Capillary 70 - 99 mg/dL 290 (H) 262 (H) 177 (H) 176 (H)   Diabetes history: DM 2 Outpatient Diabetes medications:  Novolog 10-30 units tid with meals Levemir 5 units  Current orders for Inpatient glycemic control:  Novolog sensitive tid with meals and HS Inpatient Diabetes Program Recommendations:   Consider adding Levemir 10 units daily.   Thanks,  Adah Perl, RN, BC-ADM Inpatient Diabetes Coordinator Pager [phone]  (8a-5p)

## 2021-07-30 NOTE — Progress Notes (Addendum)
PROGRESS NOTE    April Pena  ION:[id number] DOB: 1955/08/13 DOA: 07/29/2021 PCP: Guadlupe Spanish, MD  [name] with history of CAD/CABG, chronic diastolic CHF, CKD 4, type 2 diabetes mellitus, history of renal transplant, hypertension, dyslipidemia presented to the ED after syncopal event. -Patient reports feeling weak last few days, had some diarrhea few days ago -Yesterday took her medicines, sat down to drink Ensure, and then woke up on the floor at 5 PM, blood sugar was in the 200s, she denied any shortness of breath nausea vomiting abdominal pain, fevers or chills, had some vague chest discomfort yesterday, since resolved -In the ED she was hypertensive, orthostatics were negative, hemoglobin stable, creatinine stable at 2.8, UA was unremarkable, EKG noted sinus rhythm, QTc of 504, troponin 86, 88, COVID and flu PCR were negative, x-rays of the chest, CT head and C-spine were unremarkable   Subjective: -Feels okay, denies any complaints  Assessment and Plan:  Syncope Elevated troponin QT prolongation -Orthostatics negative, no events on telemetry overnight, monitor today -She denies any chest pain, EKG is nonacute, high-sensitivity troponins are minimally elevated, do not suspect ACS -EKG with QTc of 504, will repeat -Potassium is normal, mag is 1.8,  will repeat -2D echo ordered, will follow up, resume Toprol -Physical therapy eval -If echo is stable will arrange close cardiology follow-up  Chronic diastolic CHF (congestive heart failure) (HCC) No signs of volume overload. -Monitor volume status closely -Restart Toprol  Uncontrolled hypertension -Resume amlodipine, imdur, Toprol, appears euvolemic  Insulin dependent type 2 diabetes mellitus ([name]) -Poorly controlled, hemoglobin A1c is 7.9, CBGs in the 200s, will increase basal insulin  CKD (chronic kidney disease), stage IV (HCC)- (present on admission) History of renal transplant Mild metabolic  acidosis -Baseline creatinine around 2.5-2.8, stable now -On bicarb supplement at home. -Restart transplant meds -Followed by nephrologist in [facility]  CAD (coronary artery disease) Status post CABG x3 Work-up not suggestive of ACS. -Resume home meds after pharmacy med rec is done.  DVT prophylaxis: Heparin subcutaneous Code Status: Full code Family Communication: Discussed patient in detail, no family at bedside Disposition Plan: Home later today or tomorrow  Consultants:    Procedures:   Antimicrobials:    Objective: Vitals:   07/30/21 0617 07/30/21 0743 07/30/21 1128 07/30/21 1143  BP: (!) 178/57 (!) 160/80 134/62 (!) 157/57  Pulse:  98 92   Resp:  18 16   Temp:  97.6 F (36.4 C) 98.9 F (37.2 C)   TempSrc:  Oral Oral   SpO2:  96% 91%   Weight:      Height:        Intake/Output Summary (Last 24 hours) at 07/30/2021 1148 Last data filed at 07/30/2021 0825 Gross per 24 hour  Intake 237 ml  Output --  Net 237 ml   Filed Weights   07/29/21 2055 07/30/21 0155  Weight: 82.6 kg 84.2 kg    Examination:  General exam: Appears calm and comfortable  Respiratory system: Clear to auscultation Cardiovascular system: S1 & S2 heard, RRR.  Abd: nondistended, soft and nontender.Normal bowel sounds heard. Central nervous system: Alert and oriented. No focal neurological deficits. Extremities: no edema Skin: No rashes Psychiatry: Judgement and insight appear normal. Mood & affect appropriate.     Data Reviewed:   CBC: Recent Labs  Lab 07/29/21 2107  WBC 9.3  HGB 11.3*  HCT 35.1*  MCV 86.2  PLT 244   Basic Metabolic Panel: Recent Labs  Lab 07/29/21 2107 07/30/21 0359  NA 136 140  K 4.6 5.0  CL 104 108  CO2 20* 22  GLUCOSE 330* 290*  BUN 62* 57*  CREATININE 2.86* 2.80*  CALCIUM 9.8 9.1  MG  --  1.8   GFR: Estimated Creatinine Clearance: 21 mL/min (A) (by C-G formula based on SCr of 2.8 mg/dL (H)). Liver Function Tests: No results for  input(s): AST, ALT, ALKPHOS, BILITOT, PROT, ALBUMIN in the last 168 hours. No results for input(s): LIPASE, AMYLASE in the last 168 hours. No results for input(s): AMMONIA in the last 168 hours. Coagulation Profile: No results for input(s): INR, PROTIME in the last 168 hours. Cardiac Enzymes: Recent Labs  Lab 07/29/21 2107  CKTOTAL 113   BNP (last 3 results) No results for input(s): PROBNP in the last 8760 hours. HbA1C: Recent Labs    07/30/21 0359  HGBA1C 7.9*   CBG: Recent Labs  Lab 07/29/21 2102 07/30/21 0605 07/30/21 1126 07/30/21 1141  GLUCAP 290* 262* 177* 176*   Lipid Profile: No results for input(s): CHOL, HDL, LDLCALC, TRIG, CHOLHDL, LDLDIRECT in the last 72 hours. Thyroid Function Tests: No results for input(s): TSH, T4TOTAL, FREET4, T3FREE, THYROIDAB in the last 72 hours. Anemia Panel: No results for input(s): VITAMINB12, FOLATE, FERRITIN, TIBC, IRON, RETICCTPCT in the last 72 hours. Urine analysis:    Component Value Date/Time   COLORURINE YELLOW 07/29/2021 2107   APPEARANCEUR CLEAR 07/29/2021 2107   LABSPEC 1.020 07/29/2021 2107   PHURINE 6.5 07/29/2021 2107   GLUCOSEU 250 (A) 07/29/2021 2107   HGBUR NEGATIVE 07/29/2021 2107   [place] NEGATIVE 07/29/2021 2107   [place] NEGATIVE 07/29/2021 2107   PROTEINUR 100 (A) 07/29/2021 2107   UROBILINOGEN 0.2 02/26/2009 2001   NITRITE NEGATIVE 07/29/2021 2107   LEUKOCYTESUR NEGATIVE 07/29/2021 2107   Sepsis Labs: @LABRCNTIP (procalcitonin:4,lacticidven:4)  ) Recent Results (from the past 240 hour(s))  Resp Panel by RT-PCR (Flu A&B, Covid) Nasopharyngeal Swab     Status: None   Collection Time: 07/29/21  9:11 PM   Specimen: Nasopharyngeal Swab; Nasopharyngeal(NP) swabs in vial transport medium  Result Value Ref Range Status   SARS Coronavirus 2 by RT PCR NEGATIVE NEGATIVE Final    Comment: (NOTE) SARS-CoV-2 target nucleic acids are NOT DETECTED.  The SARS-CoV-2 RNA is generally detectable in upper  respiratory specimens during the acute phase of infection. The lowest concentration of SARS-CoV-2 viral copies this assay can detect is 138 copies/mL. A negative result does not preclude SARS-Cov-2 infection and should not be used as the sole basis for treatment or other patient management decisions. A negative result may occur with  improper specimen collection/handling, submission of specimen other than nasopharyngeal swab, presence of viral mutation(s) within the areas targeted by this assay, and inadequate number of viral copies(<138 copies/mL). A negative result must be combined with clinical observations, patient history, and epidemiological information. The expected result is Negative.  Fact Sheet for Patients:  EntrepreneurPulse.com.au  Fact Sheet for Healthcare Providers:  IncredibleEmployment.be  This test is no t yet approved or cleared by the [place] FDA and  has been authorized for detection and/or diagnosis of SARS-CoV-2 by FDA under an Emergency Use Authorization (EUA). This EUA will remain  in effect (meaning this test can be used) for the duration of the COVID-19 declaration under Section 564(b)(1) of the Act, 21 U.S.C.section 360bbb-3(b)(1), unless the authorization is terminated  or revoked sooner.       Influenza A by PCR NEGATIVE NEGATIVE Final   Influenza B by PCR NEGATIVE NEGATIVE Final  Comment: (NOTE) The Xpert Xpress SARS-CoV-2/FLU/RSV plus assay is intended as an aid in the diagnosis of influenza from Nasopharyngeal swab specimens and should not be used as a sole basis for treatment. Nasal washings and aspirates are unacceptable for Xpert Xpress SARS-CoV-2/FLU/RSV testing.  Fact Sheet for Patients: EntrepreneurPulse.com.au  Fact Sheet for Healthcare Providers: IncredibleEmployment.be  This test is not yet approved or cleared by the [place] FDA and has been  authorized for detection and/or diagnosis of SARS-CoV-2 by FDA under an Emergency Use Authorization (EUA). This EUA will remain in effect (meaning this test can be used) for the duration of the COVID-19 declaration under Section 564(b)(1) of the Act, 21 U.S.C. section 360bbb-3(b)(1), unless the authorization is terminated or revoked.  Performed at [facility], [address], [place], [place] [address]      Radiology Studies: DG Chest 1 View  Result Date: 07/29/2021 CLINICAL DATA:  Diffuse pain and generalized malaise. EXAM: CHEST  1 VIEW COMPARISON:  August 23, 2020 FINDINGS: Multiple sternal wires are seen. There is mild to moderate severity enlargement of the cardiac silhouette which is unchanged in severity. Moderate severity diffuse calcification of the thoracic aorta is seen. Both lungs are clear. The visualized skeletal structures are unremarkable. IMPRESSION: 1. Stable cardiomegaly. 2. No acute or active cardiopulmonary disease. Electronically Signed   By: Virgina Norfolk M.D.   On: 07/29/2021 22:25   CT Head Wo Contrast  Result Date: 07/29/2021 CLINICAL DATA:  Mental status changes of unknown cause. EXAM: CT HEAD WITHOUT CONTRAST TECHNIQUE: Contiguous axial images were obtained from the base of the skull through the vertex without intravenous contrast. RADIATION DOSE REDUCTION: This exam was performed according to the departmental dose-optimization program which includes automated exposure control, adjustment of the mA and/or kV according to patient size and/or use of iterative reconstruction technique. COMPARISON:  12/19/2020 FINDINGS: Brain: Age related volume loss. Chronic small-vessel ischemic changes of the white matter. No evidence of acute infarction, mass lesion, hemorrhage, hydrocephalus or extra-axial collection. Vascular: Right carotid stent is visible. There is atherosclerotic calcification of the major vessels at the base of the brain. Skull: Normal  Sinuses/Orbits: Clear sinuses.  Bilateral orbital silastic implants. Other: None IMPRESSION: No acute intracranial finding. Chronic small-vessel ischemic changes of the cerebral hemispheric white matter. Electronically Signed   By: Nelson Chimes M.D.   On: 07/29/2021 22:22   CT Cervical Spine Wo Contrast  Result Date: 07/29/2021 CLINICAL DATA:  [place] today.  Neck trauma. EXAM: CT CERVICAL SPINE WITHOUT CONTRAST TECHNIQUE: Multidetector CT imaging of the cervical spine was performed without intravenous contrast. Multiplanar CT image reconstructions were also generated. RADIATION DOSE REDUCTION: This exam was performed according to the departmental dose-optimization program which includes automated exposure control, adjustment of the mA and/or kV according to patient size and/or use of iterative reconstruction technique. COMPARISON:  05/03/2013 FINDINGS: Alignment: No traumatic malalignment. Skull base and vertebrae: No regional fracture. Soft tissues and spinal canal: No traumatic soft tissue finding. Disc levels: Mild cervical spondylosis C5-6 but without significant canal or foraminal narrowing. Upper chest: Negative Other: Atherosclerotic calcification affecting the arterial structures in the neck. IMPRESSION: No acute or traumatic finding. Minimal mid cervical degenerative changes. Electronically Signed   By: Nelson Chimes M.D.   On: 07/29/2021 22:23   EEG adult  Result Date: 07/30/2021 Lora Havens, MD     07/30/2021  8:39 AM Patient Name: April Pena MRN: [id number] Epilepsy Attending: Lora Havens Referring Physician/Provider: Shela Leff, MD Date:  07/30/2021 Duration: 27.10 mins Patient history: 66yo F with syncope. EEG to evaluate for seizure Level of alertness: Awake AEDs during EEG study: None Technical aspects: This EEG study was done with scalp electrodes positioned according to the 10-20 International system of electrode placement. Electrical activity was acquired at a sampling rate  of 500Hz  and reviewed with a high frequency filter of 70Hz  and a low frequency filter of 1Hz . EEG data were recorded continuously and digitally stored. Description: The posterior dominant rhythm consists of 8-9 Hz activity of moderate voltage (25-35 uV) seen predominantly in posterior head regions, symmetric and reactive to eye opening and eye closing. Physiologic photic driving was not seen during photic stimulation. Hyperventilation was not performed.   IMPRESSION: This study is within normal limits. No seizures or epileptiform discharges were seen throughout the recording. Lora Havens   DG Hip Unilat W or Wo Pelvis 2-3 Views Right  Result Date: 07/29/2021 CLINICAL DATA:  Feeling bad for a couple of days. EXAM: DG HIP (WITH OR WITHOUT PELVIS) 2-3V RIGHT COMPARISON:  None. FINDINGS: There is no evidence of hip fracture or dislocation. Mild degenerative changes seen in the form of joint space narrowing and acetabular sclerosis. There is moderate to marked severity vascular calcification. IMPRESSION: No acute osseous abnormality. Electronically Signed   By: Virgina Norfolk M.D.   On: 07/29/2021 22:26   VAS US CAROTID  Result Date: 07/30/2021 Carotid Arterial Duplex Study Patient Name:  BARBARITA HUTMACHER  Date of Exam:   07/30/2021 Medical Rec #: [id number]      Accession #:    [id number] Date of Birth: 30-Aug-1955     Patient Gender: F Patient Age:   20 years Exam Location:  [facility] Procedure:      VAS US CAROTID Referring Phys: Wandra Feinstein RATHORE --------------------------------------------------------------------------------  Indications:       Syncope. Risk Factors:      Hypertension, hyperlipidemia, Diabetes, past history of                    smoking, prior MI, coronary artery disease. Comparison Study:  No prior studies. Performing Technologist: Darlin Coco RDMS, RVT  Examination Guidelines: A complete evaluation includes B-mode imaging, spectral Doppler, color Doppler, and power Doppler as  needed of all accessible portions of each vessel. Bilateral testing is considered an integral part of a complete examination. Limited examinations for reoccurring indications may be performed as noted.  Right Carotid Findings: +----------+--------+--------+--------+------------------+--------+             PSV cm/s EDV cm/s Stenosis Plaque Description Comments  +----------+--------+--------+--------+------------------+--------+  CCA Prox   81       9                                              +----------+--------+--------+--------+------------------+--------+  CCA Distal 79       9                                              +----------+--------+--------+--------+------------------+--------+  ICA Prox   64       9                 heterogenous                 +----------+--------+--------+--------+------------------+--------+  ICA Distal 58       15                                             +----------+--------+--------+--------+------------------+--------+  ECA        154                        calcific                     +----------+--------+--------+--------+------------------+--------+ +----------+--------+-------+----------------+-------------------+             PSV cm/s EDV cms Describe         Arm Pressure (mmHG)  +----------+--------+-------+----------------+-------------------+  Subclavian 94               Multiphasic, WNL                      +----------+--------+-------+----------------+-------------------+ +---------+--------+--+--------+--+---------+  Vertebral PSV cm/s 49 EDV cm/s 11 Antegrade  +---------+--------+--+--------+--+---------+  Left Carotid Findings: +----------+--------+--------+--------+----------------------------+--------+             PSV cm/s EDV cm/s Stenosis Plaque Description           Comments  +----------+--------+--------+--------+----------------------------+--------+  CCA Prox   104      17                                                        +----------+--------+--------+--------+----------------------------+--------+  CCA Distal 87       14                heterogenous                           +----------+--------+--------+--------+----------------------------+--------+  ICA Prox   125      27                hyperechoic and heterogenous           +----------+--------+--------+--------+----------------------------+--------+  ICA Distal 70       18                                                       +----------+--------+--------+--------+----------------------------+--------+  ECA        102      0                                                        +----------+--------+--------+--------+----------------------------+--------+ +----------+--------+--------+---------+-------------------+             PSV cm/s EDV cm/s Describe  Arm Pressure (mmHG)  +----------+--------+--------+---------+-------------------+  Subclavian 181               Turbulent                      +----------+--------+--------+---------+-------------------+ +---------+--------+--+--------+-+---------+  Vertebral PSV  cm/s 52 EDV cm/s 9 Antegrade  +---------+--------+--+--------+-+---------+   Summary: Right Carotid: Velocities in the right ICA are consistent with a 1-39% stenosis.                The ECA appears <50% stenosed. Left Carotid: Velocities in the left ICA are consistent with a 1-39% stenosis.               The ECA appears <50% stenosed. Vertebrals:  Bilateral vertebral arteries demonstrate antegrade flow. Subclavians: Left subclavian artery flow was disturbed. Normal flow hemodynamics              were seen in the right subclavian artery. *See table(s) above for measurements and observations.     Preliminary      Scheduled Meds:  amLODipine  10 mg Oral Daily   heparin  5,000 Units Subcutaneous Q8H   insulin aspart  0-5 Units Subcutaneous QHS   insulin aspart  0-9 Units Subcutaneous TID WC   isosorbide mononitrate  30 mg Oral Once   [START ON 07/31/2021] isosorbide  mononitrate  60 mg Oral Daily   Continuous Infusions:   LOS: 0 days    Time spent: 16min    Domenic Polite, MD [facility]   07/30/2021, 11:48 AM

## 2021-07-30 NOTE — ED Notes (Signed)
[facility] here to transport pt 

## 2021-07-30 NOTE — Progress Notes (Signed)
EEG complete - results pending 

## 2021-07-30 NOTE — Assessment & Plan Note (Addendum)
Status post CABG x3 Work-up not suggestive of ACS. -Resume home meds after pharmacy med rec is done.

## 2021-07-30 NOTE — TOC Progression Note (Signed)
Transition of Care [facility]) - Progression Note    Patient Details  Name: April Pena MRN: [id number] Date of Birth: 25-Jan-1956  Transition of Care [facility]) CM/SW Contact  Zenon Mayo, RN Phone Number: 07/30/2021, 12:15 PM  Clinical Narrative:    from home,  syncope, ckd4 , for carotid u/s, echo today, had st elevation this am.  Plan for dc today or tomorrow. TOC will continue to follow for dc needs.        Expected Discharge Plan and Services                                                 Social Determinants of Health (SDOH) Interventions    Readmission Risk Interventions No flowsheet data found.

## 2021-07-30 NOTE — Assessment & Plan Note (Signed)
Likely due to demand ischemia in the setting of elevated blood pressure.  ACS less likely as troponin elevated but flat and EKG without acute ischemic changes.  Patient denies chest pain.

## 2021-07-31 DIAGNOSIS — Z7982 Long term (current) use of aspirin: Secondary | ICD-10-CM | POA: Diagnosis not present

## 2021-07-31 DIAGNOSIS — N184 Chronic kidney disease, stage 4 (severe): Secondary | ICD-10-CM | POA: Diagnosis present

## 2021-07-31 DIAGNOSIS — Z7952 Long term (current) use of systemic steroids: Secondary | ICD-10-CM | POA: Diagnosis not present

## 2021-07-31 DIAGNOSIS — E1122 Type 2 diabetes mellitus with diabetic chronic kidney disease: Secondary | ICD-10-CM | POA: Diagnosis present

## 2021-07-31 DIAGNOSIS — Z833 Family history of diabetes mellitus: Secondary | ICD-10-CM | POA: Diagnosis not present

## 2021-07-31 DIAGNOSIS — Z20822 Contact with and (suspected) exposure to covid-19: Secondary | ICD-10-CM | POA: Diagnosis present

## 2021-07-31 DIAGNOSIS — I130 Hypertensive heart and chronic kidney disease with heart failure and stage 1 through stage 4 chronic kidney disease, or unspecified chronic kidney disease: Secondary | ICD-10-CM | POA: Diagnosis present

## 2021-07-31 DIAGNOSIS — Z8249 Family history of ischemic heart disease and other diseases of the circulatory system: Secondary | ICD-10-CM | POA: Diagnosis not present

## 2021-07-31 DIAGNOSIS — I160 Hypertensive urgency: Secondary | ICD-10-CM | POA: Diagnosis present

## 2021-07-31 DIAGNOSIS — Z794 Long term (current) use of insulin: Secondary | ICD-10-CM | POA: Diagnosis not present

## 2021-07-31 DIAGNOSIS — I2510 Atherosclerotic heart disease of native coronary artery without angina pectoris: Secondary | ICD-10-CM | POA: Diagnosis present

## 2021-07-31 DIAGNOSIS — Y830 Surgical operation with transplant of whole organ as the cause of abnormal reaction of the patient, or of later complication, without mention of misadventure at the time of the procedure: Secondary | ICD-10-CM | POA: Diagnosis present

## 2021-07-31 DIAGNOSIS — Z940 Kidney transplant status: Secondary | ICD-10-CM | POA: Diagnosis not present

## 2021-07-31 DIAGNOSIS — I5032 Chronic diastolic (congestive) heart failure: Secondary | ICD-10-CM | POA: Diagnosis present

## 2021-07-31 DIAGNOSIS — N179 Acute kidney failure, unspecified: Secondary | ICD-10-CM | POA: Diagnosis present

## 2021-07-31 DIAGNOSIS — E1165 Type 2 diabetes mellitus with hyperglycemia: Secondary | ICD-10-CM | POA: Diagnosis present

## 2021-07-31 DIAGNOSIS — Z951 Presence of aortocoronary bypass graft: Secondary | ICD-10-CM | POA: Diagnosis not present

## 2021-07-31 DIAGNOSIS — I248 Other forms of acute ischemic heart disease: Secondary | ICD-10-CM | POA: Diagnosis present

## 2021-07-31 DIAGNOSIS — E785 Hyperlipidemia, unspecified: Secondary | ICD-10-CM | POA: Diagnosis present

## 2021-07-31 DIAGNOSIS — E872 Acidosis, unspecified: Secondary | ICD-10-CM | POA: Diagnosis present

## 2021-07-31 DIAGNOSIS — Z79899 Other long term (current) drug therapy: Secondary | ICD-10-CM | POA: Diagnosis not present

## 2021-07-31 DIAGNOSIS — Z87891 Personal history of nicotine dependence: Secondary | ICD-10-CM | POA: Diagnosis not present

## 2021-07-31 DIAGNOSIS — I252 Old myocardial infarction: Secondary | ICD-10-CM | POA: Diagnosis not present

## 2021-07-31 DIAGNOSIS — T8619 Other complication of kidney transplant: Secondary | ICD-10-CM | POA: Diagnosis present

## 2021-07-31 DIAGNOSIS — R55 Syncope and collapse: Secondary | ICD-10-CM | POA: Diagnosis present

## 2021-07-31 LAB — BASIC METABOLIC PANEL WITH GFR
Anion gap: 9 (ref 5–15)
BUN: 49 mg/dL — ABNORMAL HIGH (ref 8–23)
CO2: 20 mmol/L — ABNORMAL LOW (ref 22–32)
Calcium: 9.2 mg/dL (ref 8.9–10.3)
Chloride: 106 mmol/L (ref 98–111)
Creatinine, Ser: 3.08 mg/dL — ABNORMAL HIGH (ref 0.44–1.00)
GFR, Estimated: 16 mL/min — ABNORMAL LOW
Glucose, Bld: 130 mg/dL — ABNORMAL HIGH (ref 70–99)
Potassium: 4.2 mmol/L (ref 3.5–5.1)
Sodium: 135 mmol/L (ref 135–145)

## 2021-07-31 LAB — GLUCOSE, CAPILLARY
Glucose-Capillary: 126 mg/dL — ABNORMAL HIGH (ref 70–99)
Glucose-Capillary: 253 mg/dL — ABNORMAL HIGH (ref 70–99)
Glucose-Capillary: 146 mg/dL — ABNORMAL HIGH (ref 70–99)
Glucose-Capillary: 149 mg/dL — ABNORMAL HIGH (ref 70–99)

## 2021-07-31 MED ORDER — LOPERAMIDE HCL 2 MG PO CAPS
2.0000 mg | ORAL_CAPSULE | Freq: Once | ORAL | Status: AC
  Administered 2021-07-31: 2 mg via ORAL
  Filled 2021-07-31: qty 1

## 2021-07-31 MED ORDER — INSULIN DETEMIR 100 UNIT/ML ~~LOC~~ SOLN
5.0000 [IU] | Freq: Every day | SUBCUTANEOUS | Status: DC
  Filled 2021-07-31 (×2): qty 0.05

## 2021-07-31 MED ORDER — INSULIN ASPART 100 UNIT/ML IJ SOLN
2.0000 [IU] | Freq: Three times a day (TID) | INTRAMUSCULAR | Status: DC
  Administered 2021-07-31 – 2021-08-01 (×2): 2 [IU] via SUBCUTANEOUS

## 2021-07-31 NOTE — Care Management Obs Status (Signed)
[place] NOTIFICATION   Patient Details  Name: Korinna Tat MRN: [id number] Date of Birth: Nov 16, 1955   Medicare Observation Status Notification Given:  Yes    Zenon Mayo, RN 07/31/2021, 10:33 AM

## 2021-07-31 NOTE — Progress Notes (Signed)
Mobility Specialist Progress Note:   07/31/21 1020  Mobility  Activity Ambulated with assistance in hallway  Level of Assistance Standby assist, set-up cues, supervision of patient - no hands on  Assistive Device Four wheel walker  Distance Ambulated (ft) 400 ft  Activity Response Tolerated well  $Mobility charge 1 Mobility   Pt eager for OOB mobility this am. No physical assist required. Pt c/o minor SOB, which is baseline. Pt back sitting EOB with all needs met.   Nelta Numbers Acute Rehab Phone: [phone] Office Phone: [phone]

## 2021-07-31 NOTE — Progress Notes (Signed)
PROGRESS NOTE    April Pena  IZT:[id number] DOB: 07-26-1955 DOA: 07/29/2021 PCP: April Spanish, MD  [name] with history of CAD/CABG, chronic diastolic CHF, CKD 4, type 2 diabetes mellitus, history of renal transplant, hypertension, dyslipidemia presented to the ED after syncopal event. -Patient reports feeling weak last few days, had some diarrhea few days ago -2/19 took her medicines, sat down to drink Ensure, and then woke up on the floor at 5 PM, blood sugar was in the 200s, she denied any shortness of breath nausea vomiting abdominal pain, fevers or chills, had some vague chest discomfort yesterday, since resolved -In the ED she was hypertensive, orthostatics were negative, hemoglobin stable, creatinine stable at 2.8, UA was unremarkable, EKG noted sinus rhythm, QTc of 504, troponin 86, 88, COVID and flu PCR were negative, x-rays of the chest, CT head and C-spine were unremarkable   Subjective: -Feels okay, blood pressure remains elevated, as high as 180s overnight, patient is extremely reluctant and refuses to add an additional antihypertensives today, I called her daughter from the room to explain these concerns  Assessment and Plan:  Syncope Elevated troponin QT prolongation -Orthostatics negative, no events on telemetry so far -She denies any chest pain, EKG is nonacute, high-sensitivity troponins are minimally elevated, do not suspect ACS -EKG with QTc of 504, repeat improved at 480 -Electrolytes okay -2D echo with preserved EF, normal wall motion, grade 1 diastolic dysfunction, resumed Toprol -EEG was normal as well -Physical therapy eval completed, home health services recommended  Chronic diastolic CHF (congestive heart failure) (HCC) No signs of volume overload. -Resume Lasix 40 Mg daily and Toprol  Uncontrolled hypertension -Blood pressure remains poorly controlled, on max doses of amlodipine and Imdur, Toprol resumed -Recommended adding hydralazine,  patient does not agreeable, will reassess this afternoon  Insulin dependent type 2 diabetes mellitus (HCC) -Poorly controlled, hemoglobin A1c is 7.9, CBGs in the 200s, restart Levemir, add meal coverage  CKD (chronic kidney disease), stage IV ([place])- (present on admission) History of renal transplant Mild metabolic acidosis -Baseline creatinine around 2.5-2.8, creatinine 3.0, relatively stable -On bicarb supplement at home. -Home transplant meds resumed -Followed by nephrologist in [facility]  CAD (coronary artery disease) Status post CABG x3 Work-up not suggestive of ACS. -Continue Toprol  DVT prophylaxis: Heparin subcutaneous Code Status: Full code Family Communication: Discussed patient in detail, called daughter from patient's room Disposition Plan: Home later today or tomorrow  Consultants:    Procedures:   Antimicrobials:    Objective: Vitals:   07/31/21 0605 07/31/21 0844 07/31/21 0855 07/31/21 1049  BP: (!) 165/82 (!) 176/77 (!) 176/77 (!) 154/75  Pulse:   86 75  Resp:    17  Temp: 98.9 F (37.2 C)   98.3 F (36.8 C)  TempSrc: Oral   Oral  SpO2: 95%   94%  Weight:      Height:        Intake/Output Summary (Last 24 hours) at 07/31/2021 1323 Last data filed at 07/31/2021 1257 Gross per 24 hour  Intake 1860.18 ml  Output --  Net 1860.18 ml   Filed Weights   07/29/21 2055 07/30/21 0155 07/31/21 0159  Weight: 82.6 kg 84.2 kg 83.3 kg    Examination:  General exam: Chronically ill female appears much older than stated age, AAOx3 HEENT: Left eye with corneal damage CVS: S1-S2, regular rate rhythm Lungs: Clear bilaterally Abdomen: Soft, nontender, bowel sounds present Extremities: No edema  Skin: No rashes Psychiatry: Judgement and insight appear normal.  Mood & affect appropriate.   Data Reviewed:   CBC: Recent Labs  Lab 07/29/21 2107  WBC 9.3  HGB 11.3*  HCT 35.1*  MCV 86.2  PLT 650   Basic Metabolic Panel: Recent Labs  Lab  07/29/21 2107 07/30/21 0359 07/31/21 0716  NA 136 140 135  K 4.6 5.0 4.2  CL 104 108 106  CO2 20* 22 20*  GLUCOSE 330* 290* 130*  BUN 62* 57* 49*  CREATININE 2.86* 2.80* 3.08*  CALCIUM 9.8 9.1 9.2  MG  --  1.8  --    GFR: Estimated Creatinine Clearance: 19 mL/min (A) (by C-G formula based on SCr of 3.08 mg/dL (H)). Liver Function Tests: No results for input(s): AST, ALT, ALKPHOS, BILITOT, PROT, ALBUMIN in the last 168 hours. No results for input(s): LIPASE, AMYLASE in the last 168 hours. No results for input(s): AMMONIA in the last 168 hours. Coagulation Profile: No results for input(s): INR, PROTIME in the last 168 hours. Cardiac Enzymes: Recent Labs  Lab 07/29/21 2107  CKTOTAL 113   BNP (last 3 results) No results for input(s): PROBNP in the last 8760 hours. HbA1C: Recent Labs    07/30/21 0359  HGBA1C 7.9*   CBG: Recent Labs  Lab 07/30/21 1141 07/30/21 1555 07/30/21 2043 07/31/21 0607 07/31/21 1045  GLUCAP 176* 169* 139* 126* 253*   Lipid Profile: No results for input(s): CHOL, HDL, LDLCALC, TRIG, CHOLHDL, LDLDIRECT in the last 72 hours. Thyroid Function Tests: No results for input(s): TSH, T4TOTAL, FREET4, T3FREE, THYROIDAB in the last 72 hours. Anemia Panel: No results for input(s): VITAMINB12, FOLATE, FERRITIN, TIBC, IRON, RETICCTPCT in the last 72 hours. Urine analysis:    Component Value Date/Time   COLORURINE YELLOW 07/29/2021 2107   APPEARANCEUR CLEAR 07/29/2021 2107   LABSPEC 1.020 07/29/2021 2107   PHURINE 6.5 07/29/2021 2107   GLUCOSEU 250 (A) 07/29/2021 2107   HGBUR NEGATIVE 07/29/2021 2107   [place] NEGATIVE 07/29/2021 2107   [place] NEGATIVE 07/29/2021 2107   PROTEINUR 100 (A) 07/29/2021 2107   UROBILINOGEN 0.2 02/26/2009 2001   NITRITE NEGATIVE 07/29/2021 2107   LEUKOCYTESUR NEGATIVE 07/29/2021 2107   Sepsis Labs: @LABRCNTIP (procalcitonin:4,lacticidven:4)  ) Recent Results (from the past 240 hour(s))  Resp Panel by RT-PCR  (Flu A&B, Covid) Nasopharyngeal Swab     Status: None   Collection Time: 07/29/21  9:11 PM   Specimen: Nasopharyngeal Swab; Nasopharyngeal(NP) swabs in vial transport medium  Result Value Ref Range Status   SARS Coronavirus 2 by RT PCR NEGATIVE NEGATIVE Final    Comment: (NOTE) SARS-CoV-2 target nucleic acids are NOT DETECTED.  The SARS-CoV-2 RNA is generally detectable in upper respiratory specimens during the acute phase of infection. The lowest concentration of SARS-CoV-2 viral copies this assay can detect is 138 copies/mL. A negative result does not preclude SARS-Cov-2 infection and should not be used as the sole basis for treatment or other patient management decisions. A negative result may occur with  improper specimen collection/handling, submission of specimen other than nasopharyngeal swab, presence of viral mutation(s) within the areas targeted by this assay, and inadequate number of viral copies(<138 copies/mL). A negative result must be combined with clinical observations, patient history, and epidemiological information. The expected result is Negative.  Fact Sheet for Patients:  EntrepreneurPulse.com.au  Fact Sheet for Healthcare Providers:  IncredibleEmployment.be  This test is no t yet approved or cleared by the [place] FDA and  has been authorized for detection and/or diagnosis of SARS-CoV-2 by FDA under an Emergency Use  Authorization (EUA). This EUA will remain  in effect (meaning this test can be used) for the duration of the COVID-19 declaration under Section 564(b)(1) of the Act, 21 U.S.C.section 360bbb-3(b)(1), unless the authorization is terminated  or revoked sooner.       Influenza A by PCR NEGATIVE NEGATIVE Final   Influenza B by PCR NEGATIVE NEGATIVE Final    Comment: (NOTE) The Xpert Xpress SARS-CoV-2/FLU/RSV plus assay is intended as an aid in the diagnosis of influenza from Nasopharyngeal swab specimens  and should not be used as a sole basis for treatment. Nasal washings and aspirates are unacceptable for Xpert Xpress SARS-CoV-2/FLU/RSV testing.  Fact Sheet for Patients: EntrepreneurPulse.com.au  Fact Sheet for Healthcare Providers: IncredibleEmployment.be  This test is not yet approved or cleared by the [place] FDA and has been authorized for detection and/or diagnosis of SARS-CoV-2 by FDA under an Emergency Use Authorization (EUA). This EUA will remain in effect (meaning this test can be used) for the duration of the COVID-19 declaration under Section 564(b)(1) of the Act, 21 U.S.C. section 360bbb-3(b)(1), unless the authorization is terminated or revoked.  Performed at [facility], [address] [place], [place] [address]      Radiology Studies: DG Chest 1 View  Result Date: 07/29/2021 CLINICAL DATA:  Diffuse pain and generalized malaise. EXAM: CHEST  1 VIEW COMPARISON:  August 23, 2020 FINDINGS: Multiple sternal wires are seen. There is mild to moderate severity enlargement of the cardiac silhouette which is unchanged in severity. Moderate severity diffuse calcification of the thoracic aorta is seen. Both lungs are clear. The visualized skeletal structures are unremarkable. IMPRESSION: 1. Stable cardiomegaly. 2. No acute or active cardiopulmonary disease. Electronically Signed   By: Virgina Norfolk M.D.   On: 07/29/2021 22:25   CT Head Wo Contrast  Result Date: 07/29/2021 CLINICAL DATA:  Mental status changes of unknown cause. EXAM: CT HEAD WITHOUT CONTRAST TECHNIQUE: Contiguous axial images were obtained from the base of the skull through the vertex without intravenous contrast. RADIATION DOSE REDUCTION: This exam was performed according to the departmental dose-optimization program which includes automated exposure control, adjustment of the mA and/or kV according to patient size and/or use of iterative reconstruction  technique. COMPARISON:  12/19/2020 FINDINGS: Brain: Age related volume loss. Chronic small-vessel ischemic changes of the white matter. No evidence of acute infarction, mass lesion, hemorrhage, hydrocephalus or extra-axial collection. Vascular: Right carotid stent is visible. There is atherosclerotic calcification of the major vessels at the base of the brain. Skull: Normal Sinuses/Orbits: Clear sinuses.  Bilateral orbital silastic implants. Other: None IMPRESSION: No acute intracranial finding. Chronic small-vessel ischemic changes of the cerebral hemispheric white matter. Electronically Signed   By: Nelson Chimes M.D.   On: 07/29/2021 22:22   CT Cervical Spine Wo Contrast  Result Date: 07/29/2021 CLINICAL DATA:  [place] today.  Neck trauma. EXAM: CT CERVICAL SPINE WITHOUT CONTRAST TECHNIQUE: Multidetector CT imaging of the cervical spine was performed without intravenous contrast. Multiplanar CT image reconstructions were also generated. RADIATION DOSE REDUCTION: This exam was performed according to the departmental dose-optimization program which includes automated exposure control, adjustment of the mA and/or kV according to patient size and/or use of iterative reconstruction technique. COMPARISON:  05/03/2013 FINDINGS: Alignment: No traumatic malalignment. Skull base and vertebrae: No regional fracture. Soft tissues and spinal canal: No traumatic soft tissue finding. Disc levels: Mild cervical spondylosis C5-6 but without significant canal or foraminal narrowing. Upper chest: Negative Other: Atherosclerotic calcification affecting the arterial structures  in the neck. IMPRESSION: No acute or traumatic finding. Minimal mid cervical degenerative changes. Electronically Signed   By: Nelson Chimes M.D.   On: 07/29/2021 22:23   EEG adult  Result Date: 07/30/2021 Lora Havens, MD     07/30/2021  8:39 AM Patient Name: April Pena MRN: [id number] Epilepsy Attending: Lora Havens Referring Physician/Provider:  Shela Leff, MD Date: 07/30/2021 Duration: 27.10 mins Patient history: 65yo F with syncope. EEG to evaluate for seizure Level of alertness: Awake AEDs during EEG study: None Technical aspects: This EEG study was done with scalp electrodes positioned according to the 10-20 International system of electrode placement. Electrical activity was acquired at a sampling rate of 500Hz  and reviewed with a high frequency filter of 70Hz  and a low frequency filter of 1Hz . EEG data were recorded continuously and digitally stored. Description: The posterior dominant rhythm consists of 8-9 Hz activity of moderate voltage (25-35 uV) seen predominantly in posterior head regions, symmetric and reactive to eye opening and eye closing. Physiologic photic driving was not seen during photic stimulation. Hyperventilation was not performed.   IMPRESSION: This study is within normal limits. No seizures or epileptiform discharges were seen throughout the recording. Lora Havens   ECHOCARDIOGRAM COMPLETE  Result Date: 07/30/2021    ECHOCARDIOGRAM REPORT   Patient Name:   April Pena Date of Exam: 07/30/2021 Medical Rec #:  [id number]     Height:       64.0 in Accession #:    [id number]    Weight:       185.7 lb Date of Birth:  [birth year]    BSA:          1.896 m Patient Age:    89 years      BP:           160/80 mmHg Patient Gender: F             HR:           93 bpm. Exam Location:  Inpatient Procedure: 2D Echo, Color Doppler and Cardiac Doppler Indications:    R55 Syncope  History:        Patient has prior history of Echocardiogram examinations, most                 recent 08/04/2020. CHF, Prior CABG, Pulmonary HTN; Risk                 Factors:Hypertension and Diabetes.  Sonographer:    Raquel Sarna Senior RDCS Referring Phys: [id number] [place]  1. Left ventricular ejection fraction, by estimation, is 60 to 65%. The left ventricle has normal function. The left ventricle has no regional wall motion abnormalities.  There is mild left ventricular hypertrophy. Left ventricular diastolic parameters are consistent with Grade I diastolic dysfunction (impaired relaxation). Elevated left ventricular end-diastolic pressure.  2. Right ventricular systolic function is normal. The right ventricular size is normal. There is normal pulmonary artery systolic pressure.  3. The mitral valve is grossly normal. Mild mitral valve regurgitation. Moderate mitral annular calcification.  4. The aortic valve is normal in structure. Aortic valve regurgitation is not visualized. Mild aortic valve stenosis. FINDINGS  Left Ventricle: Left ventricular ejection fraction, by estimation, is 60 to 65%. The left ventricle has normal function. The left ventricle has no regional wall motion abnormalities. The left ventricular internal cavity size was normal in size. There is  mild left ventricular hypertrophy. Left ventricular diastolic parameters are consistent with Grade I diastolic dysfunction (  impaired relaxation). Elevated left ventricular end-diastolic pressure. Right Ventricle: The right ventricular size is normal. No increase in right ventricular wall thickness. Right ventricular systolic function is normal. There is normal pulmonary artery systolic pressure. The tricuspid regurgitant velocity is 2.53 m/s, and  with an assumed right atrial pressure of 8 mmHg, the estimated right ventricular systolic pressure is 06.3 mmHg. Left Atrium: Left atrial size was normal in size. Right Atrium: Right atrial size was normal in size. Pericardium: There is no evidence of pericardial effusion. Mitral Valve: The mitral valve is grossly normal. Moderate mitral annular calcification. Mild mitral valve regurgitation. MV peak gradient, 10.1 mmHg. The mean mitral valve gradient is 6.3 mmHg. Tricuspid Valve: The tricuspid valve is grossly normal. Tricuspid valve regurgitation is trivial. Aortic Valve: The aortic valve is normal in structure. Aortic valve regurgitation is not  visualized. Mild aortic stenosis is present. Aortic valve mean gradient measures 14.0 mmHg. Aortic valve peak gradient measures 27.2 mmHg. Aortic valve area, by VTI measures 1.38 cm. Pulmonic Valve: The pulmonic valve was normal in structure. Pulmonic valve regurgitation is not visualized. Aorta: The aortic root and ascending aorta are structurally normal, with no evidence of dilitation. IAS/Shunts: The atrial septum is grossly normal.  LEFT VENTRICLE PLAX 2D LVIDd:         4.30 cm   Diastology LVIDs:         2.80 cm   LV e' medial:    4.24 cm/s LV PW:         1.30 cm   LV E/e' medial:  29.5 LV IVS:        1.10 cm   LV e' lateral:   5.44 cm/s LVOT diam:     1.80 cm   LV E/e' lateral: 23.0 LV SV:         66 LV SV Index:   35 LVOT Area:     2.54 cm  RIGHT VENTRICLE RV S prime:     11.50 cm/s TAPSE (M-mode): 2.0 cm LEFT ATRIUM             Index        RIGHT ATRIUM           Index LA diam:        3.60 cm 1.90 cm/m   RA Area:     16.10 cm LA Vol (A2C):   71.7 ml 37.82 ml/m  RA Volume:   38.50 ml  20.31 ml/m LA Vol (A4C):   44.1 ml 23.26 ml/m LA Biplane Vol: 57.7 ml 30.44 ml/m  AORTIC VALVE AV Area (Vmax):    1.27 cm AV Area (Vmean):   1.37 cm AV Area (VTI):     1.38 cm AV Vmax:           261.00 cm/s AV Vmean:          177.000 cm/s AV VTI:            0.480 m AV Peak Grad:      27.2 mmHg AV Mean Grad:      14.0 mmHg LVOT Vmax:         130.00 cm/s LVOT Vmean:        95.500 cm/s LVOT VTI:          0.260 m LVOT/AV VTI ratio: 0.54  AORTA Ao Root diam: 3.10 cm Ao Asc diam:  2.70 cm MITRAL VALVE                TRICUSPID VALVE MV Area (  PHT): 3.46 cm     TR Peak grad:   25.6 mmHg MV Area VTI:   1.91 cm     TR Vmax:        253.00 cm/s MV Peak grad:  10.1 mmHg MV Mean grad:  6.3 mmHg     SHUNTS MV Vmax:       1.59 m/s     Systemic VTI:  0.26 m MV Vmean:      121.0 cm/s   Systemic Diam: 1.80 cm MV Decel Time: 219 msec MV E velocity: 125.00 cm/s MV A velocity: 165.00 cm/s MV E/A ratio:  0.76 Mertie Moores MD  Electronically signed by Mertie Moores MD Signature Date/Time: 07/30/2021/11:55:45 AM    Final    DG Hip Unilat W or Wo Pelvis 2-3 Views Right  Result Date: 07/29/2021 CLINICAL DATA:  Feeling bad for a couple of days. EXAM: DG HIP (WITH OR WITHOUT PELVIS) 2-3V RIGHT COMPARISON:  None. FINDINGS: There is no evidence of hip fracture or dislocation. Mild degenerative changes seen in the form of joint space narrowing and acetabular sclerosis. There is moderate to marked severity vascular calcification. IMPRESSION: No acute osseous abnormality. Electronically Signed   By: Virgina Norfolk M.D.   On: 07/29/2021 22:26   VAS US CAROTID  Result Date: 07/30/2021 Carotid Arterial Duplex Study Patient Name:  April Pena  Date of Exam:   07/30/2021 Medical Rec #: [id number]      Accession #:    [id number] Date of Birth: [birth year]     Patient Gender: F Patient Age:   7 years Exam Location:  [facility] Procedure:      VAS US CAROTID Referring Phys: Wandra Feinstein RATHORE --------------------------------------------------------------------------------  Indications:       Syncope. Risk Factors:      Hypertension, hyperlipidemia, Diabetes, past history of                    smoking, prior MI, coronary artery disease. Comparison Study:  No prior studies. Performing Technologist: Darlin Coco RDMS, RVT  Examination Guidelines: A complete evaluation includes B-mode imaging, spectral Doppler, color Doppler, and power Doppler as needed of all accessible portions of each vessel. Bilateral testing is considered an integral part of a complete examination. Limited examinations for reoccurring indications may be performed as noted.  Right Carotid Findings: +----------+--------+--------+--------+------------------+--------+             PSV cm/s EDV cm/s Stenosis Plaque Description Comments  +----------+--------+--------+--------+------------------+--------+  CCA Prox   81       9                                               +----------+--------+--------+--------+------------------+--------+  CCA Distal 79       9                                              +----------+--------+--------+--------+------------------+--------+  ICA Prox   64       9                 heterogenous                 +----------+--------+--------+--------+------------------+--------+  ICA Distal 58       15                                             +----------+--------+--------+--------+------------------+--------+  ECA        154                        calcific                     +----------+--------+--------+--------+------------------+--------+ +----------+--------+-------+----------------+-------------------+             PSV cm/s EDV cms Describe         Arm Pressure (mmHG)  +----------+--------+-------+----------------+-------------------+  Subclavian 94               Multiphasic, WNL                      +----------+--------+-------+----------------+-------------------+ +---------+--------+--+--------+--+---------+  Vertebral PSV cm/s 49 EDV cm/s 11 Antegrade  +---------+--------+--+--------+--+---------+  Left Carotid Findings: +----------+--------+--------+--------+----------------------------+--------+             PSV cm/s EDV cm/s Stenosis Plaque Description           Comments  +----------+--------+--------+--------+----------------------------+--------+  CCA Prox   104      17                                                       +----------+--------+--------+--------+----------------------------+--------+  CCA Distal 87       14                heterogenous                           +----------+--------+--------+--------+----------------------------+--------+  ICA Prox   125      27                hyperechoic and heterogenous           +----------+--------+--------+--------+----------------------------+--------+  ICA Distal 70       18                                                        +----------+--------+--------+--------+----------------------------+--------+  ECA        102      0                                                        +----------+--------+--------+--------+----------------------------+--------+ +----------+--------+--------+---------+-------------------+             PSV cm/s EDV cm/s Describe  Arm Pressure (mmHG)  +----------+--------+--------+---------+-------------------+  Subclavian 181               Turbulent                      +----------+--------+--------+---------+-------------------+ +---------+--------+--+--------+-+---------+  Vertebral PSV cm/s 52 EDV cm/s 9 Antegrade  +---------+--------+--+--------+-+---------+   Summary: Right Carotid: Velocities in the right ICA are consistent with a 1-39% stenosis.                The ECA appears <50% stenosed. Left Carotid: Velocities in the left ICA are consistent with a 1-39%  stenosis.               The ECA appears <50% stenosed. Vertebrals:  Bilateral vertebral arteries demonstrate antegrade flow. Subclavians: Left subclavian artery flow was disturbed. Normal flow hemodynamics              were seen in the right subclavian artery. *See table(s) above for measurements and observations.  Electronically signed by Monica Martinez MD on 07/30/2021 at 2:37:40 PM.    Final      Scheduled Meds:  amLODipine  10 mg Oral Daily   ARIPiprazole  10 mg Oral Daily   atorvastatin  20 mg Oral QHS   dorzolamide-timolol  1 drop Both Eyes BID   furosemide  40 mg Oral Daily   heparin  5,000 Units Subcutaneous Q8H   insulin aspart  0-5 Units Subcutaneous QHS   insulin aspart  0-9 Units Subcutaneous TID WC   insulin aspart  2 Units Subcutaneous TID WC   insulin detemir  5 Units Subcutaneous Daily   isosorbide mononitrate  30 mg Oral Once   isosorbide mononitrate  60 mg Oral Daily   loperamide  2 mg Oral Once   metoprolol succinate  25 mg Oral Daily   mycophenolate  360 mg Oral BID   predniSONE  5 mg Oral Q breakfast   sodium  bicarbonate  650 mg Oral BID   sulfamethoxazole-trimethoprim  1 tablet Oral Q M,W,F   tacrolimus  2.5 mg Oral BID   Continuous Infusions:   LOS: 0 days    Time spent: 4min  Domenic Polite, MD [facility]   07/31/2021, 1:23 PM

## 2021-07-31 NOTE — Progress Notes (Signed)
Inpatient Diabetes Program Recommendations  AACE/ADA: New Consensus Statement on Inpatient Glycemic Control (2015)  Target Ranges:  Prepandial:   less than 140 mg/dL      Peak postprandial:   less than 180 mg/dL (1-2 hours)      Critically ill patients:  140 - 180 mg/dL   Lab Results  Component Value Date   GLUCAP 253 (H) 07/31/2021   HGBA1C 7.9 (H) 07/30/2021    Review of Glycemic Control  Latest Reference Range & Units 07/30/21 11:41 07/30/21 15:55 07/30/21 20:43 07/31/21 06:07 07/31/21 10:45  Glucose-Capillary 70 - 99 mg/dL 176 (H) 169 (H) 139 (H) 126 (H) 253 (H)   Diabetes history: DM 2 Outpatient Diabetes medications:  Novolog 10-30 units tid with meals Levemir 5 units  Current orders for Inpatient glycemic control:  Novolog sensitive tid with meals and HS Inpatient Diabetes Program Recommendations:   Consider adding Novolog 3 units tid with meals.  May also consider adding Levemir 5 units daily.   Thanks,  Adah Perl, RN, BC-ADM Inpatient Diabetes Coordinator Pager [phone]  (8a-5p)

## 2021-08-01 DIAGNOSIS — N179 Acute kidney failure, unspecified: Secondary | ICD-10-CM

## 2021-08-01 LAB — GLUCOSE, CAPILLARY: Glucose-Capillary: 112 mg/dL — ABNORMAL HIGH (ref 70–99)

## 2021-08-01 MED ORDER — ISOSORBIDE MONONITRATE ER 60 MG PO TB24
120.0000 mg | ORAL_TABLET | Freq: Every day | ORAL | Status: DC
Start: 2021-08-01 — End: 2021-08-01
  Administered 2021-08-01: 120 mg via ORAL
  Filled 2021-08-01: qty 2

## 2021-08-01 MED ORDER — LOPERAMIDE HCL 2 MG PO CAPS
2.0000 mg | ORAL_CAPSULE | Freq: Once | ORAL | Status: AC
  Administered 2021-08-01: 2 mg via ORAL
  Filled 2021-08-01: qty 1

## 2021-08-01 MED ORDER — LEVEMIR FLEXTOUCH 100 UNIT/ML ~~LOC~~ SOPN: 10.0000 [IU] | PEN_INJECTOR | Freq: Every day | SUBCUTANEOUS | Status: AC

## 2021-08-01 MED ORDER — METOPROLOL SUCCINATE ER 50 MG PO TB24: 50.0000 mg | ORAL_TABLET | Freq: Every day | ORAL | 0 refills | Status: DC

## 2021-08-01 NOTE — Plan of Care (Signed)

## 2021-08-01 NOTE — Progress Notes (Signed)
Nursing DC note  Patient alert and oriented. Verbalized understanding of dc instructions. Ccmd notified of dc. All belongings given topatient. Andrea nt in [employer] notified patient to be transferred there and wait for family.

## 2021-08-01 NOTE — Plan of Care (Signed)
  Problem: Clinical Measurements: Goal: Respiratory complications will improve Outcome: Progressing   Problem: Nutrition: Goal: Adequate nutrition will be maintained Outcome: Progressing   

## 2021-08-01 NOTE — TOC Transition Note (Addendum)
Transition of Care [facility]) - CM/SW Discharge Note   Patient Details  Name: April Pena MRN: [id number] Date of Birth: Aug 23, 1955  Transition of [facility]) CM/SW Contact:  Zenon Mayo, RN Phone Number: 08/01/2021, 9:47 AM   Clinical Narrative:    Patient is for dc today, NCM notified [facility]. Per Staff RN , patient sister will tranport her home and she will be her at 30 noon. Patient having some diarrhea and MD to start Immodium.     Final next level of care: [place] Barriers to Discharge: Continued Medical Work up   Patient Goals and CMS Choice Patient states their goals for this hospitalization and ongoing recovery are:: home with [facility] CMS Medicare.gov Compare Post Acute Care list provided to:: Patient Choice offered to / list presented to : Patient  Discharge Placement                       Discharge Plan and Services   Discharge Planning Services: CM Consult Post Acute Care Choice: Home Health            DME Agency: NA       HH Arranged: RN, PT [facility] Agency: [place] Date [place]: 07/30/21 Time [place]: 1884 Representative spoke with at [place]: [place] ([place]) Interventions     Readmission Risk Interventions No flowsheet data found.

## 2021-08-09 NOTE — Discharge Summary (Signed)
Physician Discharge Summary  April Pena SHF:[id number] DOB: [birth year] DOA: 07/29/2021  PCP: Guadlupe Spanish, MD  Admit date: 07/29/2021 Discharge date: 08/01/2021  Time spent: 35 minutes  Recommendations for Outpatient Follow-up:  Close follow-up with nephrologist Dr. Joesph July in [facility] in 3 to 4 weeks PCP in 1 week, please check BMP at follow-up Please review all imaging studies from this admission for any incidental findings that need follow-up   Discharge Diagnoses:  Principal Problem:   Syncope Active Problems:   CAD (coronary artery disease)   Hypertensive urgency   CKD (chronic kidney disease), stage IV (HCC)   Insulin dependent type 2 diabetes mellitus ([place])   Chronic diastolic CHF (congestive heart failure) ([place])   AKI (acute kidney injury) [facility])   Discharge Condition: Stable  Diet recommendation: Low-sodium, diabetic  Filed Weights   07/30/21 0155 07/31/21 0159 08/01/21 0314  Weight: 84.2 kg 83.3 kg 81.5 kg    History of present illness:  65/F with history of CAD/CABG, chronic diastolic CHF, CKD 4, type 2 diabetes mellitus, history of renal transplant, hypertension, dyslipidemia presented to the ED after syncopal event. -Patient reports feeling weak last few days, had some diarrhea few days ago -2/19 took her medicines, sat down to drink Ensure, and then woke up on the floor at 5 PM, blood sugar was in the 200s, she denied any shortness of breath nausea vomiting abdominal pain, fevers or chills, had some vague chest discomfort yesterday, since resolved -In the ED she was hypertensive, orthostatics were negative, hemoglobin stable, creatinine stable at 2.8, UA was unremarkable, EKG noted sinus rhythm, QTc of 504, troponin 86, 88, COVID and flu PCR were negative, x-rays of the chest, CT head and C-spine were unremarkable  Hospital Course:   Syncope Elevated troponin QT prolongation -Orthostatics negative, no events on telemetry so far -She denies any chest  pain, EKG is nonacute, high-sensitivity troponins are minimally elevated, do not suspect ACS -EKG with QTc of 504, repeat improved at 480 -Electrolytes okay -2D echo with preserved EF, normal wall motion, grade 1 diastolic dysfunction, resumed Toprol -EEG was normal as well -Physical therapy eval completed, home health services recommended   Chronic diastolic CHF (congestive heart failure) (HCC) No signs of volume overload. -Resumed Lasix 40 Mg daily and Toprol   Uncontrolled hypertension -Uncontrolled initially, subsequently stabilized on amlodipine, Imdur, Toprol and oral Lasix   Insulin dependent type 2 diabetes mellitus ([place]) -Poorly controlled, hemoglobin A1c is 7.9, CBGs in the 200s, restart Levemir, add meal coverage   CKD (chronic kidney disease), stage IV ([place])- (present on admission) History of renal transplant Mild metabolic acidosis -Baseline creatinine around 2.5-2.8, creatinine 3.0, relatively stable -On bicarb supplement at home. -Home transplant meds resumed -Followed by nephrologist in [facility], advised close follow-up in 3 to 4 weeks with labs   CAD (coronary artery disease) Status post CABG x3 Work-up not suggestive of ACS. -Continue Toprol  Discharge Exam: Vitals:   08/01/21 0840 08/01/21 1059  BP: (!) 169/80 (!) 149/76  Pulse: 77   Resp:    Temp:    SpO2: 95%    General exam: Chronically ill female appears much older than stated age, AAOx3 HEENT: Left eye with corneal damage CVS: S1-S2, regular rate rhythm Lungs: Clear bilaterally Abdomen: Soft, nontender, bowel sounds present Extremities: No edema  Skin: No rashes Psychiatry: Judgement and insight appear normal. Mood & affect appropriate.     Discharge Instructions   Discharge Instructions     Diet - low sodium  heart healthy   Complete by: As directed    Diet Carb Modified   Complete by: As directed    Increase activity slowly   Complete by: As directed       Allergies as of  08/01/2021       Reactions   Iodinated Contrast Media Other (See Comments)   Kidneys   Iron Hives, Other (See Comments)   Elevated blood sugar, glaucoma worsened,    Promethazine Other (See Comments)   Pt states her muscles jerk   Wellbutrin [bupropion Hcl] Nausea And Vomiting        Medication List     TAKE these medications    acetaminophen 500 MG tablet Commonly known as: TYLENOL Take 500-1,000 mg by mouth every 6 (six) hours as needed (pain).   amLODipine 10 MG tablet Commonly known as: NORVASC Take 1 tablet (10 mg total) by mouth daily.   ARIPiprazole 10 MG tablet Commonly known as: ABILIFY Take 10 mg by mouth daily.   aspirin EC 81 MG tablet Take 81 mg by mouth daily.   atorvastatin 20 MG tablet Commonly known as: LIPITOR TAKE 1 TABLET BY MOUTH DAILY   Baqsimi One Pack 3 MG/DOSE Powd Generic drug: Glucagon Place 1 spray into both nostrils as needed for low blood sugar.   carboxymethylcellulose 0.5 % Soln Commonly known as: REFRESH PLUS Place 1 drop into both eyes 3 (three) times daily as needed (dry eyes).   cholecalciferol 25 MCG (1000 UNIT) tablet Commonly known as: VITAMIN D3 Take 1,000 Units by mouth daily.   Cyanocobalamin 1000 MCG Tbcr Take 1,000 mcg by mouth daily.   dorzolamide-timolol 22.3-6.8 MG/ML ophthalmic solution Commonly known as: COSOPT Place 1 drop into the right eye 2 (two) times daily.   Easy Touch Pen Needles 31G X 5 MM Misc Generic drug: Insulin Pen Needle USE TO INJECT INSULIN FOUR TIMES A DAY   furosemide 40 MG tablet Commonly known as: Lasix Take 1 tablet (40 mg total) by mouth daily.   gabapentin 300 MG capsule Commonly known as: Neurontin Take 1 capsule (300 mg total) by mouth daily. What changed: when to take this   insulin aspart 100 UNIT/ML FlexPen Commonly known as: NOVOLOG Inject 10-30 Units into the skin 3 (three) times daily with meals. Based on CBG or carb count   isosorbide mononitrate 120 MG 24 hr  tablet Commonly known as: IMDUR Take 1 tablet (120 mg total) by mouth daily.   Levemir FlexTouch 100 UNIT/ML FlexPen Generic drug: insulin detemir Inject 10 Units into the skin at bedtime. What changed:  how much to take when to take this   Linzess 145 MCG Caps capsule Generic drug: linaclotide Take 145 mcg by mouth daily as needed for constipation.   metoprolol succinate 50 MG 24 hr tablet Commonly known as: TOPROL-XL Take 1 tablet (50 mg total) by mouth daily. TAKE 1 TABLET BY MOUTH DAILY WITH OR IMMEDIATE FOLLOWING A MEAL What changed:  medication strength how much to take how to take this when to take this   mycophenolate 180 MG EC tablet Commonly known as: MYFORTIC Take 360 mg by mouth 2 (two) times daily.   nitroGLYCERIN 0.4 MG SL tablet Commonly known as: NITROSTAT Place 1 tablet (0.4 mg total) under the tongue every 5 (five) minutes as needed for chest pain.   pantoprazole 40 MG tablet Commonly known as: PROTONIX Take 40 mg by mouth every morning.   predniSONE 5 MG tablet Commonly known as: DELTASONE Take 5  mg by mouth daily with breakfast.   sodium bicarbonate 650 MG tablet Take 650 mg by mouth 2 (two) times daily.   sulfamethoxazole-trimethoprim 400-80 MG tablet Commonly known as: BACTRIM Take 1 tablet by mouth every Monday, Wednesday, and Friday.   tacrolimus 1 MG capsule Commonly known as: PROGRAF Take 2 mg by mouth 2 (two) times daily. Take 2  capsules (2 mg) by mouth every morning, along with 0.5 mg cap= 2.5 mg  and 2.5 capsules (2.5 mg) at night   tacrolimus 0.5 MG capsule Commonly known as: PROGRAF Take 0.5 mg by mouth every 12 (twelve) hours. Taking with 2 x 1 mg = 2.5 mg twice daily.   Vitamin-B Complex Tabs Take 1 tablet by mouth daily.       Allergies  Allergen Reactions   Iodinated Contrast Media Other (See Comments)    Kidneys   Iron Hives and Other (See Comments)    Elevated blood sugar, glaucoma worsened,    Promethazine Other  (See Comments)    Pt states her muscles jerk   Wellbutrin [Bupropion Hcl] Nausea And Vomiting    Follow-up Information     Guadlupe Spanish, MD. Go on 08/06/2021.   Specialty: Internal Medicine Why: @10 :30am Contact information: [place] [address] [phone]         Minus Breeding, MD .   Specialty: Cardiology Contact information: [address] [place] [address] [place], [facility] Follow up.   Specialty: [place] Why: [place] will contact you with apt times Contact information: [place] [address] [phone]                  The results of significant diagnostics from this hospitalization (including imaging, microbiology, ancillary and laboratory) are listed below for reference.    Significant Diagnostic Studies: DG Chest 1 View  Result Date: 07/29/2021 CLINICAL DATA:  Diffuse pain and generalized malaise. EXAM: CHEST  1 VIEW COMPARISON:  August 23, 2020 FINDINGS: Multiple sternal wires are seen. There is mild to moderate severity enlargement of the cardiac silhouette which is unchanged in severity. Moderate severity diffuse calcification of the thoracic aorta is seen. Both lungs are clear. The visualized skeletal structures are unremarkable. IMPRESSION: 1. Stable cardiomegaly. 2. No acute or active cardiopulmonary disease. Electronically Signed   By: Virgina Norfolk M.D.   On: 07/29/2021 22:25   CT Head Wo Contrast  Result Date: 07/29/2021 CLINICAL DATA:  Mental status changes of unknown cause. EXAM: CT HEAD WITHOUT CONTRAST TECHNIQUE: Contiguous axial images were obtained from the base of the skull through the vertex without intravenous contrast. RADIATION DOSE REDUCTION: This exam was performed according to the departmental dose-optimization program which includes automated exposure control, adjustment of the mA and/or kV according to patient  size and/or use of iterative reconstruction technique. COMPARISON:  12/19/2020 FINDINGS: Brain: Age related volume loss. Chronic small-vessel ischemic changes of the white matter. No evidence of acute infarction, mass lesion, hemorrhage, hydrocephalus or extra-axial collection. Vascular: Right carotid stent is visible. There is atherosclerotic calcification of the major vessels at the base of the brain. Skull: Normal Sinuses/Orbits: Clear sinuses.  Bilateral orbital silastic implants. Other: None IMPRESSION: No acute intracranial finding. Chronic small-vessel ischemic changes of the cerebral hemispheric white matter. Electronically Signed   By: Nelson Chimes M.D.   On: 07/29/2021 22:22   CT Cervical Spine Wo Contrast  Result  Date: 07/29/2021 CLINICAL DATA:  [place] today.  Neck trauma. EXAM: CT CERVICAL SPINE WITHOUT CONTRAST TECHNIQUE: Multidetector CT imaging of the cervical spine was performed without intravenous contrast. Multiplanar CT image reconstructions were also generated. RADIATION DOSE REDUCTION: This exam was performed according to the departmental dose-optimization program which includes automated exposure control, adjustment of the mA and/or kV according to patient size and/or use of iterative reconstruction technique. COMPARISON:  05/03/2013 FINDINGS: Alignment: No traumatic malalignment. Skull base and vertebrae: No regional fracture. Soft tissues and spinal canal: No traumatic soft tissue finding. Disc levels: Mild cervical spondylosis C5-6 but without significant canal or foraminal narrowing. Upper chest: Negative Other: Atherosclerotic calcification affecting the arterial structures in the neck. IMPRESSION: No acute or traumatic finding. Minimal mid cervical degenerative changes. Electronically Signed   By: Nelson Chimes M.D.   On: 07/29/2021 22:23   EEG adult  Result Date: 07/30/2021 Lora Havens, MD     07/30/2021  8:39 AM Patient Name: April Pena MRN: [id number] Epilepsy Attending:  Lora Havens Referring Physician/Provider: Shela Leff, MD Date: 07/30/2021 Duration: 27.10 mins Patient history: 66yo F with syncope. EEG to evaluate for seizure Level of alertness: Awake AEDs during EEG study: None Technical aspects: This EEG study was done with scalp electrodes positioned according to the 10-20 International system of electrode placement. Electrical activity was acquired at a sampling rate of 500Hz  and reviewed with a high frequency filter of 70Hz  and a low frequency filter of 1Hz . EEG data were recorded continuously and digitally stored. Description: The posterior dominant rhythm consists of 8-9 Hz activity of moderate voltage (25-35 uV) seen predominantly in posterior head regions, symmetric and reactive to eye opening and eye closing. Physiologic photic driving was not seen during photic stimulation. Hyperventilation was not performed.   IMPRESSION: This study is within normal limits. No seizures or epileptiform discharges were seen throughout the recording. Lora Havens   ECHOCARDIOGRAM COMPLETE  Result Date: 07/30/2021    ECHOCARDIOGRAM REPORT   Patient Name:   April Pena Date of Exam: 07/30/2021 Medical Rec #:  [id number]     Height:       64.0 in Accession #:    [id number]    Weight:       185.7 lb Date of Birth:  [birth year]    BSA:          1.896 m Patient Age:    7 years      BP:           160/80 mmHg Patient Gender: F             HR:           93 bpm. Exam Location:  Inpatient Procedure: 2D Echo, Color Doppler and Cardiac Doppler Indications:    R55 Syncope  History:        Patient has prior history of Echocardiogram examinations, most                 recent 08/04/2020. CHF, Prior CABG, Pulmonary HTN; Risk                 Factors:Hypertension and Diabetes.  Sonographer:    Raquel Sarna Senior RDCS Referring Phys: [id number] [place]  1. Left ventricular ejection fraction, by estimation, is 60 to 65%. The left ventricle has normal function. The left  ventricle has no regional wall motion abnormalities. There is mild left ventricular hypertrophy. Left ventricular diastolic parameters are consistent with Grade I diastolic dysfunction (impaired relaxation).  Elevated left ventricular end-diastolic pressure.  2. Right ventricular systolic function is normal. The right ventricular size is normal. There is normal pulmonary artery systolic pressure.  3. The mitral valve is grossly normal. Mild mitral valve regurgitation. Moderate mitral annular calcification.  4. The aortic valve is normal in structure. Aortic valve regurgitation is not visualized. Mild aortic valve stenosis. FINDINGS  Left Ventricle: Left ventricular ejection fraction, by estimation, is 60 to 65%. The left ventricle has normal function. The left ventricle has no regional wall motion abnormalities. The left ventricular internal cavity size was normal in size. There is  mild left ventricular hypertrophy. Left ventricular diastolic parameters are consistent with Grade I diastolic dysfunction (impaired relaxation). Elevated left ventricular end-diastolic pressure. Right Ventricle: The right ventricular size is normal. No increase in right ventricular wall thickness. Right ventricular systolic function is normal. There is normal pulmonary artery systolic pressure. The tricuspid regurgitant velocity is 2.53 m/s, and  with an assumed right atrial pressure of 8 mmHg, the estimated right ventricular systolic pressure is 68.0 mmHg. Left Atrium: Left atrial size was normal in size. Right Atrium: Right atrial size was normal in size. Pericardium: There is no evidence of pericardial effusion. Mitral Valve: The mitral valve is grossly normal. Moderate mitral annular calcification. Mild mitral valve regurgitation. MV peak gradient, 10.1 mmHg. The mean mitral valve gradient is 6.3 mmHg. Tricuspid Valve: The tricuspid valve is grossly normal. Tricuspid valve regurgitation is trivial. Aortic Valve: The aortic valve is  normal in structure. Aortic valve regurgitation is not visualized. Mild aortic stenosis is present. Aortic valve mean gradient measures 14.0 mmHg. Aortic valve peak gradient measures 27.2 mmHg. Aortic valve area, by VTI measures 1.38 cm. Pulmonic Valve: The pulmonic valve was normal in structure. Pulmonic valve regurgitation is not visualized. Aorta: The aortic root and ascending aorta are structurally normal, with no evidence of dilitation. IAS/Shunts: The atrial septum is grossly normal.  LEFT VENTRICLE PLAX 2D LVIDd:         4.30 cm   Diastology LVIDs:         2.80 cm   LV e' medial:    4.24 cm/s LV PW:         1.30 cm   LV E/e' medial:  29.5 LV IVS:        1.10 cm   LV e' lateral:   5.44 cm/s LVOT diam:     1.80 cm   LV E/e' lateral: 23.0 LV SV:         66 LV SV Index:   35 LVOT Area:     2.54 cm  RIGHT VENTRICLE RV S prime:     11.50 cm/s TAPSE (M-mode): 2.0 cm LEFT ATRIUM             Index        RIGHT ATRIUM           Index LA diam:        3.60 cm 1.90 cm/m   RA Area:     16.10 cm LA Vol (A2C):   71.7 ml 37.82 ml/m  RA Volume:   38.50 ml  20.31 ml/m LA Vol (A4C):   44.1 ml 23.26 ml/m LA Biplane Vol: 57.7 ml 30.44 ml/m  AORTIC VALVE AV Area (Vmax):    1.27 cm AV Area (Vmean):   1.37 cm AV Area (VTI):     1.38 cm AV Vmax:           261.00 cm/s AV Vmean:  177.000 cm/s AV VTI:            0.480 m AV Peak Grad:      27.2 mmHg AV Mean Grad:      14.0 mmHg LVOT Vmax:         130.00 cm/s LVOT Vmean:        95.500 cm/s LVOT VTI:          0.260 m LVOT/AV VTI ratio: 0.54  AORTA Ao Root diam: 3.10 cm Ao Asc diam:  2.70 cm MITRAL VALVE                TRICUSPID VALVE MV Area (PHT): 3.46 cm     TR Peak grad:   25.6 mmHg MV Area VTI:   1.91 cm     TR Vmax:        253.00 cm/s MV Peak grad:  10.1 mmHg MV Mean grad:  6.3 mmHg     SHUNTS MV Vmax:       1.59 m/s     Systemic VTI:  0.26 m MV Vmean:      121.0 cm/s   Systemic Diam: 1.80 cm MV Decel Time: 219 msec MV E velocity: 125.00 cm/s MV A velocity: 165.00  cm/s MV E/A ratio:  0.76 Mertie Moores MD Electronically signed by Mertie Moores MD Signature Date/Time: 07/30/2021/11:55:45 AM    Final    DG Hip Unilat W or Wo Pelvis 2-3 Views Right  Result Date: 07/29/2021 CLINICAL DATA:  Feeling bad for a couple of days. EXAM: DG HIP (WITH OR WITHOUT PELVIS) 2-3V RIGHT COMPARISON:  None. FINDINGS: There is no evidence of hip fracture or dislocation. Mild degenerative changes seen in the form of joint space narrowing and acetabular sclerosis. There is moderate to marked severity vascular calcification. IMPRESSION: No acute osseous abnormality. Electronically Signed   By: Virgina Norfolk M.D.   On: 07/29/2021 22:26   VAS US CAROTID  Result Date: 07/30/2021 Carotid Arterial Duplex Study Patient Name:  April Pena  Date of Exam:   07/30/2021 Medical Rec #: [id number]      Accession #:    [id number] Date of Birth: 02/06/1956     Patient Gender: F Patient Age:   40 years Exam Location[facility]  [facility] Procedure:      VAS US CAROTID Referring Phys: Wandra Feinstein RATHORE --------------------------------------------------------------------------------  Indications:       Syncope. Risk Factors:      Hypertension, hyperlipidemia, Diabetes, past history of                    smoking, prior MI, coronary artery disease. Comparison Study:  No prior studies. Performing Technologist: Darlin Coco RDMS, RVT  Examination Guidelines: A complete evaluation includes B-mode imaging, spectral Doppler, color Doppler, and power Doppler as needed of all accessible portions of each vessel. Bilateral testing is considered an integral part of a complete examination. Limited examinations for reoccurring indications may be performed as noted.  Right Carotid Findings: +----------+--------+--------+--------+------------------+--------+             PSV cm/s EDV cm/s Stenosis Plaque Description Comments  +----------+--------+--------+--------+------------------+--------+  CCA Prox   81       9                                               +----------+--------+--------+--------+------------------+--------+  CCA Distal 79  9                                              +----------+--------+--------+--------+------------------+--------+  ICA Prox   64       9                 heterogenous                 +----------+--------+--------+--------+------------------+--------+  ICA Distal 58       15                                             +----------+--------+--------+--------+------------------+--------+  ECA        154                        calcific                     +----------+--------+--------+--------+------------------+--------+ +----------+--------+-------+----------------+-------------------+             PSV cm/s EDV cms Describe         Arm Pressure (mmHG)  +----------+--------+-------+----------------+-------------------+  Subclavian 94               Multiphasic, WNL                      +----------+--------+-------+----------------+-------------------+ +---------+--------+--+--------+--+---------+  Vertebral PSV cm/s 49 EDV cm/s 11 Antegrade  +---------+--------+--+--------+--+---------+  Left Carotid Findings: +----------+--------+--------+--------+----------------------------+--------+             PSV cm/s EDV cm/s Stenosis Plaque Description           Comments  +----------+--------+--------+--------+----------------------------+--------+  CCA Prox   104      17                                                       +----------+--------+--------+--------+----------------------------+--------+  CCA Distal 87       14                heterogenous                           +----------+--------+--------+--------+----------------------------+--------+  ICA Prox   125      27                hyperechoic and heterogenous           +----------+--------+--------+--------+----------------------------+--------+  ICA Distal 70       18                                                        +----------+--------+--------+--------+----------------------------+--------+  ECA        102      0                                                        +----------+--------+--------+--------+----------------------------+--------+ +----------+--------+--------+---------+-------------------+  PSV cm/s EDV cm/s Describe  Arm Pressure (mmHG)  +----------+--------+--------+---------+-------------------+  Subclavian 181               Turbulent                      +----------+--------+--------+---------+-------------------+ +---------+--------+--+--------+-+---------+  Vertebral PSV cm/s 52 EDV cm/s 9 Antegrade  +---------+--------+--+--------+-+---------+   Summary: Right Carotid: Velocities in the right ICA are consistent with a 1-39% stenosis.                The ECA appears <50% stenosed. Left Carotid: Velocities in the left ICA are consistent with a 1-39% stenosis.               The ECA appears <50% stenosed. Vertebrals:  Bilateral vertebral arteries demonstrate antegrade flow. Subclavians: Left subclavian artery flow was disturbed. Normal flow hemodynamics              were seen in the right subclavian artery. *See table(s) above for measurements and observations.  Electronically signed by Monica Martinez MD on 07/30/2021 at 2:37:40 PM.    Final     Microbiology: No results found for this or any previous visit (from the past 240 hour(s)).   Labs: Basic Metabolic Panel: No results for input(s): NA, K, CL, CO2, GLUCOSE, BUN, CREATININE, CALCIUM, MG, PHOS in the last 168 hours. Liver Function Tests: No results for input(s): AST, ALT, ALKPHOS, BILITOT, PROT, ALBUMIN in the last 168 hours. No results for input(s): LIPASE, AMYLASE in the last 168 hours. No results for input(s): AMMONIA in the last 168 hours. CBC: No results for input(s): WBC, NEUTROABS, HGB, HCT, MCV, PLT in the last 168 hours. Cardiac Enzymes: No results for input(s): CKTOTAL, CKMB, CKMBINDEX, TROPONINI in the last 168  hours. BNP: BNP (last 3 results) No results for input(s): BNP in the last 8760 hours.  ProBNP (last 3 results) No results for input(s): PROBNP in the last 8760 hours.  CBG: No results for input(s): GLUCAP in the last 168 hours.     Signed:  Domenic Polite MD.  [facility] 08/09/2021, 3:17 PM

## 2021-09-24 ENCOUNTER — Other Ambulatory Visit: Payer: Self-pay | Admitting: Cardiology

## 2021-10-02 ENCOUNTER — Ambulatory Visit: Payer: Medicare Other | Admitting: Cardiology

## 2021-11-19 ENCOUNTER — Ambulatory Visit: Payer: Medicare Other | Admitting: Cardiology

## 2021-12-02 NOTE — Progress Notes (Deleted)
Cardiology Office Note   Date:  12/02/2021   ID:  April Pena, April Pena [birth year], MRN [id number]  PCP:  April Spanish, MD  Cardiologist:   Minus Breeding, MD   No chief complaint on file.     History of Present Illness: April Pena is a 66 y.o. female who presents for  management of CAD.  She has a history of non-STEMI and 03/2016, CABG x3 with SVG to OM1-OM 2, and SVG to PDA on 04/12/2016.  She has had a subsequent admission in March 2019 for recurrent chest pain that was found to be atypical and had ruled out for MI.  Lexiscan was recommended but she refused.  Other history includes renal transplant, diabetes type 2, dyslipidemia, and hypertension.  She returns for follow up. I was able to review an echocardiogram from [place].  She does have some tricuspid regurgitation with some moderate pulmonary hypertension.   She was to get an echo but this was cancelled.   ***   ***   I did not see an echocardiogram from [facility].  She had some moderate pulmonary hypertension with tricuspid regurgitation in February.  Of note at [facility] last year she has an elevated cardiac enzymes but that was managed medically because of elevated creatinine.  She says she has not had chest discomfort since then.  However, she has had some palpitations.  She said this has been rare and fleeting.  She has taken nitroglycerin on a couple of occasions and took some last week.  However, she says this is unusual.  The symptoms go away quickly.  She feels her heart racing when it happens.  She is not able to bring this on.  She says she is able to do things like some grocery shopping cannot bring on this discomfort.  She is not having any PND or orthopnea.  She is not having any new shortness of breath with activities although she is somewhat limited walking with a walker because of right leg weakness.   Past Medical History:  Diagnosis Date   Blind left eye    CHF (congestive heart failure) ([place])     Diabetes mellitus    Glaucoma    Hyperlipidemia 04/11/2016   Hypertensive heart disease 04/11/2016   Kidney failure    s/p renal transplant   MI (myocardial infarction) [facility])     Past Surgical History:  Procedure Laterality Date   ABDOMINAL HYSTERECTOMY     CARDIAC CATHETERIZATION N/A 04/08/2016   Procedure: Left Heart Cath and Coronary Angiography;  Surgeon: Burnell Blanks, MD;  Location: [place] [facility];  Service: Cardiovascular;  Laterality: N/A;   CATARACT EXTRACTION     CHOLECYSTECTOMY     CORONARY ARTERY BYPASS GRAFT N/A 04/12/2016   Procedure: CORONARY ARTERY BYPASS GRAFTING (CABG) times three using left saphenous vein harvested endoscopically;  Surgeon: Gaye Pollack, MD;  Location: [facility] OR;  Service: Open Heart Surgery;  Laterality: N/A;  SEQ SVG-OM1-OM2 SVG-PD   EYE SURGERY     cataract   KIDNEY TRANSPLANT  2011   TEE WITHOUT CARDIOVERSION N/A 04/12/2016   Procedure: TRANSESOPHAGEAL ECHOCARDIOGRAM (TEE);  Surgeon: Gaye Pollack, MD;  Location: [place];  Service: Open Heart Surgery;  Laterality: N/A;   TONSILLECTOMY     TRIGGER FINGER RELEASE       Current Outpatient Medications  Medication Sig Dispense Refill   acetaminophen (TYLENOL) 500 MG tablet Take 500-1,000 mg by mouth every 6 (six) hours as needed (  pain).      amLODipine (NORVASC) 10 MG tablet Take 1 tablet (10 mg total) by mouth daily. 30 tablet 6   ARIPiprazole (ABILIFY) 10 MG tablet Take 10 mg by mouth daily.     aspirin EC 81 MG tablet Take 81 mg by mouth daily.     atorvastatin (LIPITOR) 20 MG tablet TAKE 1 TABLET BY MOUTH DAILY (Patient taking differently: Take 20 mg by mouth daily.) 30 tablet 1   B Complex Vitamins (VITAMIN-B COMPLEX) TABS Take 1 tablet by mouth daily.     BAQSIMI ONE PACK 3 MG/DOSE POWD Place 1 spray into both nostrils as needed for low blood sugar.     carboxymethylcellulose (REFRESH PLUS) 0.5 % SOLN Place 1 drop into both eyes 3 (three) times daily as needed (dry eyes).      cholecalciferol (VITAMIN D3) 25 MCG (1000 UNIT) tablet Take 1,000 Units by mouth daily.     Cyanocobalamin 1000 MCG TBCR Take 1,000 mcg by mouth daily.     dorzolamide-timolol (COSOPT) 22.3-6.8 MG/ML ophthalmic solution Place 1 drop into the right eye 2 (two) times daily.     furosemide (LASIX) 40 MG tablet Take 1 tablet (40 mg total) by mouth daily. 30 tablet 1   gabapentin (NEURONTIN) 300 MG capsule Take 1 capsule (300 mg total) by mouth daily. (Patient taking differently: Take 300 mg by mouth at bedtime.)     insulin aspart (NOVOLOG) 100 UNIT/ML FlexPen Inject 10-30 Units into the skin 3 (three) times daily with meals. Based on CBG or carb count     insulin detemir (LEVEMIR FLEXTOUCH) 100 UNIT/ML FlexPen Inject 10 Units into the skin at bedtime.     Insulin Pen Needle (EASY TOUCH PEN NEEDLES) 31G X 5 MM MISC USE TO INJECT INSULIN FOUR TIMES A DAY     isosorbide mononitrate (IMDUR) 120 MG 24 hr tablet Take 1 tablet (120 mg total) by mouth daily. 90 tablet 3   LINZESS 145 MCG CAPS capsule Take 145 mcg by mouth daily as needed for constipation.     metoprolol succinate (TOPROL-XL) 100 MG 24 hr tablet TAKE 1 TABLET BY MOUTH DAILY WITH OR IMMEDIATE FOLLOWING A MEAL 90 tablet 1   metoprolol succinate (TOPROL-XL) 50 MG 24 hr tablet Take 1 tablet (50 mg total) by mouth daily. TAKE 1 TABLET BY MOUTH DAILY WITH OR IMMEDIATE FOLLOWING A MEAL 30 tablet 0   mycophenolate (MYFORTIC) 180 MG EC tablet Take 360 mg by mouth 2 (two) times daily.      nitroGLYCERIN (NITROSTAT) 0.4 MG SL tablet Place 1 tablet (0.4 mg total) under the tongue every 5 (five) minutes as needed for chest pain. 30 tablet 0   pantoprazole (PROTONIX) 40 MG tablet Take 40 mg by mouth every morning.     predniSONE (DELTASONE) 5 MG tablet Take 5 mg by mouth daily with breakfast.     sodium bicarbonate 650 MG tablet Take 650 mg by mouth 2 (two) times daily.     sulfamethoxazole-trimethoprim (BACTRIM,SEPTRA) 400-80 MG per tablet Take 1 tablet by  mouth every Monday, Wednesday, and Friday.      tacrolimus (PROGRAF) 0.5 MG capsule Take 0.5 mg by mouth every 12 (twelve) hours. Taking with 2 x 1 mg = 2.5 mg twice daily.     tacrolimus (PROGRAF) 1 MG capsule Take 2 mg by mouth 2 (two) times daily. Take 2  capsules (2 mg) by mouth every morning, along with 0.5 mg cap= 2.5 mg  and 2.5 capsules (  2.5 mg) at night     No current facility-administered medications for this visit.    Allergies:   Iodinated contrast media, Iron, Promethazine, and Wellbutrin [bupropion hcl]    ROS:  Please see the history of present illness.   Otherwise, review of systems are positive for ***.   All other systems are reviewed and negative.    PHYSICAL EXAM: VS:  There were no vitals taken for this visit. , BMI There is no height or weight on file to calculate BMI. GENERAL:  Well appearing NECK:  No jugular venous distention, waveform within normal limits, carotid upstroke brisk and symmetric, no bruits, no thyromegaly LUNGS:  Clear to auscultation bilaterally CHEST:  Unremarkable HEART:  PMI not displaced or sustained,S1 and S2 within normal limits, no S3, no S4, no clicks, no rubs, *** murmurs ABD:  Flat, positive bowel sounds normal in frequency in pitch, no bruits, no rebound, no guarding, no midline pulsatile mass, no hepatomegaly, no splenomegaly EXT:  2 plus pulses throughout, no edema, no cyanosis no clubbing    ***GENERAL:  Well appearing NECK:  No jugular venous distention, waveform within normal limits, carotid upstroke brisk and symmetric, no bruits, no thyromegaly LUNGS:  Clear to auscultation bilaterally CHEST:  Unremarkable HEART:  PMI not displaced or sustained,S1 and S2 within normal limits, no S3, no S4, no clicks, no rubs, 2 out of 6 apical and left mid sternal border systolic murmur, no diastolic murmurs ABD:  Flat, positive bowel sounds normal in frequency in pitch, positive bruits, no rebound, no guarding, no midline pulsatile mass, no  hepatomegaly, no splenomegaly EXT:  2 plus pulses throughout, no edema, no cyanosis no clubbing   EKG:  EKG is *** ordered today. The ekg ordered today demonstrates sinus rhythm, rate ***, left axis deviation, nonspecific lateral T wave changes.  No acute ST-T wave changes.   Recent Labs: 07/29/2021: Hemoglobin 11.3; Platelets 247 07/30/2021: Magnesium 1.8 07/31/2021: BUN 49; Creatinine, Ser 3.08; Potassium 4.2; Sodium 135    Lipid Panel    Component Value Date/Time   CHOL 149 04/08/2016 0345   TRIG 71 04/08/2016 0345   HDL 61 04/08/2016 0345   CHOLHDL 2.4 04/08/2016 0345   VLDL 14 04/08/2016 0345   LDLCALC 74 04/08/2016 0345      Wt Readings from Last 3 Encounters:  08/01/21 179 lb 11.2 oz (81.5 kg)  05/29/21 174 lb (78.9 kg)  12/03/19 191 lb 12.8 oz (87 kg)      Other studies Reviewed: Additional studies/ records that were reviewed today include: *** Review of the above records demonstrates:  Please see elsewhere in the note.     ASSESSMENT AND PLAN:  Coronary artery disease:    ***  The patient has no new sypmtoms.  No further cardiovascular testing is indicated.  We will continue with aggressive risk reduction and meds as listed.  Hypertension:   Blood pressures is ***  140/90 at home and she will keep a blood pressure diary and let me know if it is not at this target.   Hypercholesterolemia:   Her LDL was *** 67.  No change in therapy.   TR: ***  I was able to review an echocardiogram from [facility].  She does have some tricuspid regurgitation with some moderate pulmonary hypertension.  I would like to repeat this echocardiogram in February of next year.  Palpitations:   ***   She will let me know if she has any increasing palpitations at which point I  will order a monitor. . CKD: She follows with nephrology status post transplant.   ***   Current medicines are reviewed at length with the patient today.  The patient does not have concerns regarding medicines.  The  following changes have been made:  ***  Labs/ tests ordered today include:  ***  No orders of the defined types were placed in this encounter.    Disposition:   FU with ***   Signed, Minus Breeding, MD  12/02/2021 2:39 PM    [place]

## 2021-12-04 ENCOUNTER — Ambulatory Visit: Payer: Medicare Other | Admitting: Cardiology

## 2021-12-04 DIAGNOSIS — E785 Hyperlipidemia, unspecified: Secondary | ICD-10-CM

## 2021-12-04 DIAGNOSIS — R002 Palpitations: Secondary | ICD-10-CM

## 2021-12-04 DIAGNOSIS — I2510 Atherosclerotic heart disease of native coronary artery without angina pectoris: Secondary | ICD-10-CM

## 2021-12-04 DIAGNOSIS — I10 Essential (primary) hypertension: Secondary | ICD-10-CM

## 2022-02-11 ENCOUNTER — Other Ambulatory Visit: Payer: Self-pay | Admitting: Cardiology

## 2022-05-04 ENCOUNTER — Other Ambulatory Visit: Payer: Self-pay | Admitting: Cardiology

## 2022-05-04 DIAGNOSIS — I10 Essential (primary) hypertension: Secondary | ICD-10-CM

## 2022-05-04 DIAGNOSIS — E785 Hyperlipidemia, unspecified: Secondary | ICD-10-CM

## 2022-05-04 DIAGNOSIS — I2510 Atherosclerotic heart disease of native coronary artery without angina pectoris: Secondary | ICD-10-CM

## 2022-05-04 DIAGNOSIS — R011 Cardiac murmur, unspecified: Secondary | ICD-10-CM

## 2022-05-08 NOTE — Telephone Encounter (Signed)
Called and left a detailed voice message for the patient asking to give our office a call to schedule an appointment.  Request sent to MD and RN to review. Two different Rx for different dose amounts listed for patient

## 2022-05-08 NOTE — Telephone Encounter (Signed)
Left message for pt to call.

## 2022-05-09 MED ORDER — METOPROLOL SUCCINATE ER 50 MG PO TB24: 50.0000 mg | ORAL_TABLET | Freq: Every day | ORAL | 0 refills | Status: AC

## 2022-05-09 NOTE — Telephone Encounter (Signed)
Refill sent to the pharmacy electronically.  

## 2022-05-21 ENCOUNTER — Other Ambulatory Visit: Payer: Self-pay | Admitting: Cardiology

## 2022-05-21 DIAGNOSIS — R011 Cardiac murmur, unspecified: Secondary | ICD-10-CM

## 2022-05-21 DIAGNOSIS — I10 Essential (primary) hypertension: Secondary | ICD-10-CM

## 2022-05-21 DIAGNOSIS — E785 Hyperlipidemia, unspecified: Secondary | ICD-10-CM

## 2022-05-21 DIAGNOSIS — I2510 Atherosclerotic heart disease of native coronary artery without angina pectoris: Secondary | ICD-10-CM

## 2022-10-09 DEATH — deceased

## 2023-07-07 IMAGING — DX DG HIP (WITH OR WITHOUT PELVIS) 2-3V*R*
3 series · 3 of 3 positions shown · non-contrast
Comparison: None.

CLINICAL DATA: Feeling bad for a couple of days.

EXAM:
DG HIP (WITH OR WITHOUT PELVIS) 2-3V RIGHT

[pelvis ap]
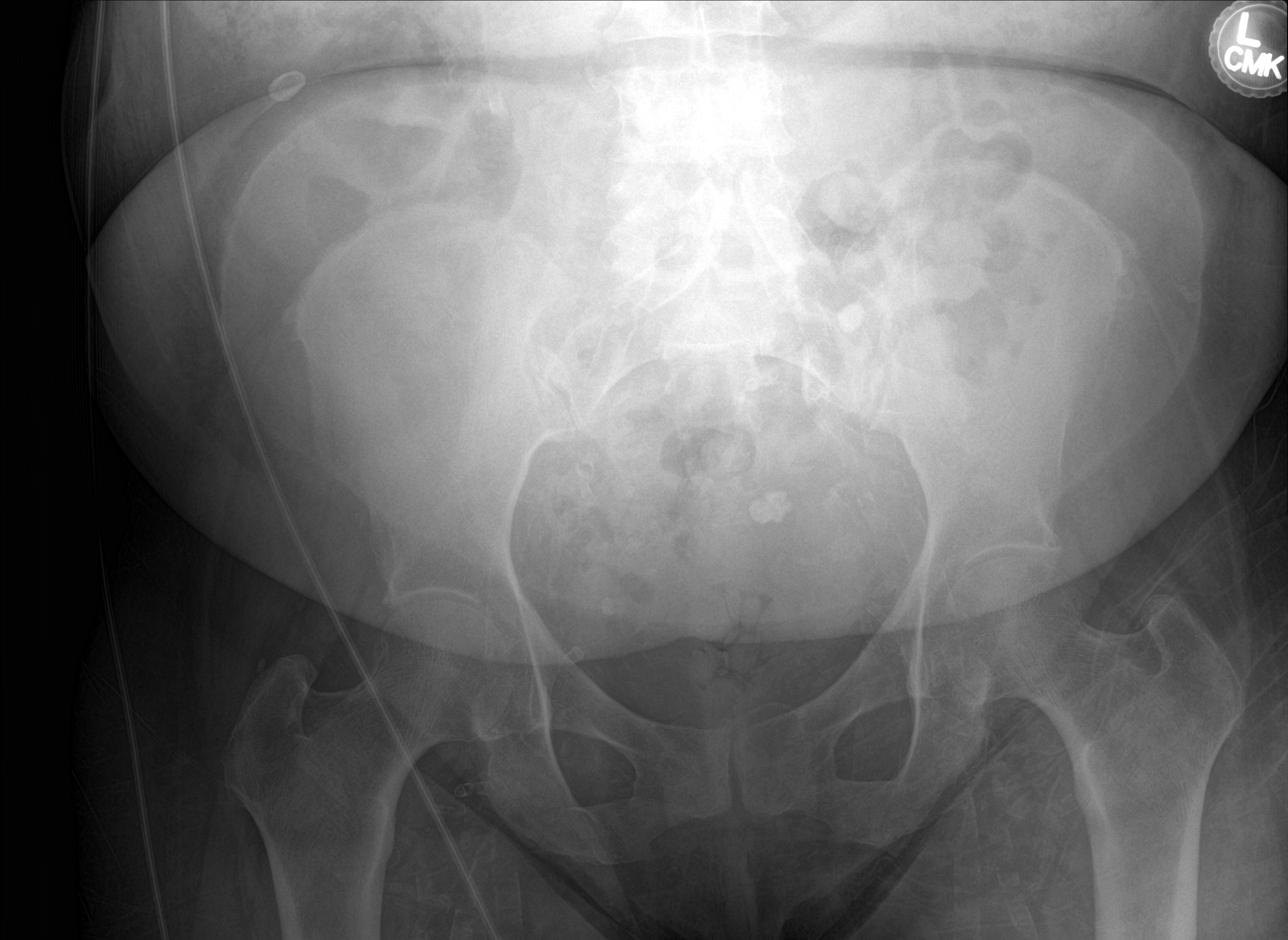

[hip ap]
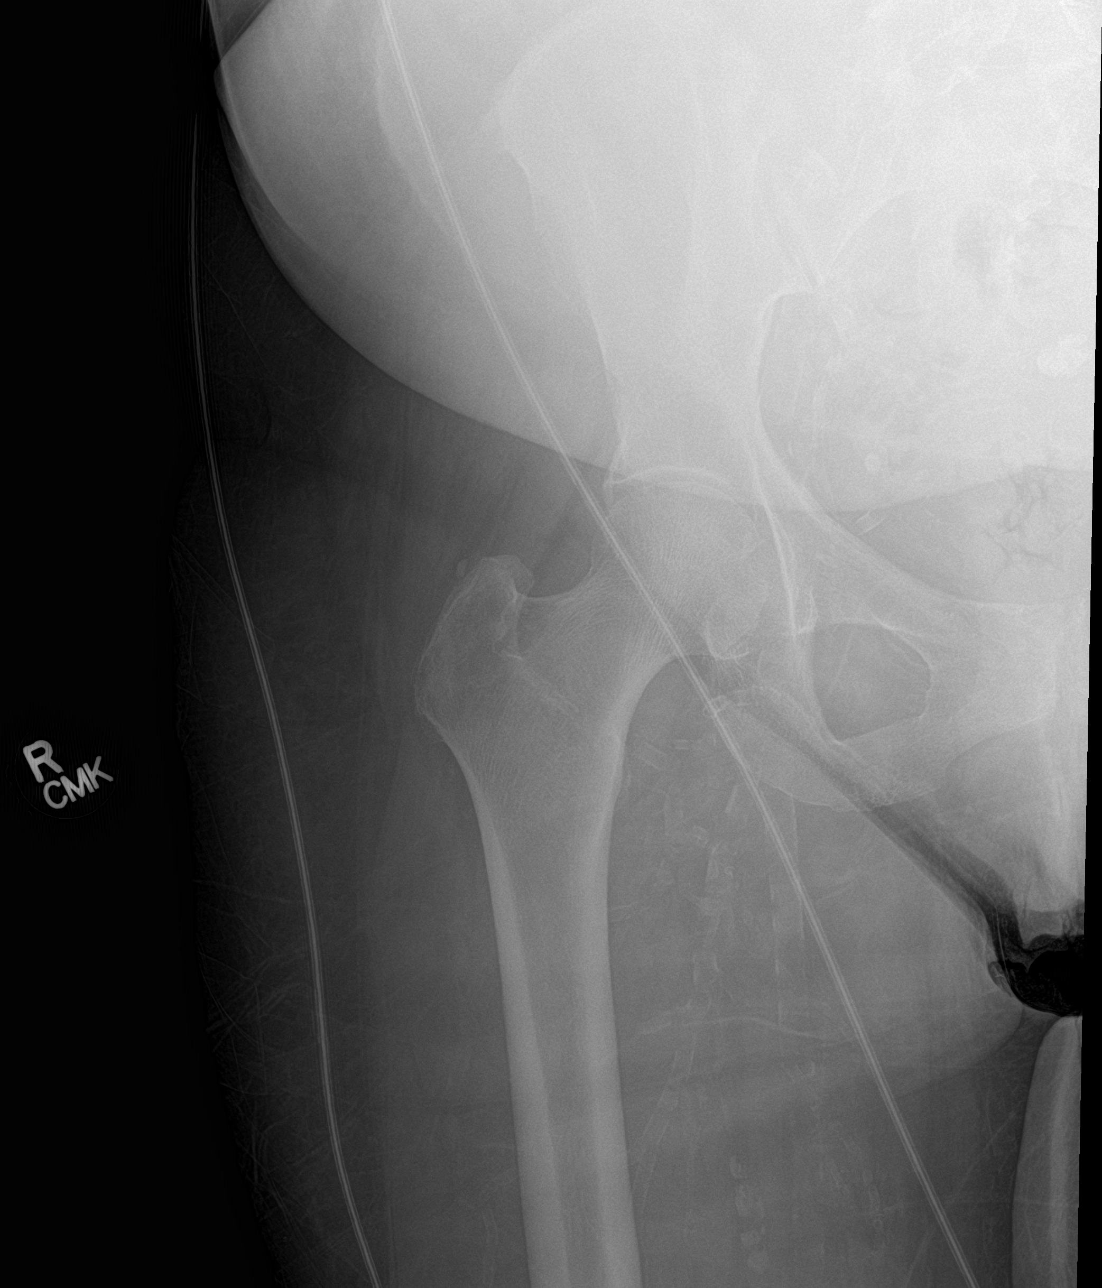

[hip lat]
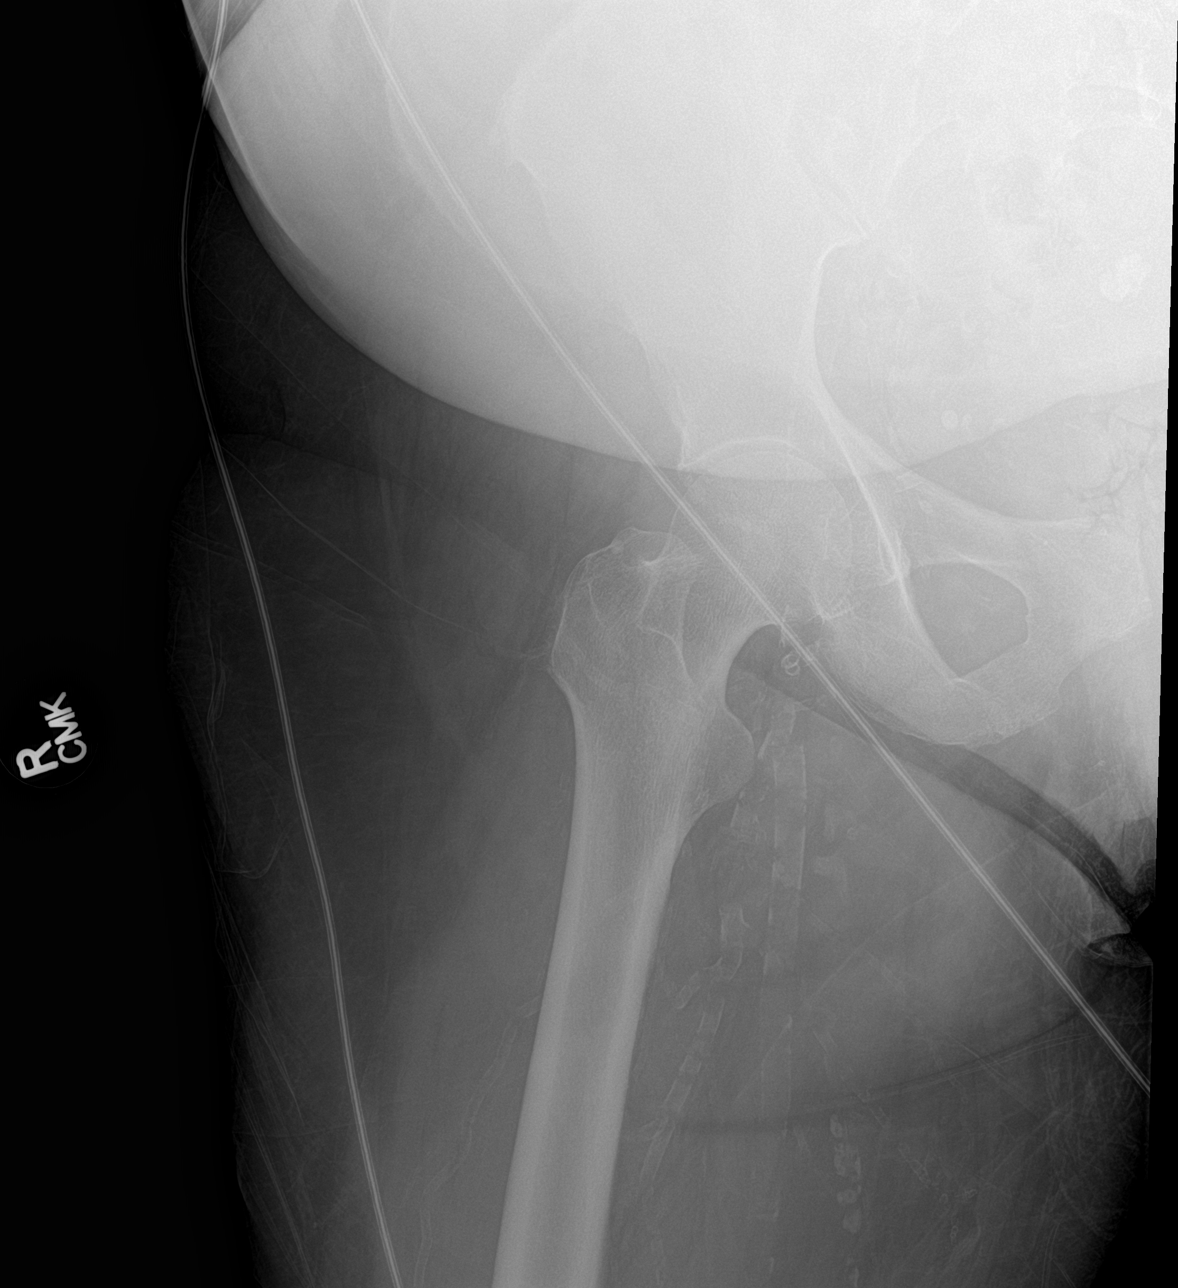

[3 of 3 positions shown; findings below may reference images not displayed]

FINDINGS: There is no evidence of hip fracture or dislocation. Mild
degenerative changes seen in the form of joint space narrowing and
acetabular sclerosis. There is moderate to marked severity vascular
calcification.
IMPRESSION: No acute osseous abnormality.

## 2023-07-07 IMAGING — CT CT HEAD W/O CM
3 series · 16 of 47 positions shown, 19 images · non-contrast
Comparison: 12/19/2020

CLINICAL DATA: Mental status changes of unknown cause.



[Series 2: head 5.0 h30s · axial · 0.48mm/px · z∈[-208,-73]mm · 10 of 33 slices shown, 13 images]
[im 3/33  brain]
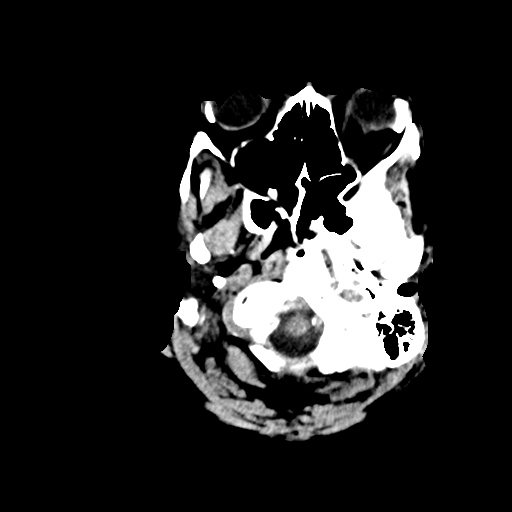
[im 3/33  bone]
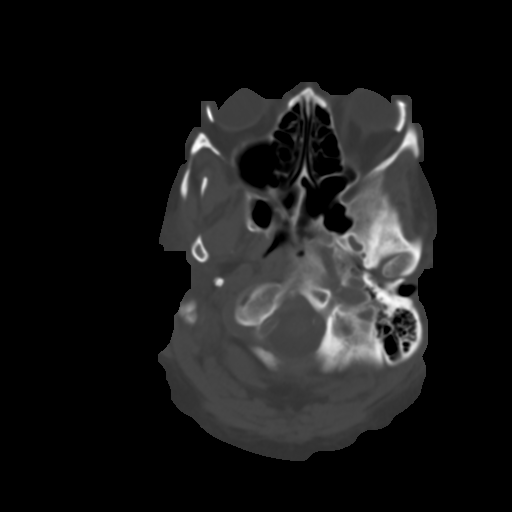
[im 6/33  brain]
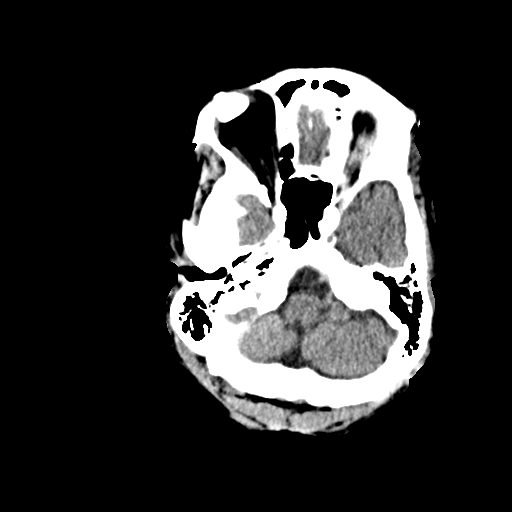
[im 9/33  brain]
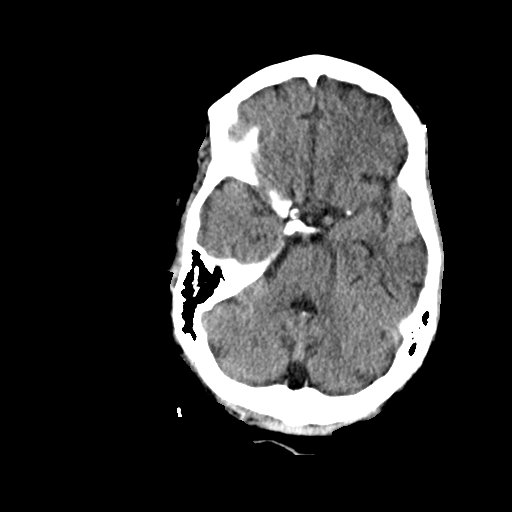
[im 12/33  brain]
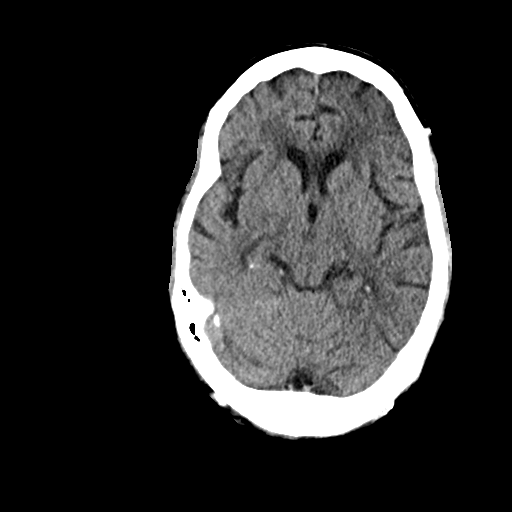
[im 15/33  brain]
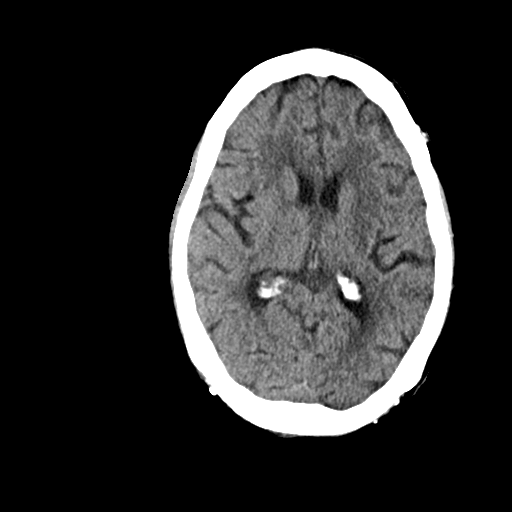
[im 15/33  bone]
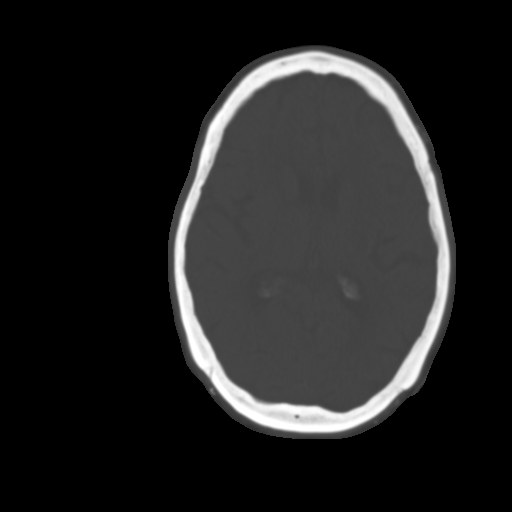
[im 18/33  brain]
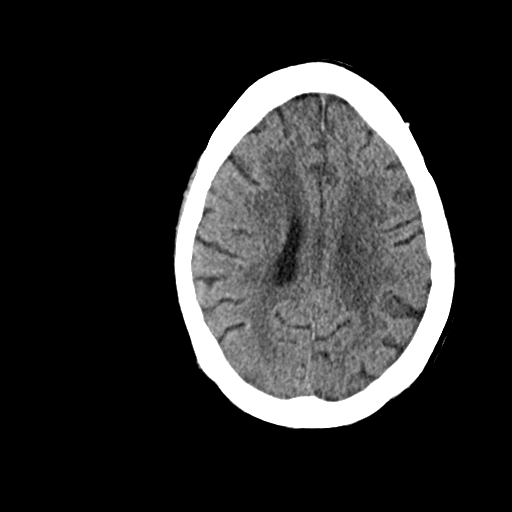
[im 21/33  brain]
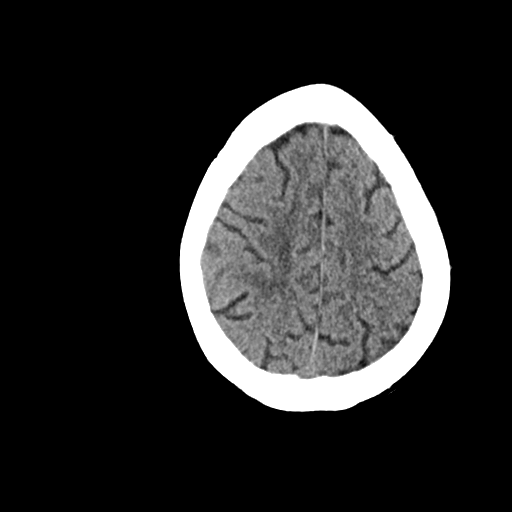
[im 25/33  brain]
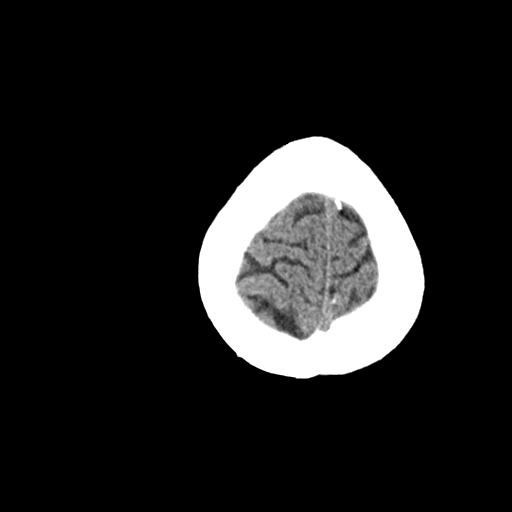
[im 27/33  brain]
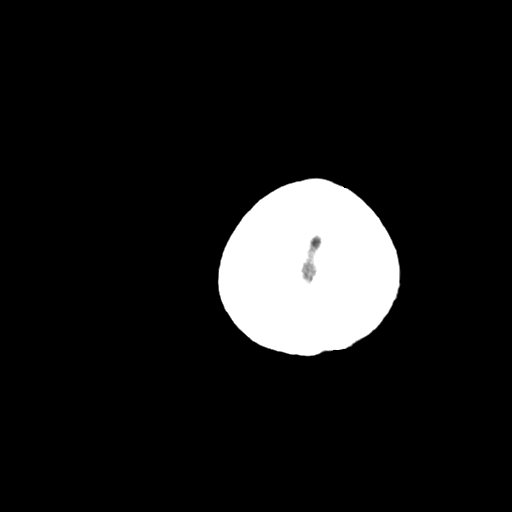
[im 27/33  bone]
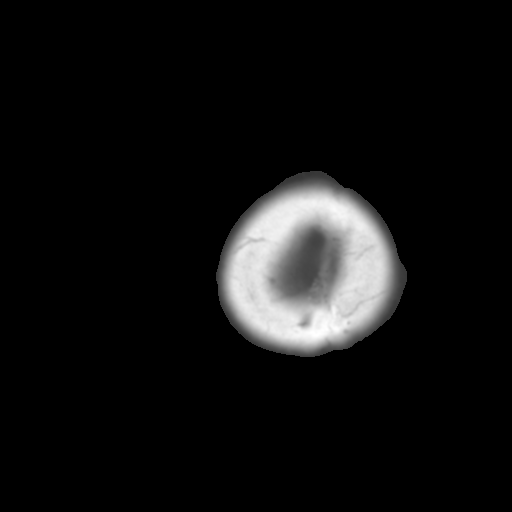
[im 30/33  brain]
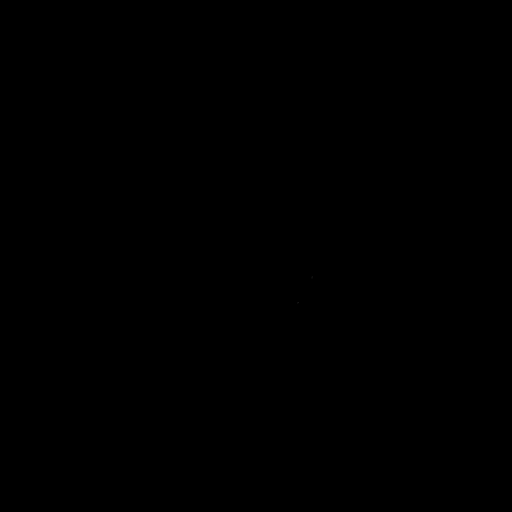

[Series 4: head 3.0 mpr cor · coronal · 0.31mm/px · 3 of 68 slices shown]
[im 23/68  brain]
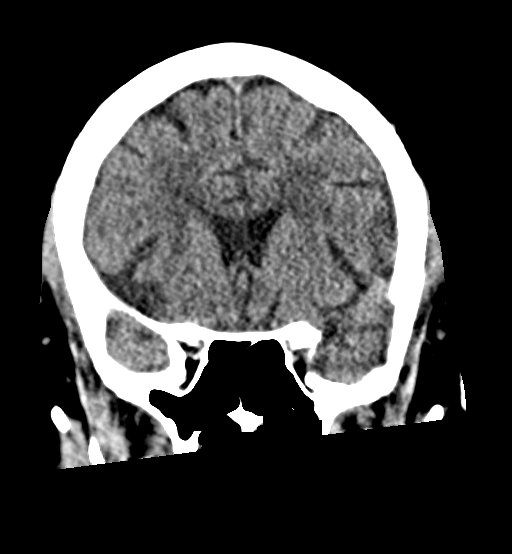
[im 30/68  brain]
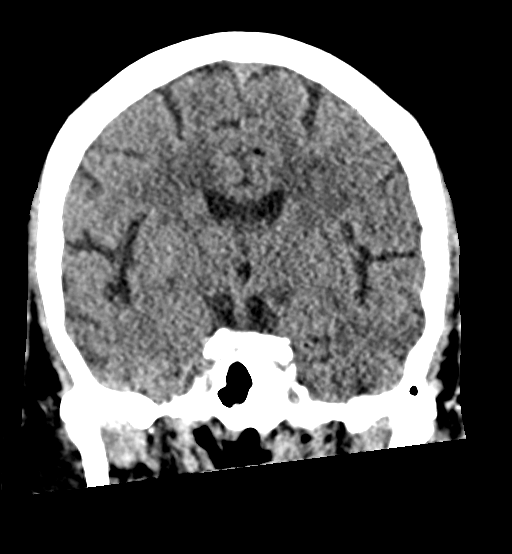
[im 38/68  brain]
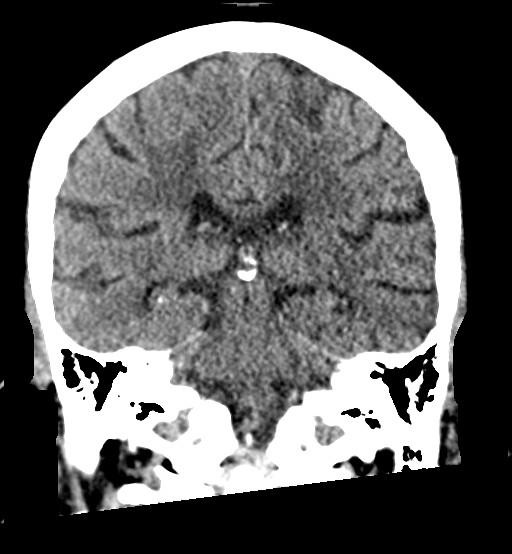

[Series 5: head 3.0 mpr sag · sagittal · 0.34mm/px · 3 of 53 slices shown]
[im 18/53  brain]
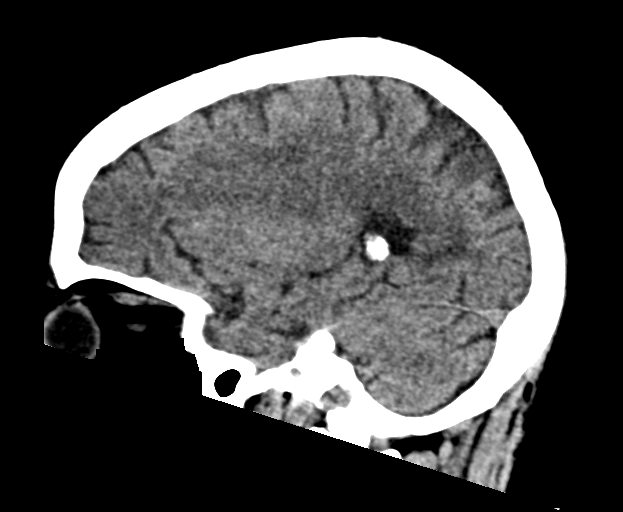
[im 27/53  brain]
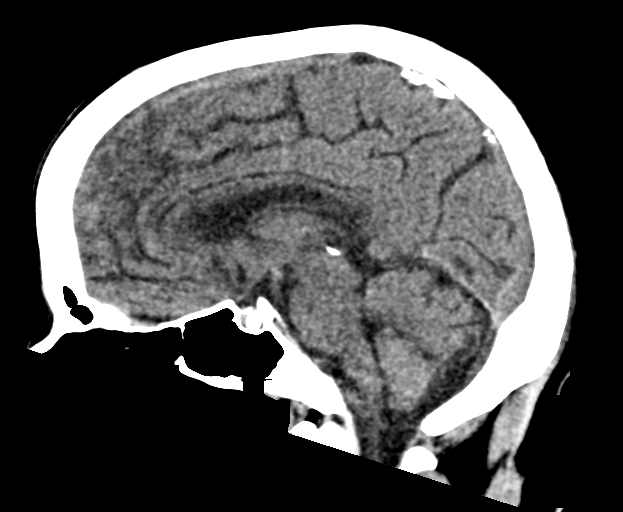
[im 35/53  brain]
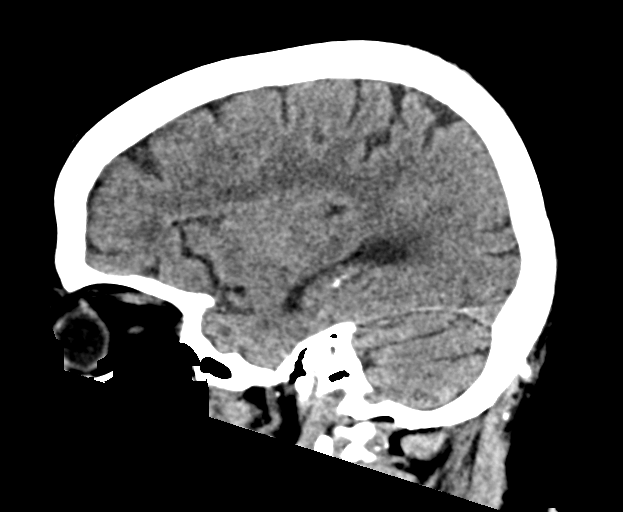

[16 of 47 positions shown; findings below may reference images not displayed]

FINDINGS: Brain: Age related volume loss. Chronic small-vessel ischemic
changes of the white matter. No evidence of acute infarction, mass
lesion, hemorrhage, hydrocephalus or extra-axial collection.

Vascular: Right carotid stent is visible. There is atherosclerotic
calcification of the major vessels at the base of the brain.

Skull: Normal

Sinuses/Orbits: Clear sinuses.  Bilateral orbital silastic implants.

Other: None
IMPRESSION: No acute intracranial finding. Chronic small-vessel ischemic changes
of the cerebral hemispheric white matter.
